# Patient Record
Sex: Male | Born: 1960 | Race: White | Hispanic: No | Marital: Married | Smoking: Current every day smoker
Health system: Southern US, Community
[De-identification: ages and names within clinical notes are randomized; demographics above are authoritative.]

## PROBLEM LIST (undated history)

## (undated) DIAGNOSIS — I10 Essential (primary) hypertension: Secondary | ICD-10-CM

## (undated) DIAGNOSIS — J939 Pneumothorax, unspecified: Secondary | ICD-10-CM

## (undated) DIAGNOSIS — E785 Hyperlipidemia, unspecified: Secondary | ICD-10-CM

## (undated) DIAGNOSIS — G44009 Cluster headache syndrome, unspecified, not intractable: Secondary | ICD-10-CM

## (undated) DIAGNOSIS — E559 Vitamin D deficiency, unspecified: Secondary | ICD-10-CM

## (undated) DIAGNOSIS — R7303 Prediabetes: Secondary | ICD-10-CM

## (undated) HISTORY — DX: Essential (primary) hypertension: I10

## (undated) HISTORY — DX: Cluster headache syndrome, unspecified, not intractable: G44.009

## (undated) HISTORY — DX: Vitamin D deficiency, unspecified: E55.9

## (undated) HISTORY — DX: Prediabetes: R73.03

## (undated) HISTORY — DX: Pneumothorax, unspecified: J93.9

## (undated) HISTORY — DX: Hyperlipidemia, unspecified: E78.5

---
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
## 2004-11-05 ENCOUNTER — Ambulatory Visit (HOSPITAL_COMMUNITY): Admission: RE | Admit: 2004-11-05 | Discharge: 2004-11-05 | Payer: Self-pay | Admitting: Internal Medicine

## 2006-02-17 HISTORY — PX: INCISE AND DRAIN ABCESS: PRO64

## 2006-04-14 ENCOUNTER — Ambulatory Visit (HOSPITAL_COMMUNITY): Admission: RE | Admit: 2006-04-14 | Discharge: 2006-04-14 | Payer: Self-pay | Admitting: Internal Medicine

## 2008-06-19 ENCOUNTER — Ambulatory Visit (HOSPITAL_COMMUNITY): Admission: RE | Admit: 2008-06-19 | Discharge: 2008-06-19 | Payer: Self-pay | Admitting: Internal Medicine

## 2010-04-28 IMAGING — CR DG CHEST 2V
2 series · 2 of 2 positions shown · non-contrast
Comparison: 04/14/2006

CLINICAL DATA: Annual physical exam.  Prior history of smoking with
cessation 3 years ago

CHEST - 2 VIEW

[view not recorded (1 of 2)]
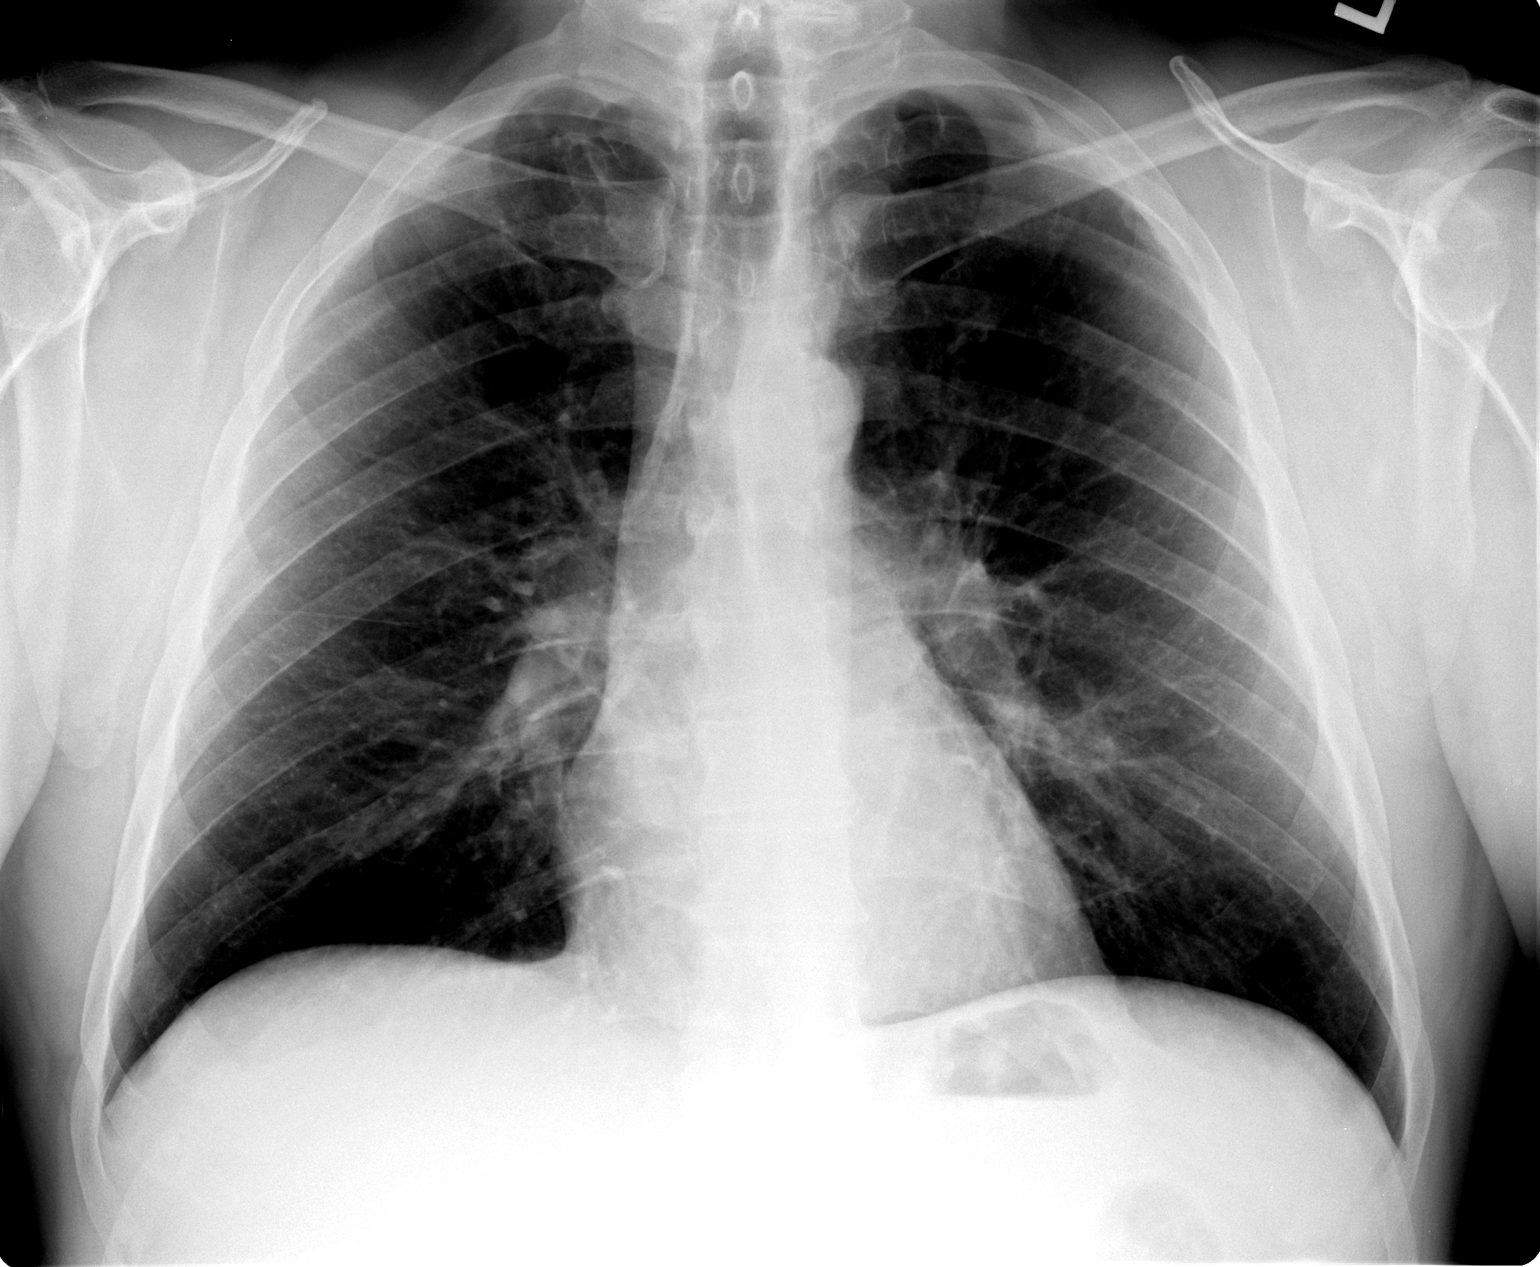

[view not recorded (2 of 2)]
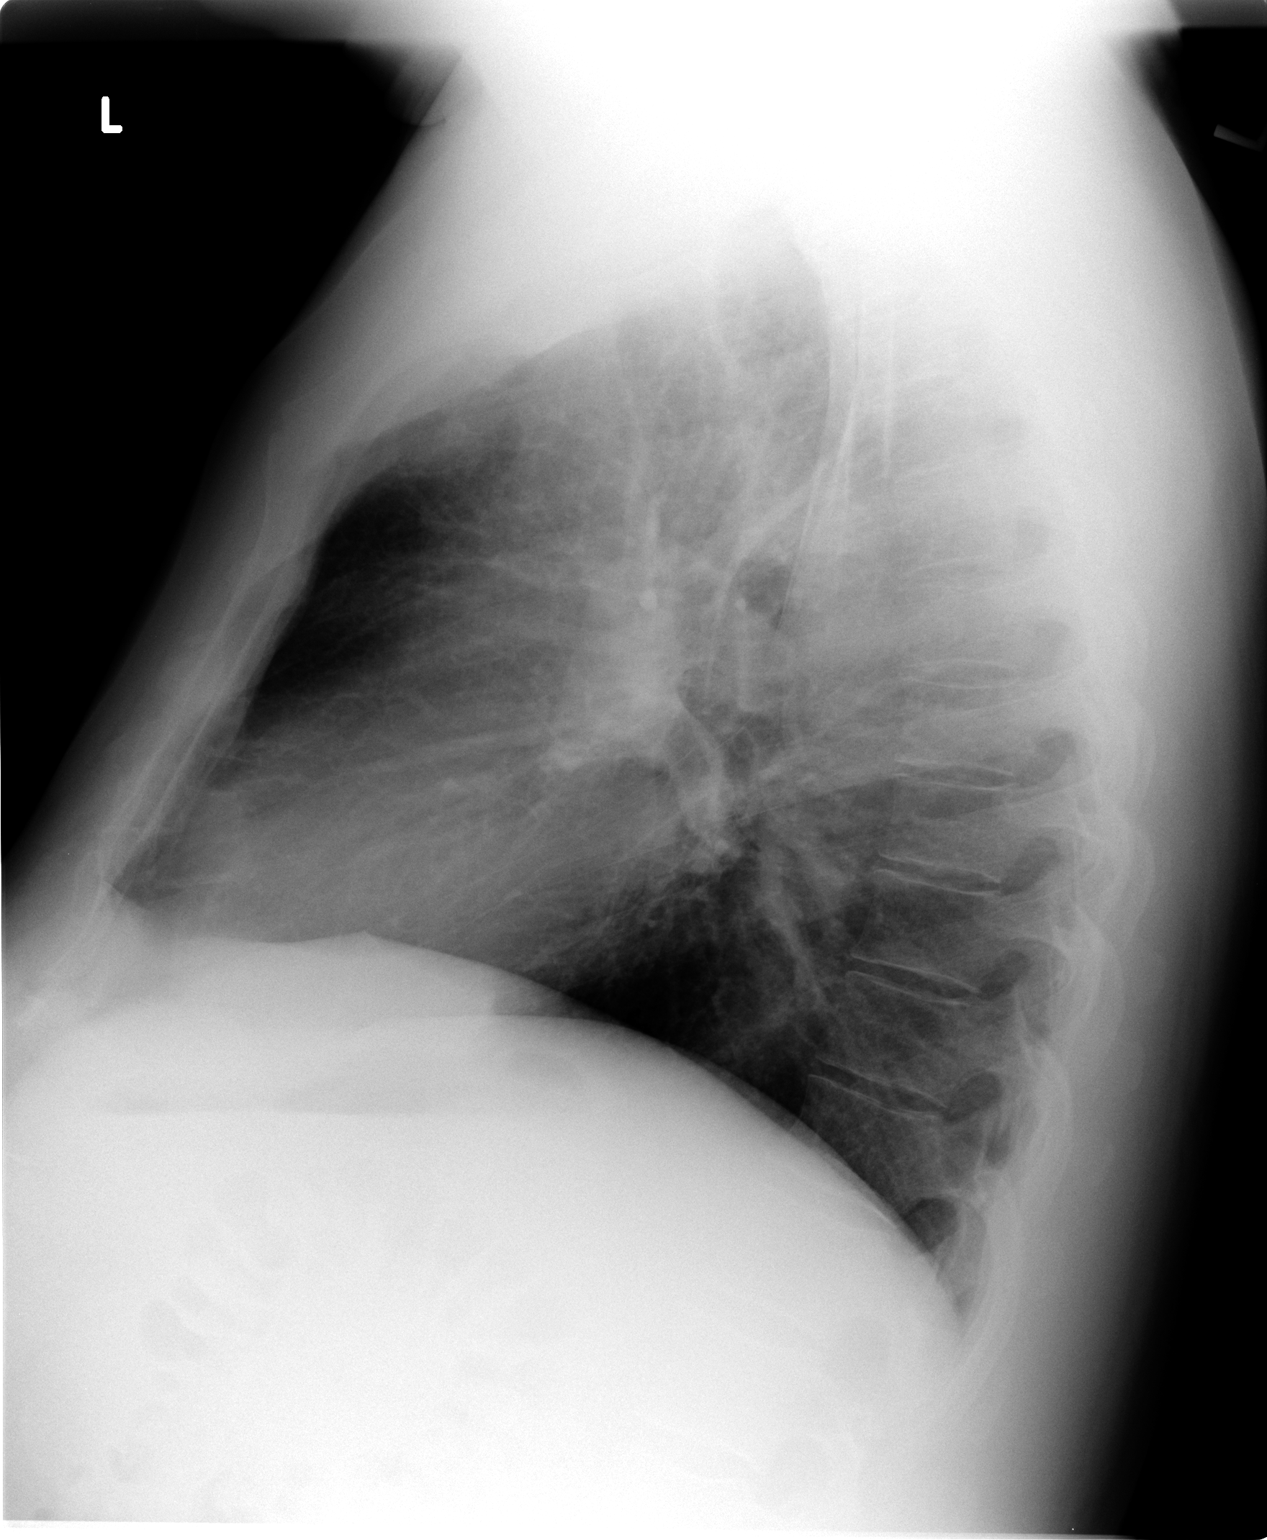

[2 of 2 positions shown; findings below may reference images not displayed]

FINDINGS: Heart and mediastinal contours are within normal limits.
The lung fields appear clear with no evidence for focal infiltrate
or congestive failure.  Bony structures appear intact.
IMPRESSION: Stable cardiopulmonary appearance with no acute disease noted.

## 2011-02-04 ENCOUNTER — Other Ambulatory Visit: Payer: Self-pay | Admitting: Internal Medicine

## 2011-02-04 DIAGNOSIS — G44009 Cluster headache syndrome, unspecified, not intractable: Secondary | ICD-10-CM

## 2011-02-05 ENCOUNTER — Ambulatory Visit
Admission: RE | Admit: 2011-02-05 | Discharge: 2011-02-05 | Disposition: A | Discharge status: Home / Self Care | Payer: BC Managed Care – PPO | Source: Ambulatory Visit | Attending: Internal Medicine | Admitting: Internal Medicine

## 2011-02-05 DIAGNOSIS — G44009 Cluster headache syndrome, unspecified, not intractable: Secondary | ICD-10-CM

## 2012-12-14 IMAGING — CT CT HEAD W/O CM
2 series · 16 of 30 positions shown, 20 images · non-contrast
Comparison: None.

CLINICAL DATA: Cluster headaches

CT HEAD WITHOUT CONTRAST
TECHNIQUE: Contiguous axial images were obtained from the base of
the skull through the vertex without contrast.

[Series 3: head bone · axial · 0.49mm/px · z∈[+40,+89]mm · 3 of 32 slices shown]
[im 3/32  bone]
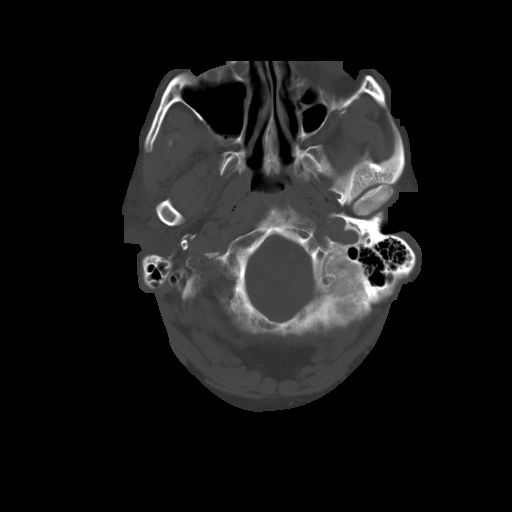
[im 7/32  bone]
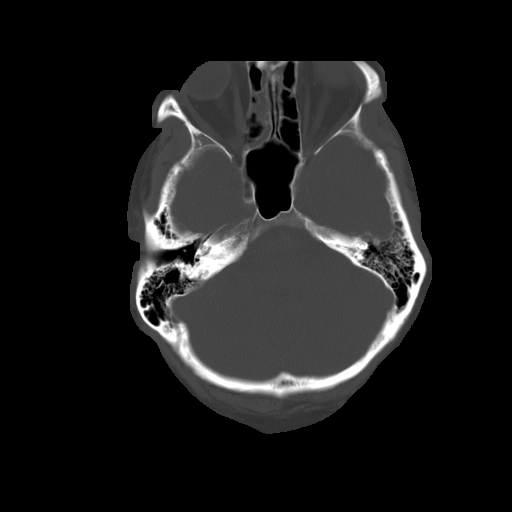
[im 12/32  bone]
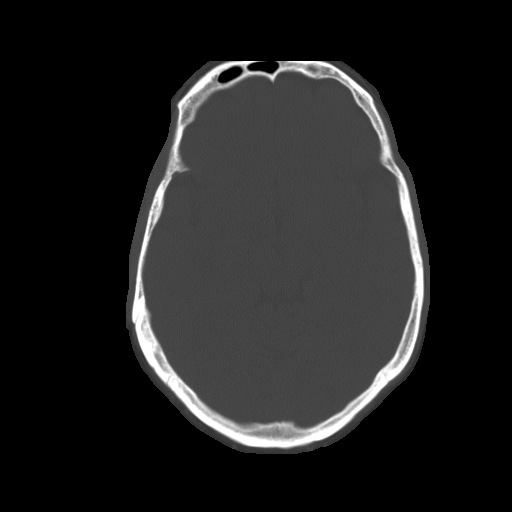

[Series 32: 3d filtered head w/o · axial · non-contrast · 0.49mm/px · z∈[+40,+182]mm · 13 of 32 slices shown, 17 images]
[im 3/32  brain]
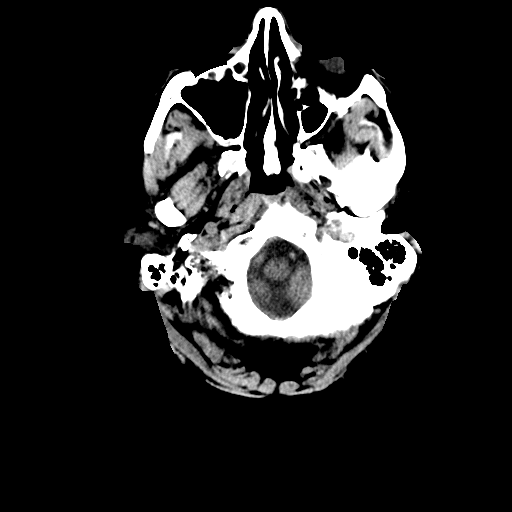
[im 3/32  bone]
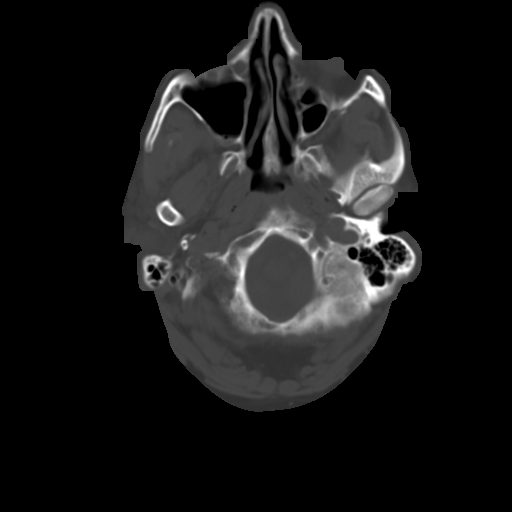
[im 5/32  brain]
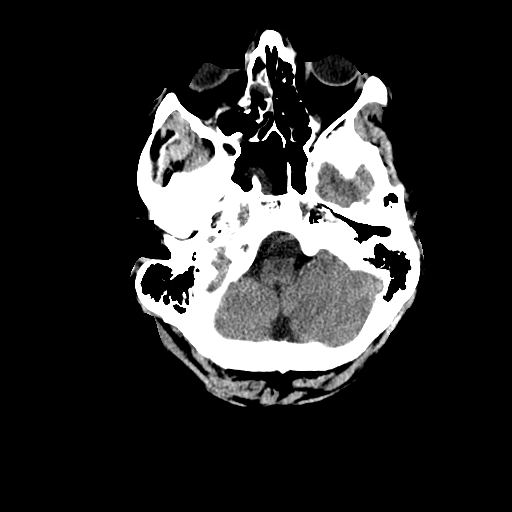
[im 7/32  brain]
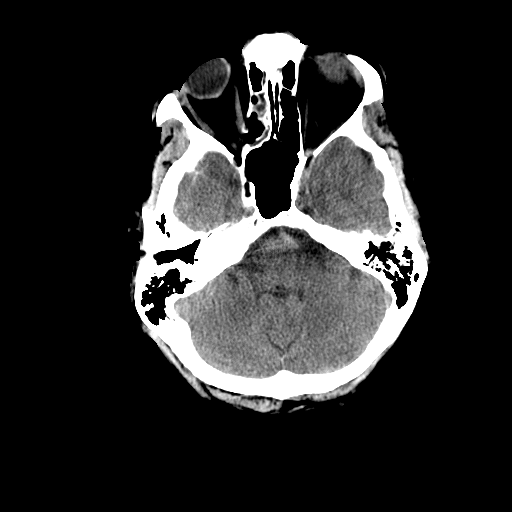
[im 9/32  brain]
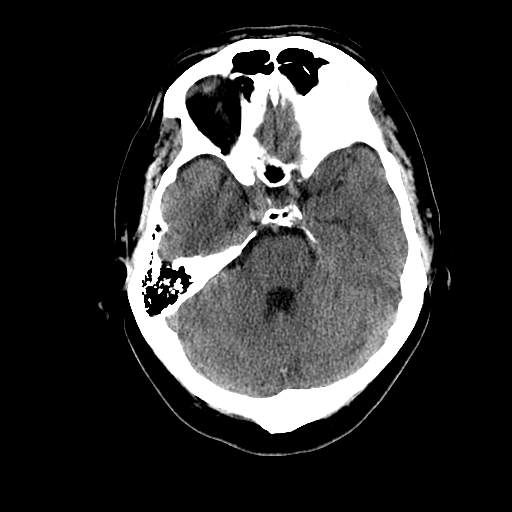
[im 12/32  brain]
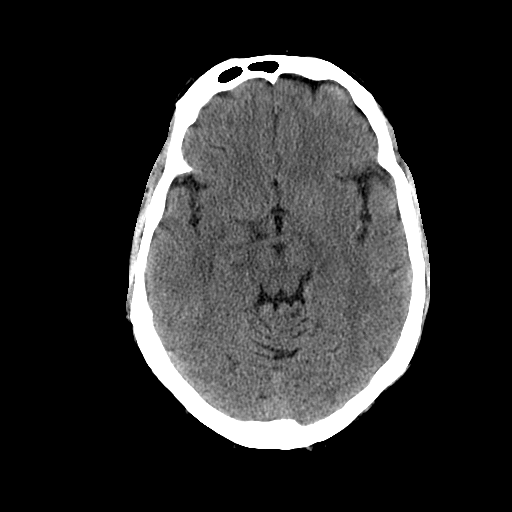
[im 12/32  bone]
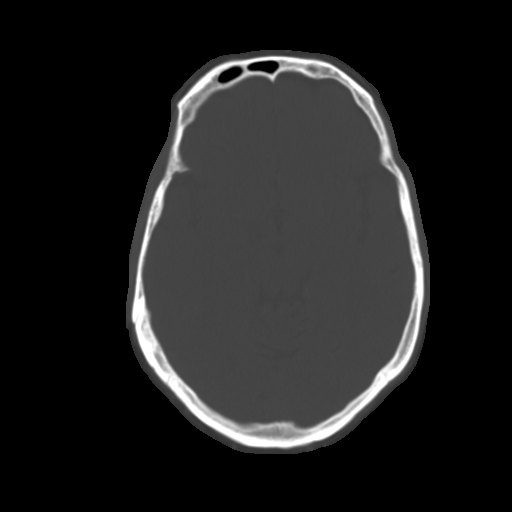
[im 14/32  brain]
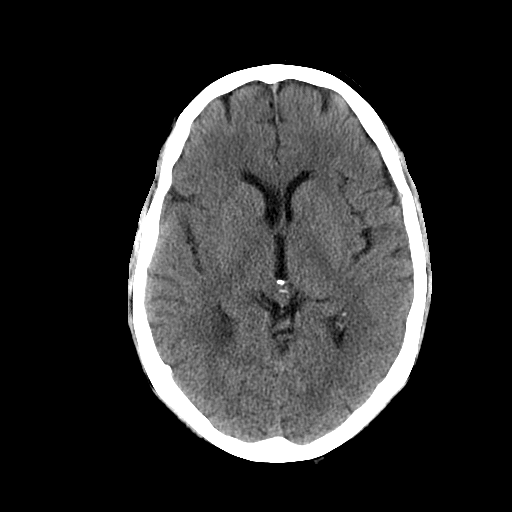
[im 16/32  brain]
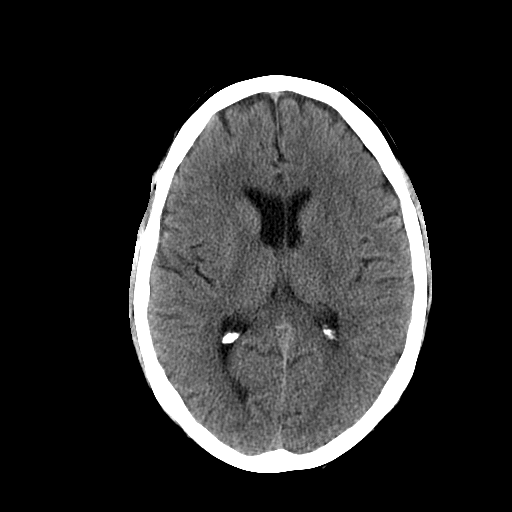
[im 18/32  brain]
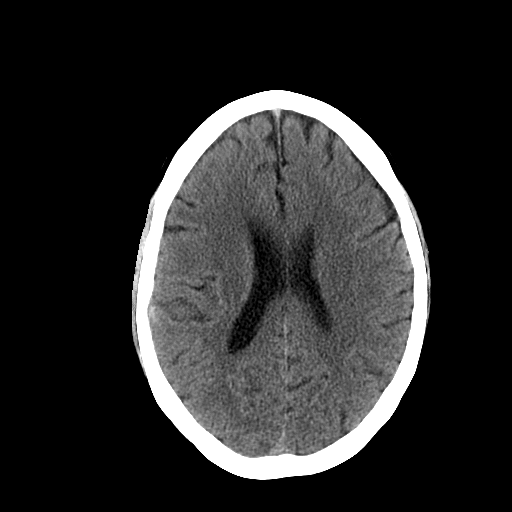
[im 20/32  brain]
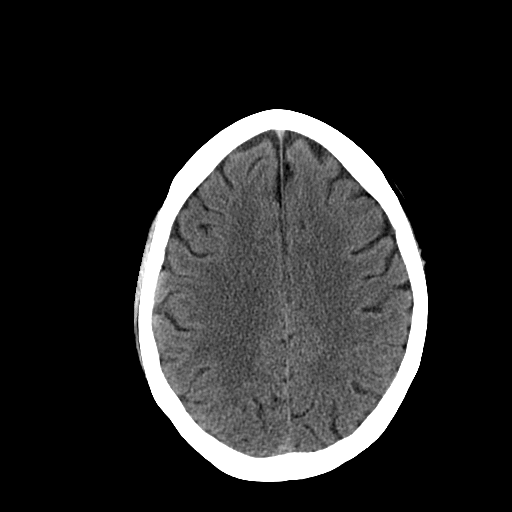
[im 20/32  bone]
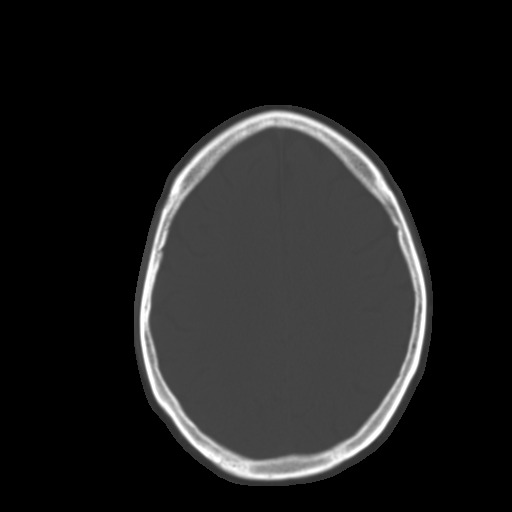
[im 23/32  brain]
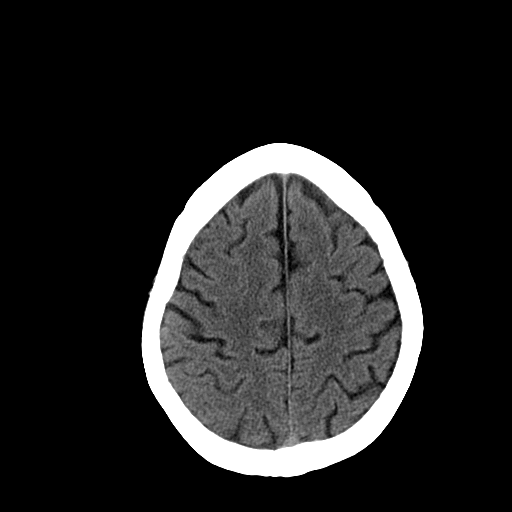
[im 25/32  brain]
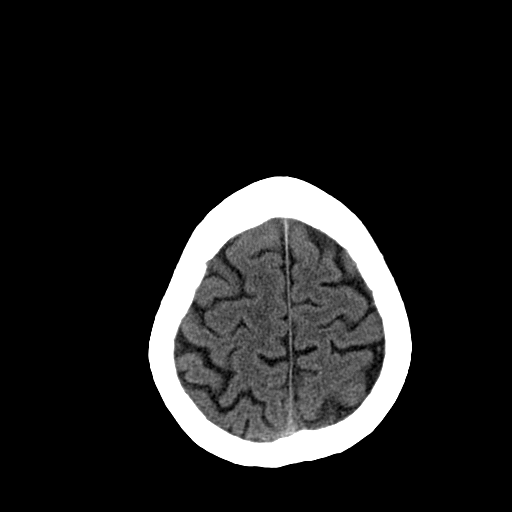
[im 27/32  brain]
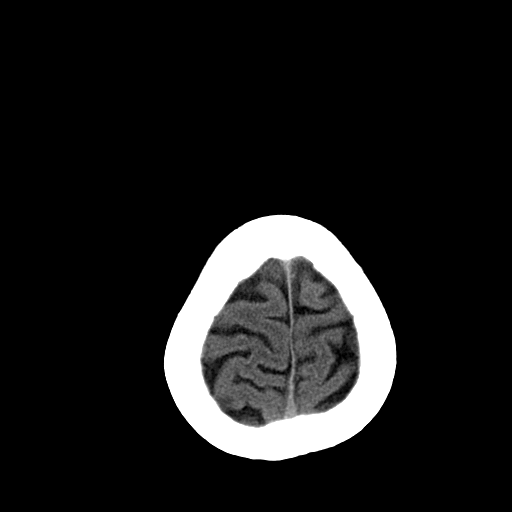
[im 29/32  brain]
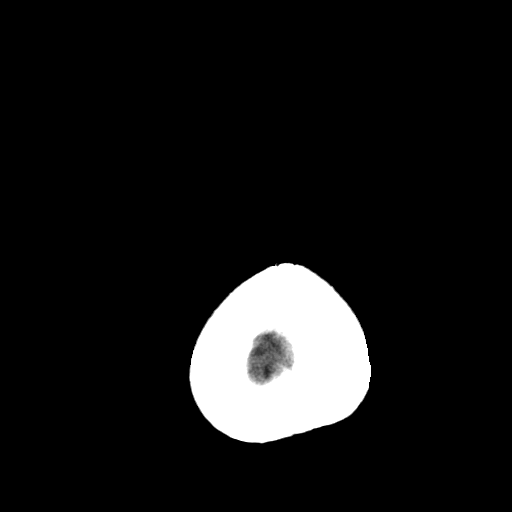
[im 29/32  bone]
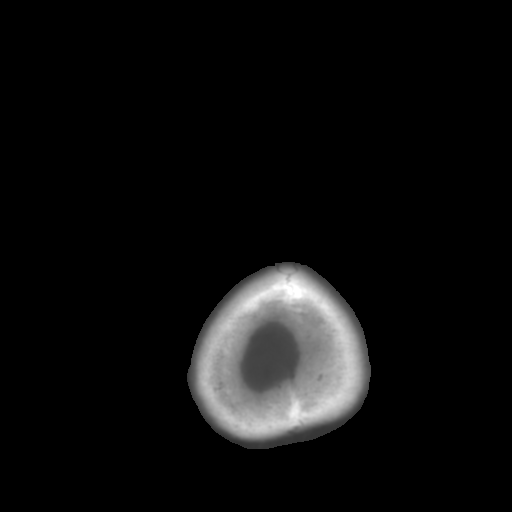

[16 of 30 positions shown; findings below may reference images not displayed]

FINDINGS: Ventricle size is normal.  Negative for intracranial
hemorrhage.  Negative for infarct or mass lesion.  Hypodensity
right basal ganglia compatible with chorioid fissure cyst.  This
appears benign.

Calvarium is intact.  Mucosal thickening in the sphenoid sinus and
right ethmoid sinus. Small air-fluid level right maxillary sinus.
IMPRESSION: No significant intracranial abnormality.

## 2013-02-07 ENCOUNTER — Other Ambulatory Visit: Payer: Self-pay | Admitting: Internal Medicine

## 2013-02-07 ENCOUNTER — Other Ambulatory Visit: Payer: Self-pay | Admitting: Physician Assistant

## 2013-02-07 MED ORDER — SILDENAFIL CITRATE 100 MG PO TABS: 100.0000 mg | ORAL_TABLET | ORAL | Status: DC | PRN

## 2013-02-07 MED ORDER — VERAPAMIL HCL ER 240 MG PO TBCR: 240.0000 mg | EXTENDED_RELEASE_TABLET | Freq: Every day | ORAL | Status: DC

## 2013-03-14 DIAGNOSIS — I10 Essential (primary) hypertension: Secondary | ICD-10-CM | POA: Insufficient documentation

## 2013-03-14 DIAGNOSIS — E785 Hyperlipidemia, unspecified: Secondary | ICD-10-CM | POA: Insufficient documentation

## 2013-03-14 DIAGNOSIS — R7309 Other abnormal glucose: Secondary | ICD-10-CM | POA: Insufficient documentation

## 2013-03-14 DIAGNOSIS — G44009 Cluster headache syndrome, unspecified, not intractable: Secondary | ICD-10-CM | POA: Insufficient documentation

## 2013-03-14 DIAGNOSIS — R7303 Prediabetes: Secondary | ICD-10-CM | POA: Insufficient documentation

## 2013-03-15 ENCOUNTER — Encounter: Payer: Self-pay | Admitting: Emergency Medicine

## 2013-03-15 ENCOUNTER — Ambulatory Visit (INDEPENDENT_AMBULATORY_CARE_PROVIDER_SITE_OTHER): Payer: BC Managed Care – PPO | Admitting: Emergency Medicine

## 2013-03-15 VITALS — BP 138/84 | HR 82 | Temp 98.4°F | Resp 18 | Ht 70.5 in | Wt 192.0 lb

## 2013-03-15 DIAGNOSIS — G4700 Insomnia, unspecified: Secondary | ICD-10-CM

## 2013-03-15 DIAGNOSIS — R5381 Other malaise: Secondary | ICD-10-CM

## 2013-03-15 DIAGNOSIS — J309 Allergic rhinitis, unspecified: Secondary | ICD-10-CM

## 2013-03-15 DIAGNOSIS — J329 Chronic sinusitis, unspecified: Secondary | ICD-10-CM

## 2013-03-15 DIAGNOSIS — R6883 Chills (without fever): Secondary | ICD-10-CM

## 2013-03-15 DIAGNOSIS — R5383 Other fatigue: Secondary | ICD-10-CM

## 2013-03-15 MED ORDER — ALPRAZOLAM 0.5 MG PO TABS: 0.5000 mg | ORAL_TABLET | Freq: Every evening | ORAL | Status: AC | PRN

## 2013-03-15 MED ORDER — AZELASTINE HCL 0.1 % NA SOLN: 1.0000 | Freq: Two times a day (BID) | NASAL | Status: DC

## 2013-03-15 MED ORDER — AZITHROMYCIN 250 MG PO TABS: ORAL_TABLET | ORAL | Status: AC

## 2013-03-15 MED ORDER — PREDNISONE 10 MG PO TABS: ORAL_TABLET | ORAL | Status: DC

## 2013-03-15 MED ORDER — FLUTICASONE PROPIONATE 50 MCG/ACT NA SUSP: 1.0000 | Freq: Every day | NASAL | Status: DC

## 2013-03-15 NOTE — Progress Notes (Signed)
Subjective:    Patient ID: Keith Castro, male    DOB: 02/06/1961, 53 y.o.   MRN: [id number]  HPI Comments: 53 yo male with Right side pain in sinus x 1week. He has increased neon yellow production. He has been on multiple nose sprays without relief. He uses afrin routinely.   He has had difficulty falling asleep and staying asleep for years. He drinks ETOH occasional. He tried Oaxzepam 30 mg in past made him too sleepy. He takes occasionally xanax .5 from wife which helps and does not leave him groggy in the a.m.     Medication List       This list is accurate as of: 03/15/13 11:59 PM.  Always use your most recent med list.               ALPRAZolam 0.5 MG tablet  Commonly known as:  XANAX  Take 1 tablet (0.5 mg total) by mouth at bedtime as needed for anxiety or sleep.     azelastine 137 MCG/SPRAY nasal spray  Commonly known as:  ASTELIN  Place 1 spray into both nostrils 2 (two) times daily. Use in each nostril as directed     azithromycin 250 MG tablet  Commonly known as:  ZITHROMAX  Take 2 tablets (500 mg) on  Day 1,  followed by 1 tablet (250 mg) once daily on Days 2 through 5.     FISH OIL PO  Take by mouth daily.     FLAXSEED OIL PO  Take by mouth daily.     fluticasone 50 MCG/ACT nasal spray  Commonly known as:  FLONASE  Place 1 spray into both nostrils daily.     predniSONE 10 MG tablet  Commonly known as:  DELTASONE  1 po TID x 3 days, 1 PO BID x 3 days, 1 po QD x 5 days     RED YEAST RICE PO  Take by mouth daily.     sildenafil 100 MG tablet  Commonly known as:  VIAGRA  Take 1 tablet (100 mg total) by mouth as needed for erectile dysfunction.     SUMAtriptan 20 MG/ACT nasal spray  Commonly known as:  IMITREX  Place 20 mg into the nose every 2 (two) hours as needed for migraine or headache. May repeat in 2 hours if headache persists or recurs.     verapamil 240 MG CR tablet  Commonly known as:  CALAN-SR  Take 1 tablet (240 mg total) by mouth  daily.     VITAMIN D PO  Take 6,000 Int'l Units by mouth daily.     ZYRTEC ALLERGY 10 MG tablet  Generic drug:  cetirizine  Take 10 mg by mouth daily.       ALLERGIES Topamax  Past Medical History  Diagnosis Date  . Hyperlipidemia   . Hypertension   . Prediabetes   . Vitamin D deficiency   . Cluster headache      Review of Systems  Constitutional: Positive for fever.  HENT: Positive for congestion and sinus pressure.   Respiratory: Positive for cough.   Psychiatric/Behavioral: Positive for sleep disturbance.  All other systems reviewed and are negative.   BP 138/84  Pulse 82  Temp(Src) 98.4 F (36.9 C) (Temporal)  Resp 18  Ht 5' 10.5" (1.791 m)  Wt 192 lb (87.091 kg)  BMI 27.15 kg/m2     Objective:   Physical Exam  Nursing note and vitals reviewed. Constitutional: He is oriented to person, place, and  time. He appears well-developed and well-nourished.  HENT:  Head: Normocephalic and atraumatic.  Right Ear: External ear normal.  Left Ear: External ear normal.  Nose: Nose normal.  Mouth/Throat: Oropharynx is clear and moist. No oropharyngeal exudate.  Right maxillary/ frontal tenderness and yellow TM  Eyes: Conjunctivae are normal.  Neck: Normal range of motion.  Cardiovascular: Normal rate, regular rhythm, normal heart sounds and intact distal pulses.   Pulmonary/Chest: Effort normal and breath sounds normal.  Abdominal: Soft.  Musculoskeletal: Normal range of motion.  Lymphadenopathy:    He has no cervical adenopathy.  Neurological: He is alert and oriented to person, place, and time.  Skin: Skin is warm and dry.  Psychiatric: He has a normal mood and affect. Judgment normal.          Assessment & Plan:  1. Sinusitis/ Allergic rhinitis-  Switch to Allegra OTC, increase H2o, allergy hygiene explained. Zpak, Pred dp 10mg  AD. Astepro/ Flonase NS AD, D/C Afrin 2. Insomnia- Sleep hygiene discussed, increase daytime activity level call if no  improvement, consider sleep study Xanax .5 for sleep AD, do not take with ETOH or other pain Rx

## 2013-03-15 NOTE — Patient Instructions (Signed)
Allergic Rhinitis Allergic rhinitis is when the mucous membranes in the nose respond to allergens. Allergens are particles in the air that cause your body to have an allergic reaction. This causes you to release allergic antibodies. Through a chain of events, these eventually cause you to release histamine into the blood stream. Although meant to protect the body, it is this release of histamine that causes your discomfort, such as frequent sneezing, congestion, and an itchy, runny nose.  CAUSES  Seasonal allergic rhinitis (hay fever) is caused by pollen allergens that may come from grasses, trees, and weeds. Year-round allergic rhinitis (perennial allergic rhinitis) is caused by allergens such as house dust mites, pet dander, and mold spores.  SYMPTOMS   Nasal stuffiness (congestion).  Itchy, runny nose with sneezing and tearing of the eyes. DIAGNOSIS  Your health care provider can help you determine the allergen or allergens that trigger your symptoms. If you and your health care provider are unable to determine the allergen, skin or blood testing may be used. TREATMENT  Allergic Rhinitis does not have a cure, but it can be controlled by:  Medicines and allergy shots (immunotherapy).  Avoiding the allergen. Hay fever may often be treated with antihistamines in pill or nasal spray forms. Antihistamines block the effects of histamine. There are over-the-counter medicines that may help with nasal congestion and swelling around the eyes. Check with your health care provider before taking or giving this medicine.  If avoiding the allergen or the medicine prescribed do not work, there are many new medicines your health care provider can prescribe. Stronger medicine may be used if initial measures are ineffective. Desensitizing injections can be used if medicine and avoidance does not work. Desensitization is when a patient is given ongoing shots until the body becomes less sensitive to the allergen.  Make sure you follow up with your health care provider if problems continue. HOME CARE INSTRUCTIONS It is not possible to completely avoid allergens, but you can reduce your symptoms by taking steps to limit your exposure to them. It helps to know exactly what you are allergic to so that you can avoid your specific triggers. SEEK MEDICAL CARE IF:   You have a fever.  You develop a cough that does not stop easily (persistent).  You have shortness of breath.  You start wheezing.  Symptoms interfere with normal daily activities. Document Released: 10/29/2000 Document Revised: 11/24/2012 Document Reviewed: 10/11/2012 [employer] Patient Information 2014 [employer]. Sinusitis Sinusitis is redness, soreness, and puffiness (inflammation) of the air pockets in the bones of your face (sinuses). The redness, soreness, and puffiness can cause air and mucus to get trapped in your sinuses. This can allow germs to grow and cause an infection.  HOME CARE   Drink enough fluids to keep your pee (urine) clear or pale yellow.  Use a humidifier in your home.  Run a hot shower to create steam in the bathroom. Sit in the bathroom with the door closed. Breathe in the steam 3 4 times a day.  Put a warm, moist washcloth on your face 3 4 times a day, or as told by your doctor.  Use salt water sprays (saline sprays) to wet the thick fluid in your nose. This can help the sinuses drain.  Only take medicine as told by your doctor. GET HELP RIGHT AWAY IF:   Your pain gets worse.  You have very bad headaches.  You are sick to your stomach (nauseous).  You throw up (vomit).    You are very sleepy (drowsy) all the time.  Your face is puffy (swollen).  Your vision changes.  You have a stiff neck.  You have trouble breathing. MAKE SURE YOU:   Understand these instructions.  Will watch your condition.  Will get help right away if you are not doing well or get worse. Document Released: 07/23/2007  Document Revised: 10/29/2011 Document Reviewed: 09/09/2011 [employer] Patient Information 2014 [employer].  

## 2013-03-31 ENCOUNTER — Ambulatory Visit (INDEPENDENT_AMBULATORY_CARE_PROVIDER_SITE_OTHER): Payer: BC Managed Care – PPO | Admitting: Internal Medicine

## 2013-03-31 ENCOUNTER — Encounter: Payer: Self-pay | Admitting: Internal Medicine

## 2013-03-31 VITALS — BP 124/80 | HR 72 | Temp 98.6°F | Resp 16 | Wt 199.6 lb

## 2013-03-31 DIAGNOSIS — R7303 Prediabetes: Secondary | ICD-10-CM

## 2013-03-31 DIAGNOSIS — R7309 Other abnormal glucose: Secondary | ICD-10-CM

## 2013-03-31 DIAGNOSIS — E785 Hyperlipidemia, unspecified: Secondary | ICD-10-CM

## 2013-03-31 DIAGNOSIS — I10 Essential (primary) hypertension: Secondary | ICD-10-CM

## 2013-03-31 DIAGNOSIS — E559 Vitamin D deficiency, unspecified: Secondary | ICD-10-CM

## 2013-03-31 DIAGNOSIS — Z79899 Other long term (current) drug therapy: Secondary | ICD-10-CM | POA: Insufficient documentation

## 2013-03-31 DIAGNOSIS — E782 Mixed hyperlipidemia: Secondary | ICD-10-CM

## 2013-03-31 LAB — CBC WITH DIFFERENTIAL/PLATELET
BASOS ABS: 0.1 10*3/uL (ref 0.0–0.1)
Basophils Relative: 1 % (ref 0–1)
EOS ABS: 0.1 10*3/uL (ref 0.0–0.7)
EOS PCT: 2 % (ref 0–5)
HCT: 42.5 % (ref 39.0–52.0)
HEMOGLOBIN: 14.9 g/dL (ref 13.0–17.0)
Lymphocytes Relative: 26 % (ref 12–46)
Lymphs Abs: 2.1 10*3/uL (ref 0.7–4.0)
MCH: 32.5 pg (ref 26.0–34.0)
MCHC: 35.1 g/dL (ref 30.0–36.0)
MCV: 92.6 fL (ref 78.0–100.0)
Monocytes Absolute: 1 10*3/uL (ref 0.1–1.0)
Monocytes Relative: 12 % (ref 3–12)
Neutro Abs: 4.8 10*3/uL (ref 1.7–7.7)
Neutrophils Relative %: 59 % (ref 43–77)
Platelets: 323 10*3/uL (ref 150–400)
RBC: 4.59 MIL/uL (ref 4.22–5.81)
RDW: 13.3 % (ref 11.5–15.5)
WBC: 8.1 10*3/uL (ref 4.0–10.5)

## 2013-03-31 NOTE — Progress Notes (Signed)
Patient ID: Keith Castro FindersBobby Castro, male   DOB: 07/01/1960, 53 y.o.   MRN: [id number]   This very nice 53 y.o. MWM presents for 3 month follow up with Hypertension, Hyperlipidemia, Pre-Diabetes and Vitamin D Deficiency.    HTN predates since 2004. BP has been controlled at home. Today's BP: 124/80 mmHg . Patient denies any cardiac type chest pain, palpitations, dyspnea/orthopnea/PND, dizziness, claudication, or dependent edema.   Hyperlipidemia is controlled with diet & meds. Last Cholesterol was 172, Triglycerides were 241, HDL 48 and LDL 76 in Aug 2014 -0 at goal. Patient denies myalgias or other med SE's.   Also, the patient has history of PreDiabetes with A1c 5.7% in 2011 and with last A1c of 5.3% in Aug 2014   . Patient denies any symptoms of reactive hypoglycemia, diabetic polys, paresthesias or visual blurring.   Further, Patient has history of Vitamin D Deficiency of 4333 in 2008and with last vitamin D of 100 in Aug 2014. Patient supplements vitamin D without any suspected side-effects.    Medication List       ALPRAZolam 0.5 MG tablet  Commonly known as:  XANAX  Take 1 tablet (0.5 mg total) by mouth at bedtime as needed for anxiety or sleep.     aspirin 325 MG tablet  Take 325 mg by mouth daily.     azelastine 137 MCG/SPRAY nasal spray  Commonly known as:  ASTELIN  Place 1 spray into both nostrils 2 (two) times daily. Use in each nostril as directed     FISH OIL PO  Take by mouth daily.     FLAXSEED OIL PO  Take by mouth daily.     fluticasone 50 MCG/ACT nasal spray  Commonly known as:  FLONASE  Place 1 spray into both nostrils daily.     meloxicam 15 MG tablet  Commonly known as:  MOBIC  Take 15 mg by mouth daily.     RED YEAST RICE PO  Take by mouth daily.     sildenafil 100 MG tablet  Commonly known as:  VIAGRA  Take 1 tablet (100 mg total) by mouth as needed for erectile dysfunction.     SUMAtriptan 20 MG/ACT nasal spray  Commonly known as:  IMITREX  Place 20 mg  into the nose every 2 (two) hours as needed for migraine or headache. May repeat in 2 hours if headache persists or recurs.     verapamil 240 MG CR tablet  Commonly known as:  CALAN-SR  Take 1 tablet (240 mg total) by mouth daily.     VITAMIN D PO  Take 6,000 Int'l Units by mouth daily.     ZYRTEC ALLERGY 10 MG tablet  Generic drug:  cetirizine  Take 10 mg by mouth daily.         Allergies  Allergen Reactions  . Topamax [Topiramate]     PMHx:   Past Medical History  Diagnosis Date  . Hyperlipidemia   . Hypertension   . Prediabetes   . Vitamin D deficiency   . Cluster headache     FHx:    Reviewed / unchanged  SHx:    Reviewed / unchanged  Systems Review: Constitutional: Denies fever, chills, wt changes, headaches, insomnia, fatigue, night sweats, change in appetite. Eyes: Denies redness, blurred vision, diplopia, discharge, itchy, watery eyes.  ENT: Denies discharge, congestion, post nasal drip, epistaxis, sore throat, earache, hearing loss, dental pain, tinnitus, vertigo, sinus pain, snoring.  CV: Denies chest pain, palpitations, irregular heartbeat, syncope, dyspnea, diaphoresis,  orthopnea, PND, claudication, edema. Respiratory: denies cough, dyspnea, DOE, pleurisy, hoarseness, laryngitis, wheezing.  Gastrointestinal: Denies dysphagia, odynophagia, heartburn, reflux, water brash, abdominal pain or cramps, nausea, vomiting, bloating, diarrhea, constipation, hematemesis, melena, hematochezia,  or hemorrhoids. Genitourinary: Denies dysuria, frequency, urgency, nocturia, hesitancy, discharge, hematuria, flank pain. Musculoskeletal: Denies arthralgias, myalgias, stiffness, jt. swelling, pain, limp, strain/sprain.  Skin: Denies pruritus, rash, hives, warts, acne, eczema, change in skin lesion(s). Neuro: No weakness, tremor, incoordination, spasms, paresthesia, or pain. Psychiatric: Denies confusion, memory loss, or sensory loss. Endo: Denies change in weight, skin, hair  change.  Heme/Lymph: No excessive bleeding, bruising, orenlarged lymph nodes.  BP: 124/80  Pulse: 72  Temp: 98.6 F (37 C)  Resp: 16    Estimated body mass index is 28.23 kg/(m^2) as calculated from the following:   Height as of 03/15/13: 5' 10.5" (1.791 m).   Weight as of this encounter: 199 lb 9.6 oz (90.538 kg).  On Exam: Appears well nourished - in no distress. Eyes: PERRLA, EOMs, conjunctiva no swelling or erythema. Sinuses: No frontal/maxillary tenderness ENT/Mouth: EAC's clear, TM's nl w/o erythema, bulging. Nares clear w/o erythema, swelling, exudates. Oropharynx clear without erythema or exudates. Oral hygiene is good. Tongue normal, non obstructing. Hearing intact.  Neck: Supple. Thyroid nl. Car 2+/2+ without bruits, nodes or JVD. Chest: Respirations nl with BS clear & equal w/o rales, rhonchi, wheezing or stridor.  Cor: Heart sounds normal w/ regular rate and rhythm without sig. murmurs, gallops, clicks, or rubs. Peripheral pulses normal and equal  without edema.  Abdomen: Soft & bowel sounds normal. Non-tender w/o guarding, rebound, hernias, masses, or organomegaly.  Lymphatics: Unremarkable.  Musculoskeletal: Full ROM all peripheral extremities, joint stability, 5/5 strength, and normal gait.  Skin: Warm, dry without exposed rashes, lesions, ecchymosis apparent.  Neuro: Cranial nerves intact, reflexes equal bilaterally. Sensory-motor testing grossly intact. Tendon reflexes grossly intact.  Pysch: Alert & oriented x 3. Insight and judgement nl & appropriate. No ideations.  Assessment and Plan:  1. Hypertension - Continue monitor blood pressure at home. Continue diet/meds same.  2. Hyperlipidemia - Continue diet/meds, exercise,& lifestyle modifications. Continue monitor periodic cholesterol/liver & renal functions   3. Pre-diabetes - Continue diet, exercise, lifestyle modifications. Monitor appropriate labs.  4. Vitamin D Deficiency - Continue  supplementation.  Recommended regular exercise, BP monitoring, weight control, and discussed med and SE's. Recommended labs to assess and monitor clinical status. Further disposition pending results of labs.

## 2013-03-31 NOTE — Patient Instructions (Signed)

## 2013-04-01 LAB — VITAMIN D 25 HYDROXY (VIT D DEFICIENCY, FRACTURES): Vit D, 25-Hydroxy: 100 ng/mL — ABNORMAL HIGH (ref 30–89)

## 2013-04-01 LAB — LIPID PANEL
CHOLESTEROL: 185 mg/dL (ref 0–200)
HDL: 49 mg/dL (ref >39)
LDL Cholesterol: 97 mg/dL (ref 0–99)
Total CHOL/HDL Ratio: 3.8 Ratio
Triglycerides: 196 mg/dL — ABNORMAL HIGH (ref <150)
VLDL: 39 mg/dL (ref 0–40)

## 2013-04-01 LAB — TSH: TSH: 3.843 u[IU]/mL (ref 0.350–4.500)

## 2013-04-01 LAB — HEPATIC FUNCTION PANEL
ALBUMIN: 4.5 g/dL (ref 3.5–5.2)
ALK PHOS: 59 U/L (ref 39–117)
ALT: 54 U/L — ABNORMAL HIGH (ref 0–53)
AST: 26 U/L (ref 0–37)
BILIRUBIN DIRECT: 0.1 mg/dL (ref 0.0–0.3)
BILIRUBIN INDIRECT: 0.7 mg/dL (ref 0.2–1.2)
BILIRUBIN TOTAL: 0.8 mg/dL (ref 0.2–1.2)
Total Protein: 6.8 g/dL (ref 6.0–8.3)

## 2013-04-01 LAB — BASIC METABOLIC PANEL WITH GFR
BUN: 15 mg/dL (ref 6–23)
CALCIUM: 9.6 mg/dL (ref 8.4–10.5)
CO2: 23 mEq/L (ref 19–32)
CREATININE: 0.92 mg/dL (ref 0.50–1.35)
Chloride: 107 mEq/L (ref 96–112)
GFR, Est African American: >89 mL/min
GLUCOSE: 72 mg/dL (ref 70–99)
Potassium: 4.2 mEq/L (ref 3.5–5.3)
SODIUM: 139 meq/L (ref 135–145)

## 2013-04-01 LAB — INSULIN, FASTING: Insulin fasting, serum: 11 u[IU]/mL (ref 3–28)

## 2013-04-01 LAB — HEMOGLOBIN A1C
Hgb A1c MFr Bld: 5.7 % — ABNORMAL HIGH (ref <5.7)
MEAN PLASMA GLUCOSE: 117 mg/dL — AB (ref <117)

## 2013-04-01 LAB — MAGNESIUM: MAGNESIUM: 2.1 mg/dL (ref 1.5–2.5)

## 2013-07-08 ENCOUNTER — Other Ambulatory Visit: Payer: Self-pay | Admitting: Internal Medicine

## 2013-07-08 MED ORDER — VERAPAMIL HCL ER 240 MG PO TBCR: 240.0000 mg | EXTENDED_RELEASE_TABLET | Freq: Every day | ORAL | Status: DC

## 2013-07-13 ENCOUNTER — Other Ambulatory Visit: Payer: Self-pay | Admitting: *Deleted

## 2013-07-13 MED ORDER — VERAPAMIL HCL ER 240 MG PO TBCR
240.0000 mg | EXTENDED_RELEASE_TABLET | Freq: Every day | ORAL | Status: DC
Start: 2013-07-13 — End: 2014-03-01

## 2013-09-05 ENCOUNTER — Ambulatory Visit (INDEPENDENT_AMBULATORY_CARE_PROVIDER_SITE_OTHER): Payer: BC Managed Care – PPO | Admitting: Physician Assistant

## 2013-09-05 ENCOUNTER — Encounter: Payer: Self-pay | Admitting: Physician Assistant

## 2013-09-05 VITALS — BP 110/60 | HR 60 | Temp 97.9°F | Resp 16 | Wt 200.0 lb

## 2013-09-05 DIAGNOSIS — J0100 Acute maxillary sinusitis, unspecified: Secondary | ICD-10-CM

## 2013-09-05 MED ORDER — HYDROCODONE-ACETAMINOPHEN 5-325 MG PO TABS: 1.0000 | ORAL_TABLET | Freq: Four times a day (QID) | ORAL | Status: DC | PRN

## 2013-09-05 MED ORDER — AZITHROMYCIN 250 MG PO TABS: ORAL_TABLET | ORAL | Status: DC

## 2013-09-05 MED ORDER — PREDNISONE 20 MG PO TABS: ORAL_TABLET | ORAL | Status: DC

## 2013-09-05 NOTE — Patient Instructions (Signed)
Sinus Headache °A sinus headache is when your sinuses become clogged or swollen. Sinus headaches can range from mild to severe.  °CAUSES °A sinus headache can have different causes, such as: °· Colds. °· Sinus infections. °· Allergies. °SYMPTOMS  °Symptoms of a sinus headache may vary and can include: °· Headache. °· Pain or pressure in the face. °· Congested or runny nose. °· Fever. °· Inability to smell. °· Pain in upper teeth. °Weather changes can make symptoms worse. °TREATMENT  °The treatment of a sinus headache depends on the cause. °· Sinus pain caused by a sinus infection may be treated with antibiotic medicine. °· Sinus pain caused by allergies may be helped by allergy medicines (antihistamines) and medicated nasal sprays. °· Sinus pain caused by congestion may be helped by flushing the nose and sinuses with saline solution. °HOME CARE INSTRUCTIONS  °· If antibiotics are prescribed, take them as directed. Finish them even if you start to feel better. °· Only take over-the-counter or prescription medicines for pain, discomfort, or fever as directed by your caregiver. °· If you have congestion, use a nasal spray to help reduce pressure. °SEEK IMMEDIATE MEDICAL CARE IF: °· You have a fever. °· You have headaches more than once a week. °· You have sensitivity to light or sound. °· You have repeated nausea and vomiting. °· You have vision problems. °· You have sudden, severe pain in your face or head. °· You have a seizure. °· You are confused. °· Your sinus headaches do not get better after treatment. Many people think they have a sinus headache when they actually have migraines or tension headaches. °MAKE SURE YOU:  °· Understand these instructions. °· Will watch your condition. °· Will get help right away if you are not doing well or get worse. °Document Released: 03/13/2004 Document Revised: 04/28/2011 Document Reviewed: 05/04/2010 °[employer]® Patient Information ©2015 [employer]. This information is not  intended to replace advice given to you by your health care provider. Make sure you discuss any questions you have with your health care provider. ° °

## 2013-09-05 NOTE — Progress Notes (Signed)
   Subjective:    Patient ID: Keith Castro, male    DOB: 12/07/1960, 53 y.o.   MRN: [id number]  Sinus Problem This is a new problem. Episode onset: 1 week. The problem is unchanged. There has been no fever. Associated symptoms include congestion, coughing, headaches and sinus pressure. Pertinent negatives include no chills, diaphoresis, ear pain, hoarse voice, neck pain, shortness of breath, sneezing, sore throat or swollen glands. Past treatments include oral decongestants (NSAIDS). The treatment provided no relief.      Review of Systems  Constitutional: Negative for fever, chills and diaphoresis.  HENT: Positive for congestion and sinus pressure. Negative for ear pain, hoarse voice, sneezing, sore throat, trouble swallowing and voice change.   Eyes: Negative.   Respiratory: Positive for cough. Negative for chest tightness, shortness of breath and wheezing.   Cardiovascular: Negative.   Gastrointestinal: Negative.   Genitourinary: Negative.   Musculoskeletal: Negative for neck pain.  Neurological: Positive for headaches.       Objective:   Physical Exam  Constitutional: He appears well-developed and well-nourished.  HENT:  Head: Normocephalic and atraumatic.  Right Ear: External ear normal.  Left Ear: External ear normal.  Nose: Right sinus exhibits frontal sinus tenderness. Left sinus exhibits frontal sinus tenderness.  Mouth/Throat: Oropharynx is clear and moist.  Eyes: Conjunctivae are normal. Pupils are equal, round, and reactive to light.  Neck: Normal range of motion. Neck supple.  Cardiovascular: Normal rate, regular rhythm and normal heart sounds.   Pulmonary/Chest: Effort normal and breath sounds normal. He has no wheezes.  Abdominal: Soft. Bowel sounds are normal.  Lymphadenopathy:    He has no cervical adenopathy.  Skin: Skin is warm and dry.        Assessment & Plan:  Acute maxillary sinusitis, recurrence not specified [461.0] - Plan: azithromycin  (ZITHROMAX) 250 MG tablet, predniSONE (DELTASONE) 20 MG tablet, HYDROcodone-acetaminophen (NORCO) 5-325 MG per tablet

## 2013-09-16 ENCOUNTER — Other Ambulatory Visit: Payer: Self-pay | Admitting: *Deleted

## 2013-09-16 MED ORDER — MELOXICAM 15 MG PO TABS: 15.0000 mg | ORAL_TABLET | Freq: Every day | ORAL | Status: DC

## 2013-09-26 ENCOUNTER — Ambulatory Visit (INDEPENDENT_AMBULATORY_CARE_PROVIDER_SITE_OTHER): Payer: BC Managed Care – PPO | Admitting: Internal Medicine

## 2013-09-26 ENCOUNTER — Encounter: Payer: Self-pay | Admitting: Internal Medicine

## 2013-09-26 VITALS — BP 126/80 | HR 60 | Temp 98.2°F | Resp 18 | Ht 70.5 in | Wt 203.0 lb

## 2013-09-26 DIAGNOSIS — R7402 Elevation of levels of lactic acid dehydrogenase (LDH): Secondary | ICD-10-CM

## 2013-09-26 DIAGNOSIS — Z79899 Other long term (current) drug therapy: Secondary | ICD-10-CM

## 2013-09-26 DIAGNOSIS — R7401 Elevation of levels of liver transaminase levels: Secondary | ICD-10-CM

## 2013-09-26 DIAGNOSIS — Z0000 Encounter for general adult medical examination without abnormal findings: Secondary | ICD-10-CM

## 2013-09-26 DIAGNOSIS — Z111 Encounter for screening for respiratory tuberculosis: Secondary | ICD-10-CM

## 2013-09-26 DIAGNOSIS — Z113 Encounter for screening for infections with a predominantly sexual mode of transmission: Secondary | ICD-10-CM

## 2013-09-26 DIAGNOSIS — E559 Vitamin D deficiency, unspecified: Secondary | ICD-10-CM

## 2013-09-26 DIAGNOSIS — Z1212 Encounter for screening for malignant neoplasm of rectum: Secondary | ICD-10-CM

## 2013-09-26 DIAGNOSIS — R740 Nonspecific elevation of levels of transaminase and lactic acid dehydrogenase [LDH]: Secondary | ICD-10-CM

## 2013-09-26 DIAGNOSIS — Z125 Encounter for screening for malignant neoplasm of prostate: Secondary | ICD-10-CM

## 2013-09-26 DIAGNOSIS — I10 Essential (primary) hypertension: Secondary | ICD-10-CM

## 2013-09-26 NOTE — Patient Instructions (Signed)

## 2013-09-26 NOTE — Progress Notes (Signed)
Patient ID: Keith Castro, male   DOB: [birth year], 53 y.o.   MRN: [id number]   Annual Screening Comprehensive Examination  This very nice 53 y.o.MWM presents for complete physical.  Patient has been followed for HTN, Prediabetes, Hyperlipidemia, and Vitamin D Deficiency.   HTN predates since 46. Patient's BP has been controlled and today's BP is 126/80 mmHg. Patient denies any cardiac symptoms as chest pain, palpitations, shortness of breath, dizziness or ankle swelling.   Patient's hyperlipidemia is controlled with diet and medications. Patient denies myalgias or other medication SE's. Last lipids were 03/31/2013: Cholesterol 185; HDL49; LDL 97; Triglycerides 196*   Patient has prediabetes with A1c 5.7% since 2011 and patient denies reactive hypoglycemic symptoms, visual blurring, diabetic polys or paresthesias. Last A1c was  5.7% on 03/31/2013.   Finally, patient has history of Vitamin D Deficiency of 33 in 2008  and last vitamin D was  100 on 03/31/2013.  Medication Sig  . ALPRAZolam (XANAX) 0.5 MG tablet Take 1 tablet (0.5 mg total) by mouth at bedtime as needed   . aspirin 325 MG tablet Take 325 mg by mouth daily.  Marland Kitchen azelastine (ASTELIN)  nasal spray Place 1 spray into both nostrils 2 (two) times daily. Use in each nostril as directed  . cetirizine (ZYRTEC ALLERGY) 10 MG tablet Take 10 mg by mouth daily.  Marland Kitchen VITAMIN D  Take 6,000 Int'l Units by mouth daily.  Marland Kitchen FLAXSEED OIL  Take by mouth daily.  . fluticasone (FLONASE) nasal spray Place 1 spray into both nostrils daily.  . meloxicam (MOBIC) 15 MG tablet Take 1 tablet (15 mg total) by mouth daily.  . Omega-3 Fatty Acids (FISH OIL PO) Take by mouth daily.  . Red Yeast Rice Extract  Take by mouth daily.  . sildenafil (VIAGRA) 100 MG tablet Take 1 tablet (100 mg total) by mouth as needed  . SUMAtriptan (IMITREX) 20 MG nasal  Place 20 mg into the nose  for migraine. May repeat in 2 hours  . verapamil  240 MG CR tablet Take 1 tablet (240 mg  total) by mouth daily.   Allergies  Allergen Reactions  . Topamax [Topiramate]    Past Medical History  Diagnosis Date  . Hyperlipidemia   . Hypertension   . Prediabetes   . Vitamin D deficiency   . Cluster headache    Past Surgical History  Procedure Laterality Date  . Appendectomy  1972  . Incise and drain abcess  2008     right shoulder  . Pleural scarification Left 1979    chest tube thoracostomy   Family History  Problem Relation Age of Onset  . Stroke Father    History   Social History  . Marital Status: Married 26 yrs    Spouse Name: N/A    Number of Children: 0   Occupational History  . Psychologist, forensic for [employer]   Social History Main Topics  . Smoking status: Current Some Day Smoker  . Smokeless tobacco: Not on file  . Alcohol Use: Yes     Comment: occ beer  . Drug Use: No  . Sexual Activity: NActive    ROS Constitutional: Denies fever, chills, weight loss/gain, headaches, insomnia, fatigue, night sweats or change in appetite. Eyes: Denies redness, blurred vision, diplopia, discharge, itchy or watery eyes.  ENT: Denies discharge, congestion, post nasal drip, epistaxis, sore throat, earache, hearing loss, dental pain, Tinnitus, Vertigo, Sinus pain or snoring.  Cardio: Denies chest pain, palpitations, irregular heartbeat, syncope,  dyspnea, diaphoresis, orthopnea, PND, claudication or edema Respiratory: denies cough, dyspnea, DOE, pleurisy, hoarseness, laryngitis or wheezing.  Gastrointestinal: Denies dysphagia, heartburn, reflux, water brash, pain, cramps, nausea, vomiting, bloating, diarrhea, constipation, hematemesis, melena, hematochezia, jaundice or hemorrhoids Genitourinary: Denies dysuria, frequency, urgency, nocturia, hesitancy, discharge, hematuria or flank pain Musculoskeletal: Denies arthralgia, myalgia, stiffness, Jt. Swelling, pain, limp or strain/sprain. Denies Falls. Skin: Denies puritis, rash, hives, warts, acne, eczema or  change in skin lesion Neuro: No weakness, tremor, incoordination, spasms, paresthesia or pain Psychiatric: Denies confusion, memory loss or sensory loss. Denies Depression. Endocrine: Denies change in weight, skin, hair change, nocturia, and paresthesia, diabetic polys, visual blurring or hyper / hypo glycemic episodes.  Heme/Lymph: No excessive bleeding, bruising or enlarged lymph nodes.  Physical Exam  BP 126/80  Pulse 60  Temp(Src) 98.2 F (36.8 C) (Temporal)  Resp 18  Ht 5' 10.5" (1.791 m)  Wt 203 lb (92.08 kg)  BMI 28.71 kg/m2  General Appearance: Well nourished, in no apparent distress. Eyes: PERRLA, EOMs, conjunctiva no swelling or erythema, normal fundi and vessels. Sinuses: No frontal/maxillary tenderness ENT/Mouth: EACs patent / TMs  nl. Nares clear without erythema, swelling, mucoid exudates. Oral hygiene is good. No erythema, swelling, or exudate. Tongue normal, non-obstructing. Tonsils not swollen or erythematous. Hearing normal.  Neck: Supple, thyroid normal. No bruits, nodes or JVD. Respiratory: Respiratory effort normal.  BS equal and clear bilateral without rales, rhonci, wheezing or stridor. Cardio: Heart sounds are normal with regular rate and rhythm and no murmurs, rubs or gallops. Peripheral pulses are normal and equal bilaterally without edema. No aortic or femoral bruits. Chest: symmetric with normal excursions and percussion.  Abdomen: Flat, soft, with bowl sounds. Nontender, no guarding, rebound, hernias, masses, or organomegaly.  Lymphatics: Non tender without lymphadenopathy.  Genitourinary: No hernias.Testes nl. DRE - prostate nl for age - smooth & firm w/o nodules. Musculoskeletal: Full ROM all peripheral extremities, joint stability, 5/5 strength, and normal gait. Skin: Warm and dry without rashes, lesions, cyanosis, clubbing or  ecchymosis.  Neuro: Cranial nerves intact, reflexes equal bilaterally. Normal muscle tone, no cerebellar symptoms. Sensation  intact.  Pysch: Awake and oriented X 3with normal affect, insight and judgment appropriate.  Assessment and Plan  1. Annual Screening Examination 2. Hypertension  3. Hyperlipidemia 4. Pre Diabetes 5. Vitamin D Deficiency  Continue prudent diet as discussed, weight control, BP monitoring, regular exercise, and medications as discussed.  Discussed med effects and SE's. Routine screening labs and tests as requested with regular follow-up as recommended.

## 2013-09-27 LAB — HEPATIC FUNCTION PANEL
ALK PHOS: 62 U/L (ref 39–117)
ALT: 35 U/L (ref 0–53)
AST: 22 U/L (ref 0–37)
Albumin: 4.3 g/dL (ref 3.5–5.2)
BILIRUBIN DIRECT: 0.1 mg/dL (ref 0.0–0.3)
BILIRUBIN INDIRECT: 0.6 mg/dL (ref 0.2–1.2)
TOTAL PROTEIN: 6.1 g/dL (ref 6.0–8.3)
Total Bilirubin: 0.7 mg/dL (ref 0.2–1.2)

## 2013-09-27 LAB — BASIC METABOLIC PANEL WITH GFR
BUN: 12 mg/dL (ref 6–23)
CO2: 20 meq/L (ref 19–32)
Calcium: 9.5 mg/dL (ref 8.4–10.5)
Chloride: 104 mEq/L (ref 96–112)
Creat: 0.91 mg/dL (ref 0.50–1.35)
GFR, Est African American: >89 mL/min
GFR, Est Non African American: >89 mL/min
Glucose, Bld: 92 mg/dL (ref 70–99)
POTASSIUM: 3.9 meq/L (ref 3.5–5.3)
SODIUM: 137 meq/L (ref 135–145)

## 2013-09-27 LAB — HEMOGLOBIN A1C
HEMOGLOBIN A1C: 5.8 % — AB (ref <5.7)
MEAN PLASMA GLUCOSE: 120 mg/dL — AB (ref <117)

## 2013-09-27 LAB — CBC WITH DIFFERENTIAL/PLATELET
BASOS PCT: 1 % (ref 0–1)
Basophils Absolute: 0.1 10*3/uL (ref 0.0–0.1)
EOS ABS: 0.3 10*3/uL (ref 0.0–0.7)
Eosinophils Relative: 5 % (ref 0–5)
HEMATOCRIT: 41.9 % (ref 39.0–52.0)
Hemoglobin: 14.6 g/dL (ref 13.0–17.0)
Lymphocytes Relative: 37 % (ref 12–46)
Lymphs Abs: 2.2 10*3/uL (ref 0.7–4.0)
MCH: 32.7 pg (ref 26.0–34.0)
MCHC: 34.8 g/dL (ref 30.0–36.0)
MCV: 93.9 fL (ref 78.0–100.0)
MONO ABS: 0.6 10*3/uL (ref 0.1–1.0)
MONOS PCT: 11 % (ref 3–12)
NEUTROS ABS: 2.7 10*3/uL (ref 1.7–7.7)
NEUTROS PCT: 46 % (ref 43–77)
Platelets: 283 10*3/uL (ref 150–400)
RBC: 4.46 MIL/uL (ref 4.22–5.81)
RDW: 13.8 % (ref 11.5–15.5)
WBC: 5.9 10*3/uL (ref 4.0–10.5)

## 2013-09-27 LAB — HEPATITIS B CORE ANTIBODY, TOTAL: HEP B C TOTAL AB: NONREACTIVE

## 2013-09-27 LAB — VITAMIN B12: Vitamin B-12: 465 pg/mL (ref 211–911)

## 2013-09-27 LAB — HEPATITIS B SURFACE ANTIBODY,QUALITATIVE: Hep B S Ab: NEGATIVE

## 2013-09-27 LAB — URINALYSIS, MICROSCOPIC ONLY
BACTERIA UA: NONE SEEN
CRYSTALS: NONE SEEN
Casts: NONE SEEN
Squamous Epithelial / LPF: NONE SEEN

## 2013-09-27 LAB — LIPID PANEL
CHOL/HDL RATIO: 4.3 ratio
Cholesterol: 177 mg/dL (ref 0–200)
HDL: 41 mg/dL (ref >39)
LDL CALC: 70 mg/dL (ref 0–99)
Triglycerides: 330 mg/dL — ABNORMAL HIGH (ref <150)
VLDL: 66 mg/dL — AB (ref 0–40)

## 2013-09-27 LAB — MICROALBUMIN / CREATININE URINE RATIO
Creatinine, Urine: 27.6 mg/dL
MICROALB UR: 0.5 mg/dL (ref 0.00–1.89)
Microalb Creat Ratio: 18.1 mg/g (ref 0.0–30.0)

## 2013-09-27 LAB — RPR

## 2013-09-27 LAB — VITAMIN D 25 HYDROXY (VIT D DEFICIENCY, FRACTURES): Vit D, 25-Hydroxy: 101 ng/mL — ABNORMAL HIGH (ref 30–89)

## 2013-09-27 LAB — TESTOSTERONE: TESTOSTERONE: 453 ng/dL (ref 300–890)

## 2013-09-27 LAB — MAGNESIUM: Magnesium: 1.9 mg/dL (ref 1.5–2.5)

## 2013-09-27 LAB — HIV ANTIBODY (ROUTINE TESTING W REFLEX): HIV 1&2 Ab, 4th Generation: NONREACTIVE

## 2013-09-27 LAB — HEPATITIS A ANTIBODY, TOTAL: Hep A Total Ab: NONREACTIVE

## 2013-09-27 LAB — TSH: TSH: 2.903 u[IU]/mL (ref 0.350–4.500)

## 2013-09-27 LAB — INSULIN, FASTING: Insulin fasting, serum: 21 u[IU]/mL (ref 3–28)

## 2013-09-27 LAB — HEPATITIS C ANTIBODY: HCV Ab: NEGATIVE

## 2013-09-27 LAB — PSA: PSA: 0.37 ng/mL (ref <=4.00)

## 2013-09-28 LAB — TB SKIN TEST
Induration: 0 mm
TB Skin Test: NEGATIVE

## 2013-09-28 LAB — HEPATITIS B E ANTIBODY: HEPATITIS BE ANTIBODY: NONREACTIVE

## 2014-03-01 ENCOUNTER — Other Ambulatory Visit: Payer: Self-pay | Admitting: *Deleted

## 2014-03-01 MED ORDER — VERAPAMIL HCL ER 240 MG PO TBCR: 240.0000 mg | EXTENDED_RELEASE_TABLET | Freq: Every day | ORAL | Status: DC

## 2014-03-03 ENCOUNTER — Other Ambulatory Visit: Payer: Self-pay | Admitting: Internal Medicine

## 2014-03-03 DIAGNOSIS — I10 Essential (primary) hypertension: Secondary | ICD-10-CM

## 2014-03-03 MED ORDER — VERAPAMIL HCL ER 240 MG PO TBCR: 240.0000 mg | EXTENDED_RELEASE_TABLET | Freq: Every day | ORAL | Status: DC

## 2014-03-13 ENCOUNTER — Other Ambulatory Visit: Payer: Self-pay | Admitting: Emergency Medicine

## 2014-03-13 ENCOUNTER — Other Ambulatory Visit: Payer: Self-pay | Admitting: *Deleted

## 2014-03-13 MED ORDER — MELOXICAM 15 MG PO TABS: 15.0000 mg | ORAL_TABLET | Freq: Every day | ORAL | Status: DC

## 2014-04-04 ENCOUNTER — Ambulatory Visit: Payer: Self-pay | Admitting: Physician Assistant

## 2014-05-06 ENCOUNTER — Other Ambulatory Visit: Payer: Self-pay | Admitting: Internal Medicine

## 2014-07-13 ENCOUNTER — Encounter: Payer: Self-pay | Admitting: Internal Medicine

## 2014-07-13 ENCOUNTER — Ambulatory Visit (INDEPENDENT_AMBULATORY_CARE_PROVIDER_SITE_OTHER): Payer: 59 | Admitting: Internal Medicine

## 2014-07-13 VITALS — BP 136/80 | HR 62 | Temp 98.2°F | Resp 18 | Ht 70.5 in | Wt 195.0 lb

## 2014-07-13 DIAGNOSIS — E785 Hyperlipidemia, unspecified: Secondary | ICD-10-CM

## 2014-07-13 DIAGNOSIS — I10 Essential (primary) hypertension: Secondary | ICD-10-CM

## 2014-07-13 DIAGNOSIS — G4700 Insomnia, unspecified: Secondary | ICD-10-CM

## 2014-07-13 DIAGNOSIS — R7303 Prediabetes: Secondary | ICD-10-CM

## 2014-07-13 DIAGNOSIS — Z79899 Other long term (current) drug therapy: Secondary | ICD-10-CM

## 2014-07-13 DIAGNOSIS — E559 Vitamin D deficiency, unspecified: Secondary | ICD-10-CM

## 2014-07-13 DIAGNOSIS — R7309 Other abnormal glucose: Secondary | ICD-10-CM

## 2014-07-13 LAB — CBC WITH DIFFERENTIAL/PLATELET
Basophils Absolute: 0.1 10*3/uL (ref 0.0–0.1)
Basophils Relative: 1 % (ref 0–1)
Eosinophils Absolute: 0.3 10*3/uL (ref 0.0–0.7)
Eosinophils Relative: 4 % (ref 0–5)
HCT: 44.5 % (ref 39.0–52.0)
Hemoglobin: 15.3 g/dL (ref 13.0–17.0)
LYMPHS ABS: 2.3 10*3/uL (ref 0.7–4.0)
LYMPHS PCT: 31 % (ref 12–46)
MCH: 32.8 pg (ref 26.0–34.0)
MCHC: 34.4 g/dL (ref 30.0–36.0)
MCV: 95.3 fL (ref 78.0–100.0)
MONO ABS: 0.9 10*3/uL (ref 0.1–1.0)
MONOS PCT: 12 % (ref 3–12)
MPV: 8.7 fL (ref 8.6–12.4)
Neutro Abs: 3.8 10*3/uL (ref 1.7–7.7)
Neutrophils Relative %: 52 % (ref 43–77)
Platelets: 268 10*3/uL (ref 150–400)
RBC: 4.67 MIL/uL (ref 4.22–5.81)
RDW: 13.3 % (ref 11.5–15.5)
WBC: 7.3 10*3/uL (ref 4.0–10.5)

## 2014-07-13 MED ORDER — ALPRAZOLAM 0.5 MG PO TABS: 0.5000 mg | ORAL_TABLET | Freq: Every evening | ORAL | Status: DC | PRN

## 2014-07-13 MED ORDER — MONTELUKAST SODIUM 10 MG PO TABS: 10.0000 mg | ORAL_TABLET | Freq: Every day | ORAL | Status: DC

## 2014-07-13 MED ORDER — ALPRAZOLAM 0.5 MG PO TABS: 0.5000 mg | ORAL_TABLET | Freq: Three times a day (TID) | ORAL | Status: DC | PRN

## 2014-07-13 MED ORDER — SILDENAFIL CITRATE 100 MG PO TABS: 100.0000 mg | ORAL_TABLET | ORAL | Status: DC | PRN

## 2014-07-13 NOTE — Progress Notes (Signed)
   Subjective:    Patient ID: Keith Castro, male    DOB: 05/06/1960, 54 y.o.   MRN: [id number]  HPI  Patient presents to the office for evaluation of HTN, hyperlipidemia, prediabetes, and insomnia.  He reports that he has been doing well.  He reports that his blood pressure has been doing well at home.  No CP, SOB, or dizziness.  [place] his [place].  He is still taking the red yeast rice for cholesterol.  He also reports that he has been working on diet and exercise.  He has been eating a lot of veggies.  He does report that he has been having trouble sleeping he reports that he has trouble falling asleep and occasionally has trouble staying asleep if he has to get up.  He has tried taking his wifes xanax which helps.  He also tried another medication which was too strong.   Review of Systems  Constitutional: Positive for fatigue. Negative for fever and chills.  HENT: Negative for congestion, rhinorrhea, sneezing and sore throat.   Respiratory: Negative for chest tightness and shortness of breath.   Cardiovascular: Negative for chest pain, palpitations and leg swelling.  Gastrointestinal: Negative for nausea, vomiting, abdominal pain, diarrhea, constipation, blood in stool and anal bleeding.  Genitourinary: Negative for dysuria, urgency, frequency and difficulty urinating.  Neurological: Negative for dizziness, syncope and light-headedness.  Psychiatric/Behavioral: Positive for sleep disturbance. Negative for dysphoric mood. The patient is not nervous/anxious.        Objective:   Physical Exam  Constitutional: He is oriented to person, place, and time. He appears well-developed and well-nourished. No distress.  HENT:  Head: Normocephalic and atraumatic.  Mouth/Throat: Oropharynx is clear and moist. No oropharyngeal exudate.  Eyes: Conjunctivae are normal. No scleral icterus.  Neck: Normal range of motion. Neck supple. No JVD present. No thyromegaly present.  Cardiovascular:  Normal rate, regular rhythm, normal heart sounds and intact distal pulses.  Exam reveals no gallop and no friction rub.   No murmur heard. Pulmonary/Chest: Effort normal and breath sounds normal. No respiratory distress. He has no wheezes. He has no rales. He exhibits no tenderness.  Abdominal: Soft. Bowel sounds are normal. He exhibits no distension and no mass. There is no tenderness. There is no rebound and no guarding.  Musculoskeletal: Normal range of motion.  Lymphadenopathy:    He has no cervical adenopathy.  Neurological: He is alert and oriented to person, place, and time.  Skin: Skin is warm and dry. He is not diaphoretic.  Psychiatric: He has a normal mood and affect. His behavior is normal. Judgment and thought content normal.  Nursing note and vitals reviewed.         Assessment & Plan:    1. Essential hypertension -cont meds -monitor at home -dash diet  2. Prediabetes -diet and exercise - Hemoglobin A1c - Insulin, random  3. Hyperlipidemia -cont meds - Lipid panel  4. Vitamin D deficiency -cont supplement - Vitamin D 1,25 dihydroxy  5. Medication management  - CBC with Differential/Platelet - BASIC METABOLIC PANEL WITH GFR - Hepatic function panel - Magnesium  6. Insomnia -xanax 0.5 mg prn at bedtime

## 2014-07-13 NOTE — Patient Instructions (Signed)
Alprazolam tablets What is this medicine? ALPRAZOLAM (al PRAY zoe lam) is a benzodiazepine. It is used to treat anxiety and panic attacks. This medicine may be used for other purposes; ask your health care provider or pharmacist if you have questions. COMMON BRAND NAME(S): Xanax What should I tell my health care provider before I take this medicine? They need to know if you have any of these conditions: -an alcohol or drug abuse problem -bipolar disorder, depression, psychosis or other mental health conditions -glaucoma -kidney or liver disease -lung or breathing disease -myasthenia gravis -Parkinson's disease -porphyria -seizures or a history of seizures -suicidal thoughts -an unusual or allergic reaction to alprazolam, other benzodiazepines, foods, dyes, or preservatives -pregnant or trying to get pregnant -breast-feeding How should I use this medicine? Take this medicine by mouth with a glass of water. Follow the directions on the prescription label. Take your medicine at regular intervals. Do not take it more often than directed. If you have been taking this medicine regularly for some time, do not suddenly stop taking it. You must gradually reduce the dose or you may get severe side effects. Ask your doctor or health care professional for advice. Even after you stop taking this medicine it can still affect your body for several days. Talk to your pediatrician regarding the use of this medicine in children. Special care may be needed. Overdosage: If you think you have taken too much of this medicine contact a poison control center or emergency room at once. NOTE: This medicine is only for you. Do not share this medicine with others. What if I miss a dose? If you miss a dose, take it as soon as you can. If it is almost time for your next dose, take only that dose. Do not take double or extra doses. What may interact with this medicine? Do not take this medicine with any of the  following medications: -certain medicines for HIV infection or AIDS -ketoconazole -itraconazole This medicine may also interact with the following medications: -birth control pills -certain macrolide antibiotics like clarithromycin, erythromycin, troleandomycin -cimetidine -cyclosporine -ergotamine -grapefruit juice -herbal or dietary supplements like kava kava, melatonin, dehydroepiandrosterone, DHEA, St. John's Wort or valerian -imatinib, STI-571 -isoniazid -levodopa -medicines for depression, anxiety, or psychotic disturbances -prescription pain medicines -rifampin, rifapentine, or rifabutin -some medicines for blood pressure or heart problems -some medicines for seizures like carbamazepine, oxcarbazepine, phenobarbital, phenytoin, primidone This list may not describe all possible interactions. Give your health care provider a list of all the medicines, herbs, non-prescription drugs, or dietary supplements you use. Also tell them if you smoke, drink alcohol, or use illegal drugs. Some items may interact with your medicine. What should I watch for while using this medicine? Visit your doctor or health care professional for regular checks on your progress. Your body can become dependent on this medicine. Ask your doctor or health care professional if you still need to take it. You may get drowsy or dizzy. Do not drive, use machinery, or do anything that needs mental alertness until you know how this medicine affects you. To reduce the risk of dizzy and fainting spells, do not stand or sit up quickly, especially if you are an older patient. Alcohol may increase dizziness and drowsiness. Avoid alcoholic drinks. Do not treat yourself for coughs, colds or allergies without asking your doctor or health care professional for advice. Some ingredients can increase possible side effects. What side effects may I notice from receiving this medicine? Side effects that you   should report to your doctor  or health care professional as soon as possible: -allergic reactions like skin rash, itching or hives, swelling of the face, lips, or tongue -confusion, forgetfulness -depression -difficulty sleeping -difficulty speaking -feeling faint or lightheaded, falls -mood changes, excitability or aggressive behavior -muscle cramps -trouble passing urine or change in the amount of urine -unusually weak or tired Side effects that usually do not require medical attention (report to your doctor or health care professional if they continue or are bothersome): -change in sex drive or performance -changes in appetite This list may not describe all possible side effects. Call your doctor for medical advice about side effects. You may report side effects to FDA at 1-800-FDA-1088. Where should I keep my medicine? Keep out of the reach of children. This medicine can be abused. Keep your medicine in a safe place to protect it from theft. Do not share this medicine with anyone. Selling or giving away this medicine is dangerous and against the law. Store at room temperature between 20 and 25 degrees C (68 and 77 degrees F). Throw away any unused medicine after the expiration date. NOTE: This sheet is a summary. It may not cover all possible information. If you have questions about this medicine, talk to your doctor, pharmacist, or health care provider.  2015, [employer]. (2007-04-29 10:34:46)  

## 2014-07-14 LAB — MAGNESIUM: MAGNESIUM: 2.2 mg/dL (ref 1.5–2.5)

## 2014-07-14 LAB — HEMOGLOBIN A1C
Hgb A1c MFr Bld: 5.8 % — ABNORMAL HIGH (ref <5.7)
Mean Plasma Glucose: 120 mg/dL — ABNORMAL HIGH (ref <117)

## 2014-07-14 LAB — BASIC METABOLIC PANEL WITH GFR
BUN: 16 mg/dL (ref 6–23)
CALCIUM: 10.1 mg/dL (ref 8.4–10.5)
CO2: 22 mEq/L (ref 19–32)
Chloride: 103 mEq/L (ref 96–112)
Creat: 0.95 mg/dL (ref 0.50–1.35)
GFR, EST NON AFRICAN AMERICAN: 89 mL/min
Glucose, Bld: 91 mg/dL (ref 70–99)
POTASSIUM: 3.9 meq/L (ref 3.5–5.3)
Sodium: 137 mEq/L (ref 135–145)

## 2014-07-14 LAB — HEPATIC FUNCTION PANEL
ALBUMIN: 4.4 g/dL (ref 3.5–5.2)
ALK PHOS: 78 U/L (ref 39–117)
ALT: 38 U/L (ref 0–53)
AST: 27 U/L (ref 0–37)
BILIRUBIN INDIRECT: 0.6 mg/dL (ref 0.2–1.2)
BILIRUBIN TOTAL: 0.8 mg/dL (ref 0.2–1.2)
Bilirubin, Direct: 0.2 mg/dL (ref 0.0–0.3)
TOTAL PROTEIN: 7.2 g/dL (ref 6.0–8.3)

## 2014-07-14 LAB — LIPID PANEL
Cholesterol: 194 mg/dL (ref 0–200)
HDL: 46 mg/dL (ref >=40)
LDL Cholesterol: 106 mg/dL — ABNORMAL HIGH (ref 0–99)
TRIGLYCERIDES: 208 mg/dL — AB (ref <150)
Total CHOL/HDL Ratio: 4.2 Ratio
VLDL: 42 mg/dL — AB (ref 0–40)

## 2014-07-14 LAB — INSULIN, RANDOM: Insulin: 12.7 u[IU]/mL (ref 2.0–19.6)

## 2014-07-16 LAB — VITAMIN D 1,25 DIHYDROXY
VITAMIN D 1, 25 (OH) TOTAL: 73 pg/mL — AB (ref 18–72)
Vitamin D2 1, 25 (OH)2: <8 pg/mL
Vitamin D3 1, 25 (OH)2: 73 pg/mL

## 2014-07-18 ENCOUNTER — Other Ambulatory Visit: Payer: Self-pay | Admitting: Internal Medicine

## 2014-07-28 ENCOUNTER — Other Ambulatory Visit: Payer: Self-pay | Admitting: Internal Medicine

## 2014-09-27 ENCOUNTER — Encounter: Payer: Self-pay | Admitting: Internal Medicine

## 2014-09-27 ENCOUNTER — Ambulatory Visit (INDEPENDENT_AMBULATORY_CARE_PROVIDER_SITE_OTHER): Payer: 59 | Admitting: Internal Medicine

## 2014-09-27 VITALS — BP 156/90 | HR 64 | Temp 97.5°F | Resp 16 | Ht 71.0 in | Wt 190.4 lb

## 2014-09-27 DIAGNOSIS — I10 Essential (primary) hypertension: Secondary | ICD-10-CM

## 2014-09-27 DIAGNOSIS — Z6826 Body mass index (BMI) 26.0-26.9, adult: Secondary | ICD-10-CM | POA: Diagnosis not present

## 2014-09-27 DIAGNOSIS — R7309 Other abnormal glucose: Secondary | ICD-10-CM | POA: Diagnosis not present

## 2014-09-27 DIAGNOSIS — Z125 Encounter for screening for malignant neoplasm of prostate: Secondary | ICD-10-CM

## 2014-09-27 DIAGNOSIS — Z1212 Encounter for screening for malignant neoplasm of rectum: Secondary | ICD-10-CM

## 2014-09-27 DIAGNOSIS — G44029 Chronic cluster headache, not intractable: Secondary | ICD-10-CM

## 2014-09-27 DIAGNOSIS — E785 Hyperlipidemia, unspecified: Secondary | ICD-10-CM | POA: Diagnosis not present

## 2014-09-27 DIAGNOSIS — R5383 Other fatigue: Secondary | ICD-10-CM

## 2014-09-27 DIAGNOSIS — E559 Vitamin D deficiency, unspecified: Secondary | ICD-10-CM | POA: Diagnosis not present

## 2014-09-27 DIAGNOSIS — R7303 Prediabetes: Secondary | ICD-10-CM

## 2014-09-27 DIAGNOSIS — Z111 Encounter for screening for respiratory tuberculosis: Secondary | ICD-10-CM | POA: Diagnosis not present

## 2014-09-27 DIAGNOSIS — Z79899 Other long term (current) drug therapy: Secondary | ICD-10-CM

## 2014-09-27 LAB — CBC WITH DIFFERENTIAL/PLATELET
BASOS ABS: 0.1 10*3/uL (ref 0.0–0.1)
Basophils Relative: 1 % (ref 0–1)
EOS PCT: 2 % (ref 0–5)
Eosinophils Absolute: 0.1 10*3/uL (ref 0.0–0.7)
HCT: 43.2 % (ref 39.0–52.0)
HEMOGLOBIN: 15.3 g/dL (ref 13.0–17.0)
LYMPHS ABS: 2.1 10*3/uL (ref 0.7–4.0)
Lymphocytes Relative: 33 % (ref 12–46)
MCH: 33.6 pg (ref 26.0–34.0)
MCHC: 35.4 g/dL (ref 30.0–36.0)
MCV: 94.7 fL (ref 78.0–100.0)
MONO ABS: 1 10*3/uL (ref 0.1–1.0)
MONOS PCT: 16 % — AB (ref 3–12)
MPV: 8.5 fL — ABNORMAL LOW (ref 8.6–12.4)
NEUTROS ABS: 3 10*3/uL (ref 1.7–7.7)
Neutrophils Relative %: 48 % (ref 43–77)
Platelets: 250 10*3/uL (ref 150–400)
RBC: 4.56 MIL/uL (ref 4.22–5.81)
RDW: 13.6 % (ref 11.5–15.5)
WBC: 6.3 10*3/uL (ref 4.0–10.5)

## 2014-09-27 LAB — BASIC METABOLIC PANEL WITH GFR
BUN: 15 mg/dL (ref 7–25)
CALCIUM: 9.7 mg/dL (ref 8.6–10.3)
CO2: 24 mmol/L (ref 20–31)
CREATININE: 0.85 mg/dL (ref 0.70–1.33)
Chloride: 106 mmol/L (ref 98–110)
Glucose, Bld: 76 mg/dL (ref 65–99)
Potassium: 4.1 mmol/L (ref 3.5–5.3)
SODIUM: 139 mmol/L (ref 135–146)

## 2014-09-27 LAB — TSH: TSH: 3.205 u[IU]/mL (ref 0.350–4.500)

## 2014-09-27 LAB — HEPATIC FUNCTION PANEL
ALBUMIN: 4.3 g/dL (ref 3.6–5.1)
ALK PHOS: 66 U/L (ref 40–115)
ALT: 30 U/L (ref 9–46)
AST: 22 U/L (ref 10–35)
BILIRUBIN DIRECT: 0.2 mg/dL (ref <=0.2)
Indirect Bilirubin: 0.6 mg/dL (ref 0.2–1.2)
Total Bilirubin: 0.8 mg/dL (ref 0.2–1.2)
Total Protein: 6.6 g/dL (ref 6.1–8.1)

## 2014-09-27 LAB — IRON AND TIBC
%SAT: 40 % (ref 20–55)
Iron: 162 ug/dL (ref 42–165)
TIBC: 406 ug/dL (ref 215–435)
UIBC: 244 ug/dL (ref 125–400)

## 2014-09-27 LAB — LIPID PANEL
Cholesterol: 175 mg/dL (ref 125–200)
HDL: 48 mg/dL (ref >=40)
LDL CALC: 93 mg/dL (ref <130)
TRIGLYCERIDES: 168 mg/dL — AB (ref <150)
Total CHOL/HDL Ratio: 3.6 Ratio (ref <=5.0)
VLDL: 34 mg/dL — AB (ref <30)

## 2014-09-27 LAB — HEMOGLOBIN A1C
HEMOGLOBIN A1C: 5.3 % (ref <5.7)
MEAN PLASMA GLUCOSE: 105 mg/dL (ref <117)

## 2014-09-27 LAB — VITAMIN B12: Vitamin B-12: 424 pg/mL (ref 211–911)

## 2014-09-27 LAB — MAGNESIUM: Magnesium: 2.2 mg/dL (ref 1.5–2.5)

## 2014-09-27 NOTE — Patient Instructions (Signed)
Recommend Adult Low dose Aspirin or coated  Aspirin 81 mg daily   To reduce risk of Colon Cancer 20 %,   Skin Cancer 26 % ,   Melanoma 46%   and   Pancreatic cancer 60% ++++++++++++++++++ Vitamin D goal is between 70-100.   Please make sure that you are taking your Vitamin D as directed.   It is very important as a natural anti-inflammatory   helping hair, skin, and nails, as well as reducing stroke and heart attack risk.   It helps your bones and helps with mood.  It also decreases numerous cancer risks so please take it as directed.   Low Vit D is associated with a 200-300% higher risk for CANCER   and 200-300% higher risk for HEART   ATTACK  &  STROKE.   ......................................  It is also associated with higher death rate at younger ages,   autoimmune diseases like Rheumatoid arthritis, Lupus, Multiple Sclerosis.     Also many other serious conditions, like depression, Alzheimer's  Dementia, infertility, muscle aches, fatigue, fibromyalgia - just to name a few.  +++++++++++++++++++  Recommend the book "The END of DIETING" by Dr Joel Fuhrman   & the book "The END of DIABETES " by Dr Joel Fuhrman  At [employer] - get book & Audio CD's     Being diabetic has a  300% increased risk for heart attack, stroke, cancer, and alzheimer- type vascular dementia. It is very important that you work harder with diet by avoiding all foods that are white. Avoid white rice (brown & wild rice is OK), white potatoes (sweetpotatoes in moderation is OK), White bread or wheat bread or anything made out of white flour like bagels, donuts, rolls, buns, biscuits, cakes, pastries, cookies, pizza crust, and pasta (made from white flour & egg whites) - vegetarian pasta or spinach or wheat pasta is OK. Multigrain breads like Arnold's or [employer], or multigrain sandwich thins or flatbreads.  Diet, exercise and weight loss can reverse and cure diabetes in the early stages.   Diet, exercise and weight loss is very important in the control and prevention of complications of diabetes which affects every system in your body, ie. Brain - dementia/stroke, eyes - glaucoma/blindness, heart - heart attack/heart failure, kidneys - dialysis, stomach - gastric paralysis, intestines - malabsorption, nerves - severe painful neuritis, circulation - gangrene & loss of a leg(s), and finally cancer and Alzheimers.    I recommend avoid fried & greasy foods,  sweets/candy, white rice (brown or wild rice or Quinoa is OK), white potatoes (sweet potatoes are OK) - anything made from white flour - bagels, doughnuts, rolls, buns, biscuits,white and wheat breads, pizza crust and traditional pasta made of white flour & egg white(vegetarian pasta or spinach or wheat pasta is OK).  Multi-grain bread is OK - like multi-grain flat bread or sandwich thins. Avoid alcohol in excess. Exercise is also important.    Eat all the vegetables you want - avoid meat, especially red meat and dairy - especially cheese.  Cheese is the most concentrated form of trans-fats which is the worst thing to clog up our arteries. Veggie cheese is OK which can be found in the fresh produce section at [employer] or [employer] or [employer]  ++++++++++++++++++++++++++   Preventive Care for Adults  A healthy lifestyle and preventive care can promote health and wellness. Preventive health guidelines for women include the following key practices.  A routine yearly physical is a good way   to check with your health care provider about your health and preventive screening. It is a chance to share any concerns and updates on your health and to receive a thorough exam.  Visit your dentist for a routine exam and preventive care every 6 months. Brush your teeth twice a day and floss once a day. Good oral hygiene prevents tooth decay and gum disease.  The frequency of eye exams is based on your age, health, family medical history, use  of contact lenses, and other factors. Follow your health care provider's recommendations for frequency of eye exams.  Eat a healthy diet. Foods like vegetables, fruits, whole grains, low-fat dairy products, and lean protein foods contain the nutrients you need without too many calories. Decrease your intake of foods high in solid fats, added sugars, and salt. Eat the right amount of calories for you.Get information about a proper diet from your health care provider, if necessary.  Regular physical exercise is one of the most important things you can do for your health. Most adults should get at least 150 minutes of moderate-intensity exercise (any activity that increases your heart rate and causes you to sweat) each week. In addition, most adults need muscle-strengthening exercises on 2 or more days a week.  Maintain a healthy weight. The body mass index (BMI) is a screening tool to identify possible weight problems. It provides an estimate of body fat based on height and weight. Your health care provider can find your BMI and can help you achieve or maintain a healthy weight.For adults 20 years and older:  A BMI below 18.5 is considered underweight.  A BMI of 18.5 to 24.9 is normal.  A BMI of 25 to 29.9 is considered overweight.  A BMI of 30 and above is considered obese.  Maintain normal blood lipids and cholesterol levels by exercising and minimizing your intake of saturated fat. Eat a balanced diet with plenty of fruit and vegetables. Blood tests for lipids and cholesterol should begin at age 49 and be repeated every 5 years. If your lipid or cholesterol levels are high, you are over 50, or you are at high risk for heart disease, you may need your cholesterol levels checked more frequently.Ongoing high lipid and cholesterol levels should be treated with medicines if diet and exercise are not working.  If you smoke, find out from your health care provider how to quit. If you do not use  tobacco, do not start.  Lung cancer screening is recommended for adults aged 6-80 years who are at high risk for developing lung cancer because of a history of smoking. A yearly low-dose CT scan of the lungs is recommended for people who have at least a 30-pack-year history of smoking and are a current smoker or have quit within the past 15 years. A pack year of smoking is smoking an average of 1 pack of cigarettes a day for 1 year (for example: 1 pack a day for 30 years or 2 packs a day for 15 years). Yearly screening should continue until the smoker has stopped smoking for at least 15 years. Yearly screening should be stopped for people who develop a health problem that would prevent them from having lung cancer treatment.  High blood pressure causes heart disease and increases the risk of stroke. Your blood pressure should be checked at least every 1 to 2 years. Ongoing high blood pressure should be treated with medicines if weight loss and exercise do not work.  If you  are 21-72 years old, ask your health care provider if you should take aspirin to prevent strokes.  Diabetes screening involves taking a blood sample to check your fasting blood sugar level. This should be done once every 3 years, after age 16, if you are within normal weight and without risk factors for diabetes. Testing should be considered at a younger age or be carried out more frequently if you are overweight and have at least 1 risk factor for diabetes.  Breast cancer screening is essential preventive care for women. You should practice "breast self-awareness." This means understanding the normal appearance and feel of your breasts and may include breast self-examination. Any changes detected, no matter how small, should be reported to a health care provider. Women in their 4s and 30s should have a clinical breast exam (CBE) by a health care provider as part of a regular health exam every 1 to 3 years. After age 39, women should  have a CBE every year. Starting at age 31, women should consider having a mammogram (breast X-ray test) every year. Women who have a family history of breast cancer should talk to their health care provider about genetic screening. Women at a high risk of breast cancer should talk to their health care providers about having an MRI and a mammogram every year.  Breast cancer gene (BRCA)-related cancer risk assessment is recommended for women who have family members with BRCA-related cancers. BRCA-related cancers include breast, ovarian, tubal, and peritoneal cancers. Having family members with these cancers may be associated with an increased risk for harmful changes (mutations) in the breast cancer genes BRCA1 and BRCA2. Results of the assessment will determine the need for genetic counseling and BRCA1 and BRCA2 testing.  Routine pelvic exams to screen for cancer are no longer recommended for nonpregnant women who are considered low risk for cancer of the pelvic organs (ovaries, uterus, and vagina) and who do not have symptoms. Ask your health care provider if a screening pelvic exam is right for you.  If you have had past treatment for cervical cancer or a condition that could lead to cancer, you need Pap tests and screening for cancer for at least 20 years after your treatment. If Pap tests have been discontinued, your risk factors (such as having a new sexual partner) need to be reassessed to determine if screening should be resumed. Some women have medical problems that increase the chance of getting cervical cancer. In these cases, your health care provider may recommend more frequent screening and Pap tests.  Colorectal cancer can be detected and often prevented. Most routine colorectal cancer screening begins at the age of 85 years and continues through age 75 years. However, your health care provider may recommend screening at an earlier age if you have risk factors for colon cancer. On a yearly  basis, your health care provider may provide home test kits to check for hidden blood in the stool. Use of a small camera at the end of a tube, to directly examine the colon (sigmoidoscopy or colonoscopy), can detect the earliest forms of colorectal cancer. Talk to your health care provider about this at age 83, when routine screening begins. Direct exam of the colon should be repeated every 5-10 years through age 31 years, unless early forms of pre-cancerous polyps or small growths are found.  Hepatitis C blood testing is recommended for all people born from [birth year] through 1965 and any individual with known risks for hepatitis C.  Pra  Osteoporosis  is a disease in which the bones lose minerals and strength with aging. This can result in serious bone fractures or breaks. The risk of osteoporosis can be identified using a bone density scan. Women ages 67 years and over and women at risk for fractures or osteoporosis should discuss screening with their health care providers. Ask your health care provider whether you should take a calcium supplement or vitamin D to reduce the rate of osteoporosis.  Menopause can be associated with physical symptoms and risks. Hormone replacement therapy is available to decrease symptoms and risks. You should talk to your health care provider about whether hormone replacement therapy is right for you.  Use sunscreen. Apply sunscreen liberally and repeatedly throughout the day. You should seek shade when your shadow is shorter than you. Protect yourself by wearing long sleeves, pants, a wide-brimmed hat, and sunglasses year round, whenever you are outdoors.  Once a month, do a whole body skin exam, using a mirror to look at the skin on your back. Tell your health care provider of new moles, moles that have irregular borders, moles that are larger than a pencil eraser, or moles that have changed in shape or color.  Stay current with required vaccines  (immunizations).  Influenza vaccine. All adults should be immunized every year.  Tetanus, diphtheria, and acellular pertussis (Td, Tdap) vaccine. Pregnant women should receive 1 dose of Tdap vaccine during each pregnancy. The dose should be obtained regardless of the length of time since the last dose. Immunization is preferred during the 27th-36th week of gestation. An adult who has not previously received Tdap or who does not know her vaccine status should receive 1 dose of Tdap. This initial dose should be followed by tetanus and diphtheria toxoids (Td) booster doses every 10 years. Adults with an unknown or incomplete history of completing a 3-dose immunization series with Td-containing vaccines should begin or complete a primary immunization series including a Tdap dose. Adults should receive a Td booster every 10 years.  Varicella vaccine. An adult without evidence of immunity to varicella should receive 2 doses or a second dose if she has previously received 1 dose. Pregnant females who do not have evidence of immunity should receive the first dose after pregnancy. This first dose should be obtained before leaving the health care facility. The second dose should be obtained 4-8 weeks after the first dose.  Human papillomavirus (HPV) vaccine. Females aged 13-26 years who have not received the vaccine previously should obtain the 3-dose series. The vaccine is not recommended for use in pregnant females. However, pregnancy testing is not needed before receiving a dose. If a male is found to be pregnant after receiving a dose, no treatment is needed. In that case, the remaining doses should be delayed until after the pregnancy. Immunization is recommended for any person with an immunocompromised condition through the age of 11 years if she did not get any or all doses earlier. During the 3-dose series, the second dose should be obtained 4-8 weeks after the first dose. The third dose should be obtained  24 weeks after the first dose and 16 weeks after the second dose.  Zoster vaccine. One dose is recommended for adults aged 58 years or older unless certain conditions are present.  Measles, mumps, and rubella (MMR) vaccine. Adults born before [birth year] generally are considered immune to measles and mumps. Adults born in [birth year] or later should have 1 or more doses of MMR vaccine unless there is a contraindication  to the vaccine or there is laboratory evidence of immunity to each of the three diseases. A routine second dose of MMR vaccine should be obtained at least 28 days after the first dose for students attending postsecondary schools, health care workers, or international travelers. People who received inactivated measles vaccine or an unknown type of measles vaccine during 1963-1967 should receive 2 doses of MMR vaccine. People who received inactivated mumps vaccine or an unknown type of mumps vaccine before 1979 and are at high risk for mumps infection should consider immunization with 2 doses of MMR vaccine. For females of childbearing age, rubella immunity should be determined. If there is no evidence of immunity, females who are not pregnant should be vaccinated. If there is no evidence of immunity, females who are pregnant should delay immunization until after pregnancy. Unvaccinated health care workers born before [birth year] who lack laboratory evidence of measles, mumps, or rubella immunity or laboratory confirmation of disease should consider measles and mumps immunization with 2 doses of MMR vaccine or rubella immunization with 1 dose of MMR vaccine.  Pneumococcal 13-valent conjugate (PCV13) vaccine. When indicated, a person who is uncertain of her immunization history and has no record of immunization should receive the PCV13 vaccine. An adult aged 26 years or older who has certain medical conditions and has not been previously immunized should receive 1 dose of PCV13 vaccine. This PCV13 should be followed  with a dose of pneumococcal polysaccharide (PPSV23) vaccine. The PPSV23 vaccine dose should be obtained at least 8 weeks after the dose of PCV13 vaccine. An adult aged 78 years or older who has certain medical conditions and previously received 1 or more doses of PPSV23 vaccine should receive 1 dose of PCV13. The PCV13 vaccine dose should be obtained 1 or more years after the last PPSV23 vaccine dose.    Pneumococcal polysaccharide (PPSV23) vaccine. When PCV13 is also indicated, PCV13 should be obtained first. All adults aged 9 years and older should be immunized. An adult younger than age 13 years who has certain medical conditions should be immunized. Any person who resides in a nursing home or long-term care facility should be immunized. An adult smoker should be immunized. People with an immunocompromised condition and certain other conditions should receive both PCV13 and PPSV23 vaccines. People with human immunodeficiency virus (HIV) infection should be immunized as soon as possible after diagnosis. Immunization during chemotherapy or radiation therapy should be avoided. Routine use of PPSV23 vaccine is not recommended for American Indians, [place] Natives, or people younger than 65 years unless there are medical conditions that require PPSV23 vaccine. When indicated, people who have unknown immunization and have no record of immunization should receive PPSV23 vaccine. One-time revaccination 5 years after the first dose of PPSV23 is recommended for people aged 19-64 years who have chronic kidney failure, nephrotic syndrome, asplenia, or immunocompromised conditions. People who received 1-2 doses of PPSV23 before age 64 years should receive another dose of PPSV23 vaccine at age 53 years or later if at least 5 years have passed since the previous dose. Doses of PPSV23 are not needed for people immunized with PPSV23 at or after age 23 years.  Preventive Services / Frequency   Ages 73 to 11 years  Blood  pressure check.  Lipid and cholesterol check.  Lung cancer screening. / Every year if you are aged 42-80 years and have a 30-pack-year history of smoking and currently smoke or have quit within the past 15 years. Yearly screening is stopped once  years. Yearly screening is stopped once you have quit smoking for at least 15 years or develop a health problem that would prevent you from having lung cancer treatment.  Clinical breast exam.** / Every year after age 40 years.  BRCA-related cancer risk assessment.** / For women who have family members with a BRCA-related cancer (breast, ovarian, tubal, or peritoneal cancers).  Mammogram.** / Every year beginning at age 40 years and continuing for as long as you are in good health. Consult with your health care provider.  Pap test.** / Every 3 years starting at age 30 years through age 65 or 70 years with a history of 3 consecutive normal Pap tests.  HPV screening.** / Every 3 years from ages 30 years through ages 65 to 70 years with a history of 3 consecutive normal Pap tests.  Fecal occult blood test (FOBT) of stool. / Every year beginning at age 50 years and continuing until age 75 years. You may not need to do this test if you get a colonoscopy every 10 years.  Flexible sigmoidoscopy or colonoscopy.** / Every 5 years for a flexible sigmoidoscopy or every 10 years for a colonoscopy beginning at age 50 years and continuing until age 75 years.  Hepatitis C blood test.** / For all people born from 1945 through 1965 and any individual with known risks for hepatitis C.  Skin self-exam. / Monthly.  Influenza vaccine. / Every year.  Tetanus, diphtheria, and acellular pertussis (Tdap/Td) vaccine.** / Consult your health care provider. Pregnant women should receive 1 dose of Tdap vaccine during each pregnancy. 1 dose of Td every 10 years.  Varicella vaccine.** / Consult your health care provider. Pregnant females who do not have evidence of immunity should receive the first dose after  pregnancy.  Zoster vaccine.** / 1 dose for adults aged 60 years or older.  Pneumococcal 13-valent conjugate (PCV13) vaccine.** / Consult your health care provider.  Pneumococcal polysaccharide (PPSV23) vaccine.** / 1 to 2 doses if you smoke cigarettes or if you have certain conditions.  Meningococcal vaccine.** / Consult your health care provider.  Hepatitis A vaccine.** / Consult your health care provider.  Hepatitis B vaccine.** / Consult your health care provider. Screening for abdominal aortic aneurysm (AAA)  by ultrasound is recommended for people over 50 who have history of high blood pressure or who are current or former smokers. 

## 2014-09-27 NOTE — Progress Notes (Signed)
Patient ID: Keith Castro, male   DOB: Sep 27, 1960, 54 y.o.   MRN: [id number]   Comprehensive Examination  This very nice 54 y.o. MWM presents for complete physical.  Patient has been followed for HTN, Prediabetes, Hyperlipidemia, and Vitamin D Deficiency.   HTN predates since 2004. Patient's BP has been controlled, but today's BP was elevated at 156/90. Patient denies any cardiac symptoms as chest pain, palpitations, shortness of breath, dizziness or ankle swelling.   Patient's hyperlipidemia is near controlled with diet and supplements. Patient denies myalgias or other medication SE's. Last lipids were  Cholesterol 194; HDL 46; LDL Cholesterol 106*; Triglycerides 208 on 07/13/2014.   Patient has prediabetes since 2011 with A1c 5.7% and patient denies reactive hypoglycemic symptoms, visual blurring, diabetic polys or paresthesias. Last A1c was 5.8% on 07/13/2014.   Finally, patient has history of Vitamin D Deficiency of 33 in 2008 and last vitamin D was 101 in Aug 2015.      Medication Sig  . acyclovir  200 MG capsule   . ALPRAZolam  0.5 MG tablet Take 1 tablet (0.5 mg total) by mouth at bedtime as needed for anxiety.  Marland Kitchen aspirin 325 MG tablet Take 325 mg by mouth daily.  . ASTELIN nasal spray Place 1 spray into both nostrils 2 (two) times daily. Use in each nostril as directed  . cetirizine  10 MG tablet Take 10 mg by mouth daily.  Marland Kitchen VITAMIN D  Take 6,000 Int'l Units by mouth daily.  Marland Kitchen FLAXSEED OIL  Take by mouth daily.  Marland Kitchen FLONASE nasal spray Place 1 spray into both nostrils daily.  . meloxicam  15 MG tablet Take 1 tablet (15 mg total) by mouth daily.  . montelukast  10 MG tablet Take 1 tablet (10 mg total) by mouth daily.  Marland Kitchen FISH OIL Take by mouth daily.  . Red Yeast Rice Extract  Take by mouth daily.  . sildenafil  100 MG tablet Take 1 tablet (100 mg total) by mouth as needed for erectile dysfunction.  . SUMAtriptan  nasal spray Place 20 mg into the nose every 2 (two) hours as needed for  migraine or headache. May repeat in 2 hours   . verapamil -SR 240 MG CR tablet Take 1 tablet by mouth  daily   Allergies  Allergen Reactions  . Topamax [Topiramate]    Past Medical History  Diagnosis Date  . Hyperlipidemia   . Hypertension   . Prediabetes   . Vitamin D deficiency   . Cluster headache    Health Maintenance  Topic Date Due  . COLONOSCOPY  04/24/2008  . INFLUENZA VACCINE  09/18/2014  . TETANUS/TDAP  04/09/2018  . Hepatitis C Screening  Completed  . HIV Screening  Completed   Immunization History  Administered Date(s) Administered  . PPD Test 09/26/2013, 09/27/2014  . Pneumococcal-Unspecified 07/24/2008  . Td 04/14/2006, 04/09/2008   Past Surgical History  Procedure Laterality Date  . Appendectomy  1972  . Incise and drain abcess  2008     right shoulder  . Pleural scarification Left 1979    chest tube thoracostomy   Family History  Problem Relation Age of Onset  . Stroke Father    Social History   Social History  . Marital Status: Married    Spouse Name: N/A  . Number of Children: N/A  . Years of Education: N/A   Occupational History  . Not on file.   Social History Main Topics  . Smoking status: Current Every  Day Smoker  . Smokeless tobacco: Not on file     Comment: smokes 5-6 cigarettes daily  . Alcohol Use: 0.0 oz/week    0 Standard drinks or equivalent per week     Comment: occ beer  . Drug Use: No  . Sexual Activity: Active    ROS Constitutional: Denies fever, chills, weight loss/gain, headaches, insomnia,  night sweats or change in appetite. Does c/o fatigue. Eyes: Denies redness, blurred vision, diplopia, discharge, itchy or watery eyes.  ENT: Denies discharge, congestion, post nasal drip, epistaxis, sore throat, earache, hearing loss, dental pain, Tinnitus, Vertigo, Sinus pain or snoring.  Cardio: Denies chest pain, palpitations, irregular heartbeat, syncope, dyspnea, diaphoresis, orthopnea, PND, claudication or  edema Respiratory: denies cough, dyspnea, DOE, pleurisy, hoarseness, laryngitis or wheezing.  Gastrointestinal: Denies dysphagia, heartburn, reflux, water brash, pain, cramps, nausea, vomiting, bloating, diarrhea, constipation, hematemesis, melena, hematochezia, jaundice or hemorrhoids Genitourinary: Denies dysuria, frequency, urgency, nocturia, hesitancy, discharge, hematuria or flank pain Musculoskeletal: Denies arthralgia, myalgia, stiffness, Jt. Swelling, pain, limp or strain/sprain. Denies Falls. Skin: Denies puritis, rash, hives, warts, acne, eczema or change in skin lesion Neuro: No weakness, tremor, incoordination, spasms, paresthesia or pain Psychiatric: Denies confusion, memory loss or sensory loss. Denies Depression. Endocrine: Denies change in weight, skin, hair change, nocturia, and paresthesia, diabetic polys, visual blurring or hyper / hypo glycemic episodes.  Heme/Lymph: No excessive bleeding, bruising or enlarged lymph nodes.  Physical Exam  BP  mmHg  Pulse 64  Temp 97.5 F   Resp 16  Ht 5\' 11"   Wt 190 lb 6.4 oz (86.365 kg)    BMI 26.57    General Appearance: Well nourished, in no apparent distress. Eyes: PERRLA, EOMs, conjunctiva no swelling or erythema, normal fundi and vessels. Sinuses: No frontal/maxillary tenderness ENT/Mouth: EACs patent / TMs  nl. Nares clear without erythema, swelling, mucoid exudates. Oral hygiene is good. No erythema, swelling, or exudate. Tongue normal, non-obstructing. Tonsils not swollen or erythematous. Hearing normal.  Neck: Supple, thyroid normal. No bruits, nodes or JVD. Respiratory: Respiratory effort normal.  BS equal and clear bilateral without rales, rhonci, wheezing or stridor. Cardio: Heart sounds are normal with regular rate and rhythm and no murmurs, rubs or gallops. Peripheral pulses are normal and equal bilaterally without edema. No aortic or femoral bruits. Chest: symmetric with normal excursions and percussion.  Abdomen:  Flat, soft, with bowel sounds. Nontender, no guarding, rebound, hernias, masses, or organomegaly.  Lymphatics: Non tender without lymphadenopathy.  Genitourinary: No hernias.Testes nl. DRE - prostate nl for age - smooth & firm w/o nodules. Musculoskeletal: Full ROM all peripheral extremities, joint stability, 5/5 strength, and normal gait. Skin: Warm and dry without rashes, lesions, cyanosis, clubbing or  ecchymosis.  Neuro: Cranial nerves intact, reflexes equal bilaterally. Normal muscle tone, no cerebellar symptoms. Sensation intact.  Pysch: Awake and oriented X 3 with normal affect, insight and judgment appropriate.   Assessment and Plan  1. Essential hypertension  - Microalbumin / creatinine urine ratio - EKG 12-Lead - [place], RETROPERITNL ABD,  LTD - TSH  2. Hyperlipidemia  - Lipid panel  3. Prediabetes  - Hemoglobin A1c - Insulin, random  4. Vitamin D deficiency  - Vit D  25 hydroxy   5. Chronic cluster headache, hx   6. Screening for rectal cancer  - POC Hemoccult Bld/Stl   7. Prostate cancer screening  - PSA  8. Screening examination for pulmonary tuberculosis  - PPD  9. Other fatigue  - Vitamin B12 - Testosterone - Iron  and TIBC - TSH  10. Medication management  - Urine Microscopic - CBC with Differential/Platelet - BASIC METABOLIC PANEL WITH GFR - Hepatic function panel - Magnesium  11. BMI 26.0-26.9,adult   Continue prudent diet as discussed, weight control, BP monitoring, regular exercise, and medications as discussed.  Discussed med effects and SE's. Routine screening labs and tests as requested with regular follow-up as recommended.  Over 40 minutes of exam, counseling &  chart review was performed

## 2014-09-28 LAB — URINALYSIS, MICROSCOPIC ONLY
Bacteria, UA: NONE SEEN [HPF]
CRYSTALS: NONE SEEN [HPF]
Casts: NONE SEEN [LPF]
RBC / HPF: NONE SEEN RBC/HPF (ref <=2)
Squamous Epithelial / LPF: NONE SEEN [HPF] (ref <=5)
WBC UA: NONE SEEN WBC/HPF (ref <=5)
Yeast: NONE SEEN [HPF]

## 2014-09-28 LAB — MICROALBUMIN / CREATININE URINE RATIO
CREATININE, URINE: 106.4 mg/dL
MICROALB/CREAT RATIO: 2.8 mg/g (ref 0.0–30.0)
Microalb, Ur: 0.3 mg/dL (ref <2.0)

## 2014-09-28 LAB — PSA: PSA: 0.35 ng/mL (ref <=4.00)

## 2014-09-28 LAB — TESTOSTERONE: Testosterone: 430 ng/dL (ref 300–890)

## 2014-09-28 LAB — VITAMIN D 25 HYDROXY (VIT D DEFICIENCY, FRACTURES): Vit D, 25-Hydroxy: 69 ng/mL (ref 30–100)

## 2014-09-28 LAB — INSULIN, RANDOM: INSULIN: 9.2 u[IU]/mL (ref 2.0–19.6)

## 2014-10-28 ENCOUNTER — Other Ambulatory Visit: Payer: Self-pay | Admitting: Internal Medicine

## 2014-10-28 DIAGNOSIS — I10 Essential (primary) hypertension: Secondary | ICD-10-CM

## 2014-11-06 ENCOUNTER — Other Ambulatory Visit: Payer: Self-pay | Admitting: *Deleted

## 2014-11-06 MED ORDER — MONTELUKAST SODIUM 10 MG PO TABS: 10.0000 mg | ORAL_TABLET | Freq: Every day | ORAL | Status: DC

## 2014-11-07 ENCOUNTER — Other Ambulatory Visit: Payer: Self-pay | Admitting: *Deleted

## 2014-11-07 MED ORDER — ALPRAZOLAM 0.5 MG PO TABS
0.5000 mg | ORAL_TABLET | Freq: Every evening | ORAL | Status: DC | PRN
Start: 2014-11-07 — End: 2015-10-23

## 2015-02-20 ENCOUNTER — Other Ambulatory Visit: Payer: Self-pay | Admitting: *Deleted

## 2015-02-20 MED ORDER — MELOXICAM 15 MG PO TABS: 15.0000 mg | ORAL_TABLET | Freq: Every day | ORAL | Status: DC

## 2015-02-22 ENCOUNTER — Other Ambulatory Visit: Payer: Self-pay | Admitting: *Deleted

## 2015-02-22 MED ORDER — MELOXICAM 15 MG PO TABS: 15.0000 mg | ORAL_TABLET | Freq: Every day | ORAL | Status: DC

## 2015-04-15 ENCOUNTER — Other Ambulatory Visit: Payer: Self-pay | Admitting: Internal Medicine

## 2015-04-30 ENCOUNTER — Other Ambulatory Visit: Payer: Self-pay | Admitting: Internal Medicine

## 2015-05-21 ENCOUNTER — Encounter: Payer: Self-pay | Admitting: Physician Assistant

## 2015-05-21 ENCOUNTER — Ambulatory Visit (INDEPENDENT_AMBULATORY_CARE_PROVIDER_SITE_OTHER): Payer: 59 | Admitting: Physician Assistant

## 2015-05-21 VITALS — BP 160/100 | HR 68 | Temp 97.7°F | Resp 16 | Ht 71.0 in | Wt 193.2 lb

## 2015-05-21 DIAGNOSIS — E785 Hyperlipidemia, unspecified: Secondary | ICD-10-CM

## 2015-05-21 DIAGNOSIS — I10 Essential (primary) hypertension: Secondary | ICD-10-CM

## 2015-05-21 DIAGNOSIS — Z79899 Other long term (current) drug therapy: Secondary | ICD-10-CM

## 2015-05-21 DIAGNOSIS — E559 Vitamin D deficiency, unspecified: Secondary | ICD-10-CM | POA: Diagnosis not present

## 2015-05-21 DIAGNOSIS — G44029 Chronic cluster headache, not intractable: Secondary | ICD-10-CM

## 2015-05-21 MED ORDER — PREDNISONE 20 MG PO TABS: ORAL_TABLET | ORAL | Status: DC

## 2015-05-21 MED ORDER — SUMATRIPTAN 20 MG/ACT NA SOLN: 20.0000 mg | NASAL | Status: DC | PRN

## 2015-05-21 MED ORDER — VERAPAMIL HCL ER 360 MG PO CP24: 360.0000 mg | ORAL_CAPSULE | Freq: Every day | ORAL | Status: DC

## 2015-05-21 NOTE — Patient Instructions (Addendum)
Increase verapamil to  at night Take melatonin, can get over the counter, get  to   Start on the prednisone Please take the prednisone to help decrease inflammation and therefore decrease symptoms. Take it it with food to avoid GI upset. It can cause increased energy but on the other hand it can make it hard to sleep at night so please take it AT NIGHT WITH DINNER, it takes 8-12 hours to start working so it will NOT affect your sleeping if you take it at night with your food!!  If you are diabetic it will increase your sugars so decrease carbs and monitor your sugars closely.    Cluster Headache Cluster headaches are recognized by their pattern of deep, intense head pain. They normally occur on one side of your head, but they may "switch sides" in subsequent episodes. Typically, cluster headaches:   Are severe in nature.   Occur repeatedly over weeks to months and are followed by periods of no headaches.   Can last from 15 minutes to 3 hours.   Occur at the same time each day, often at night.   Occur several times a day. CAUSES The exact cause of cluster headaches is not known. Alcohol use may be associated with cluster headaches. SIGNS AND SYMPTOMS   Severe pain that begins in or around your eye or temple.   One-sided head pain.   Feeling sick to your stomach (nauseous).   Sensitivity to light.   Runny nose.   Eye redness, tearing, and nasal stuffiness on the side of your head where you are experiencing pain.   Sweaty, pale skin of the face.   Droopy or swollen eyelid.   Restlessness. DIAGNOSIS  Cluster headaches are diagnosed based on symptoms and a physical exam. Your health care provider may order a CT scan or an MRI of your head or lab tests to see if your headaches are caused by other medical conditions.  TREATMENT   Medicines for pain relief and to prevent recurrent attacks. Some people may need a combination of medicines.  Oxygen for pain  relief.   Biofeedback programs to help reduce headache pain.  It may be helpful to keep a headache diary. This may help you find a trend for what is triggering your headaches. Your health care provider can develop a treatment plan.  HOME CARE INSTRUCTIONS  During cluster periods:   Follow a regular sleep schedule. Do not vary the amount and time that you sleep from day to day. It is important to stay on the same schedule during a cluster period to help prevent headaches.   Avoid alcohol.   Stop smoking if you smoke.  SEEK MEDICAL CARE IF:  You have any changes from your previous cluster headaches either in intensity or frequency.   You are not getting relief from medicines you are taking.  SEEK IMMEDIATE MEDICAL CARE IF:   You faint.   You have weakness or numbness, especially on one side of your body or face.   You have double vision.   You have nausea or vomiting that is not relieved within several hours.   You cannot keep your balance or have difficulty talking or walking.   You have neck pain or stiffness.   You have a fever. MAKE SURE YOU:  Understand these instructions.   Will watch your condition.   Will get help right away if you are not doing well or get worse.   This information is not intended to  replace advice given to you by your health care provider. Make sure you discuss any questions you have with your health care provider.   Document Released: 02/03/2005 Document Revised: 11/24/2012 Document Reviewed: 08/26/2012 Elsevier Interactive Patient Education [employer]2016 [employer]

## 2015-05-21 NOTE — Progress Notes (Signed)
Assessment and Plan:  1. Hypertension -Continue medication, monitor blood pressure at home. Continue DASH diet.  Reminder to go to the ER if any CP, SOB, nausea, dizziness, severe HA, changes vision/speech, left arm numbness and tingling and jaw pain.  2. Cholesterol -Continue diet and exercise. Check cholesterol.   3. Prediabetes  -Continue diet and exercise. Check A1C  4. Vitamin D Def - check level and continue medications.   5. Cluster headache Increase verapamil 360, add prednisone, refill sumatriptan, add melatonin  Continue diet and meds as discussed. Further disposition pending results of labs. Over 30 minutes of exam, counseling, chart review, and critical decision making was performed  HPI 55 y.o. male  presents for 3 month follow up on hypertension, cholesterol, prediabetes, and vitamin D deficiency.   His blood pressure has been controlled at home, today their BP is BP: (!) 160/100 mmHg, states has been normal at home but very concerned about headache coming.  He has a history of cluster headaches, had a small one last week, then states Saturday night and last night, he has pain behind left eye, states that last time he had 43 days straight of it.   He does not workout. He denies chest pain, shortness of breath, dizziness.  He is not on cholesterol medication and denies myalgias. His cholesterol is not at goal. The cholesterol last visit was:   Lab Results  Component Value Date   CHOL 175 09/27/2014   HDL 48 09/27/2014   LDLCALC 93 09/27/2014   TRIG 168* 09/27/2014   CHOLHDL 3.6 09/27/2014   Last Z6XA1C in the office was:  Lab Results  Component Value Date   HGBA1C 5.3 09/27/2014   Patient is on Vitamin D supplement.   Lab Results  Component Value Date   VD25OH 69 09/27/2014      Current Medications:  Current Outpatient Prescriptions on File Prior to Visit  Medication Sig Dispense Refill  . acyclovir (ZOVIRAX) 200 MG capsule     . ALPRAZolam (XANAX) 0.5 MG  tablet Take 1 tablet (0.5 mg total) by mouth at bedtime as needed for anxiety. 90 tablet 1  . aspirin 325 MG tablet Take 325 mg by mouth daily.    . cetirizine (ZYRTEC ALLERGY) 10 MG tablet Take 10 mg by mouth daily.    . Cholecalciferol (VITAMIN D PO) Take 5,000 Int'l Units by mouth daily.     . Flaxseed, Linseed, (FLAXSEED OIL PO) Take by mouth daily.    . meloxicam (MOBIC) 15 MG tablet Take 1 tablet by mouth  daily 90 tablet 1  . montelukast (SINGULAIR) 10 MG tablet Take 1 tablet (10 mg total) by mouth daily. 90 tablet 2  . Omega-3 Fatty Acids (FISH OIL PO) Take by mouth daily.    . Red Yeast Rice Extract (RED YEAST RICE PO) Take by mouth daily.    . sildenafil (VIAGRA) 100 MG tablet Take 1 tablet (100 mg total) by mouth as needed for erectile dysfunction. 20 tablet 1  . verapamil (CALAN-SR) 240 MG CR tablet Take 1 tablet by mouth  daily 90 tablet 1   No current facility-administered medications on file prior to visit.   Medical History:  Past Medical History  Diagnosis Date  . Hyperlipidemia   . Hypertension   . Prediabetes   . Vitamin D deficiency   . Cluster headache    Allergies:  Allergies  Allergen Reactions  . Topamax [Topiramate]      Review of Systems:  Review of  Systems  Constitutional: Negative for fever and chills.  HENT: Negative for congestion and sore throat.   Respiratory: Negative for shortness of breath.   Cardiovascular: Negative for chest pain, palpitations and leg swelling.  Gastrointestinal: Negative for nausea, vomiting, abdominal pain, diarrhea, constipation and blood in stool.  Genitourinary: Negative for dysuria, urgency and frequency.  Neurological: Positive for headaches. Negative for dizziness.  Psychiatric/Behavioral: The patient is not nervous/anxious.     Family history- Review and unchanged Social history- Review and unchanged Physical Exam: BP 160/100 mmHg  Pulse 68  Temp(Src) 97.7 F (36.5 C) (Temporal)  Resp 16  Ht  (1.803  m)  Wt 193 lb 3.2 oz (87.635 kg)  BMI 26.96 kg/m2  SpO2 97% Wt Readings from Last 3 Encounters:  05/21/15 193 lb 3.2 oz (87.635 kg)  09/27/14 190 lb 6.4 oz (86.365 kg)  07/13/14 195 lb (88.451 kg)   General Appearance: Well nourished, in no apparent distress. Eyes: PERRLA, EOMs, conjunctiva no swelling or erythema Sinuses: No Frontal/maxillary tenderness ENT/Mouth: Ext aud canals clear, TMs without erythema, bulging. No erythema, swelling, or exudate on post pharynx.  Tonsils not swollen or erythematous. Hearing normal.  Neck: Supple, thyroid normal.  Respiratory: Respiratory effort normal, BS equal bilaterally without rales, rhonchi, wheezing or stridor.  Cardio: RRR with no MRGs. Brisk peripheral pulses without edema.  Abdomen: Soft, + BS,  Non tender, no guarding, rebound, hernias, masses. Lymphatics: Non tender without lymphadenopathy.  Musculoskeletal: Full ROM, 5/5 strength, Normal gait Skin: Warm, dry without rashes, lesions, ecchymosis.  Neuro: Cranial nerves intact. Normal muscle tone, no cerebellar symptoms. Psych: Awake and oriented X 3, normal affect, Insight and Judgment appropriate.    Quentin Mulling, PA-C 4:32 PM [facility]

## 2015-05-28 ENCOUNTER — Encounter: Payer: Self-pay | Admitting: Physician Assistant

## 2015-05-28 ENCOUNTER — Ambulatory Visit (INDEPENDENT_AMBULATORY_CARE_PROVIDER_SITE_OTHER): Payer: 59 | Admitting: Physician Assistant

## 2015-05-28 VITALS — BP 140/82 | HR 82 | Temp 97.9°F | Resp 16 | Ht 71.0 in | Wt 188.2 lb

## 2015-05-28 DIAGNOSIS — I10 Essential (primary) hypertension: Secondary | ICD-10-CM

## 2015-05-28 DIAGNOSIS — G44029 Chronic cluster headache, not intractable: Secondary | ICD-10-CM | POA: Diagnosis not present

## 2015-05-28 MED ORDER — PREDNISONE 20 MG PO TABS: ORAL_TABLET | ORAL | Status: AC

## 2015-05-28 NOTE — Progress Notes (Signed)
Assessment and Plan: Cluster Headache- continue the verapamil 360, melatonin, and finish prednisone taper, will resend prednisone for future episodes if needed.   Future Appointments Date Time Provider Department Center  10/18/2015 2:00 PM Lucky CowboyWilliam McKeown, MD [facility] None     HPI 55 y.o.male presents for follow up for BP and cluster headaches. He has history of cluster headaches, last one was several years ago, this last weekend he started to have pain behind left eye like last time, his verapamil was increased to 360, his sumatriptan was refilled and he added prednisone and melatonin 10mg .   Blood pressure 140/82, pulse 82, temperature 97.9 F (36.6 C), temperature source Temporal, resp. rate 16, height 5\' 11"  (1.803 m), weight 188 lb 3.2 oz (85.367 kg), SpO2 97 %.   Past Medical History  Diagnosis Date  . Hyperlipidemia   . Hypertension   . Prediabetes   . Vitamin D deficiency   . Cluster headache      Allergies  Allergen Reactions  . Topamax [Topiramate]       Current Outpatient Prescriptions on File Prior to Visit  Medication Sig Dispense Refill  . acyclovir (ZOVIRAX) 200 MG capsule     . ALPRAZolam (XANAX) 0.5 MG tablet Take 1 tablet (0.5 mg total) by mouth at bedtime as needed for anxiety. 90 tablet 1  . aspirin 325 MG tablet Take 325 mg by mouth daily.    . cetirizine (ZYRTEC ALLERGY) 10 MG tablet Take 10 mg by mouth daily.    . Cholecalciferol (VITAMIN D PO) Take 5,000 Int'l Units by mouth daily.     . Flaxseed, Linseed, (FLAXSEED OIL PO) Take by mouth daily.    . meloxicam (MOBIC) 15 MG tablet Take 1 tablet by mouth  daily 90 tablet 1  . montelukast (SINGULAIR) 10 MG tablet Take 1 tablet (10 mg total) by mouth daily. 90 tablet 2  . Omega-3 Fatty Acids (FISH OIL PO) Take by mouth daily.    . predniSONE (DELTASONE) 20 MG tablet 1 pill 2 x a day for 5 days, 1 pill x a day x 5 days, 1/2 pill a day x 6 days with food 30 tablet 0  . Red Yeast Rice Extract (RED YEAST  RICE PO) Take by mouth daily.    . sildenafil (VIAGRA) 100 MG tablet Take 1 tablet (100 mg total) by mouth as needed for erectile dysfunction. 20 tablet 1  . SUMAtriptan (IMITREX) 20 MG/ACT nasal spray Place 1 spray (20 mg total) into the nose every 2 (two) hours as needed (cluster headache needs 1 month). 30 Inhaler 3  . verapamil (VERELAN PM) 360 MG 24 hr capsule Take 1 capsule (360 mg total) by mouth at bedtime. 90 capsule 1   No current facility-administered medications on file prior to visit.    ROS: all negative except above.   Physical Exam: There were no vitals filed for this visit. Ht 5\' 11"  (1.803 m) General Appearance: Well nourished, in no apparent distress. Eyes: PERRLA, EOMs, conjunctiva no swelling or erythema Sinuses: No Frontal/maxillary tenderness ENT/Mouth: Ext aud canals clear, TMs without erythema, bulging. No erythema, swelling, or exudate on post pharynx.  Tonsils not swollen or erythematous. Hearing normal.  Neck: Supple, thyroid normal.  Respiratory: Respiratory effort normal, BS equal bilaterally without rales, rhonchi, wheezing or stridor.  Cardio: RRR with no MRGs. Brisk peripheral pulses without edema.  Abdomen: Soft, + BS.  Non tender, no guarding, rebound, hernias, masses. Lymphatics: Non tender without lymphadenopathy.  Musculoskeletal: Full ROM,  5/5 strength, normal gait.  Skin: Warm, dry without rashes, lesions, ecchymosis.  Neuro: Cranial nerves intact. Normal muscle tone, no cerebellar symptoms. Sensation intact.  Psych: Awake and oriented X 3, normal affect, Insight and Judgment appropriate.     Quentin Mulling, PA-C 3:56 PM [facility]

## 2015-05-28 NOTE — Patient Instructions (Signed)
Monitor your blood pressure at home. Go to the ER if any CP, SOB, nausea, dizziness, severe HA, changes vision/speech  Goal BP:  For patients younger than 60: Goal BP < 140/90. For patients 60 and older: Goal BP < 150/90. For patients with diabetes: Goal BP < 140/90. Your most recent BP: BP: 140/82 mmHg   Take your medications faithfully as instructed. Maintain a healthy weight. Get at least 150 minutes of aerobic exercise per week. Minimize salt intake. Minimize alcohol intake  DASH Eating Plan DASH stands for "Dietary Approaches to Stop Hypertension." The DASH eating plan is a healthy eating plan that has been shown to reduce high blood pressure (hypertension). Additional health benefits may include reducing the risk of type 2 diabetes mellitus, heart disease, and stroke. The DASH eating plan may also help with weight loss. WHAT DO I NEED TO KNOW ABOUT THE DASH EATING PLAN? For the DASH eating plan, you will follow these general guidelines:  Choose foods with a percent daily value for sodium of less than 5% (as listed on the food label).  Use salt-free seasonings or herbs instead of table salt or sea salt.  Check with your health care provider or pharmacist before using salt substitutes.  Eat lower-sodium products, often labeled as "lower sodium" or "no salt added."  Eat fresh foods.  Eat more vegetables, fruits, and low-fat dairy products.  Choose whole grains. Look for the word "whole" as the first word in the ingredient list.  Choose fish and skinless chicken or [place] more often than red meat. Limit fish, poultry, and meat to 6 oz (170 g) each day.  Limit sweets, desserts, sugars, and sugary drinks.  Choose heart-healthy fats.  Limit cheese to 1 oz (28 g) per day.  Eat more home-cooked food and less restaurant, buffet, and fast food.  Limit fried foods.  Cook foods using methods other than frying.  Limit canned vegetables. If you do use them, rinse them well to  decrease the sodium.  When eating at a restaurant, ask that your food be prepared with less salt, or no salt if possible. WHAT FOODS CAN I EAT? Seek help from a dietitian for individual calorie needs. Grains Whole grain or whole wheat bread. Brown rice. Whole grain or whole wheat pasta. Quinoa, bulgur, and whole grain cereals. Low-sodium cereals. Corn or whole wheat flour tortillas. Whole grain cornbread. Whole grain crackers. Low-sodium crackers. Vegetables Fresh or frozen vegetables (raw, steamed, roasted, or grilled). Low-sodium or reduced-sodium tomato and vegetable juices. Low-sodium or reduced-sodium tomato sauce and paste. Low-sodium or reduced-sodium canned vegetables.  Fruits All fresh, canned (in natural juice), or frozen fruits. Meat and Other Protein Products Ground beef (85% or leaner), grass-fed beef, or beef trimmed of fat. Skinless chicken or [place]. Ground chicken or [place]. Pork trimmed of fat. All fish and seafood. Eggs. Dried beans, peas, or lentils. Unsalted nuts and seeds. Unsalted canned beans. Dairy Low-fat dairy products, such as skim or 1% milk, 2% or reduced-fat cheeses, low-fat ricotta or cottage cheese, or plain low-fat yogurt. Low-sodium or reduced-sodium cheeses. Fats and Oils Tub margarines without trans fats. Light or reduced-fat mayonnaise and salad dressings (reduced sodium). Avocado. Safflower, olive, or canola oils. Natural peanut or almond butter. Other Unsalted popcorn and pretzels. The items listed above may not be a complete list of recommended foods or beverages. Contact your dietitian for more options. WHAT FOODS ARE NOT RECOMMENDED? Grains White bread. White pasta. White rice. Refined cornbread. Bagels and croissants. Crackers that contain  trans fat. Vegetables Creamed or fried vegetables. Vegetables in a cheese sauce. Regular canned vegetables. Regular canned tomato sauce and paste. Regular tomato and vegetable juices. Fruits Dried fruits. Canned  fruit in light or heavy syrup. Fruit juice. Meat and Other Protein Products Fatty cuts of meat. Ribs, chicken wings, bacon, sausage, bologna, salami, chitterlings, fatback, hot dogs, bratwurst, and packaged luncheon meats. Salted nuts and seeds. Canned beans with salt. Dairy Whole or 2% milk, cream, half-and-half, and cream cheese. Whole-fat or sweetened yogurt. Full-fat cheeses or blue cheese. Nondairy creamers and whipped toppings. Processed cheese, cheese spreads, or cheese curds. Condiments Onion and garlic salt, seasoned salt, table salt, and sea salt. Canned and packaged gravies. Worcestershire sauce. Tartar sauce. Barbecue sauce. Teriyaki sauce. Soy sauce, including reduced sodium. Steak sauce. Fish sauce. Oyster sauce. Cocktail sauce. Horseradish. Ketchup and mustard. Meat flavorings and tenderizers. Bouillon cubes. Hot sauce. Tabasco sauce. Marinades. Taco seasonings. Relishes. Fats and Oils Butter, stick margarine, lard, shortening, ghee, and bacon fat. Coconut, palm kernel, or palm oils. Regular salad dressings. Other Pickles and olives. Salted popcorn and pretzels. The items listed above may not be a complete list of foods and beverages to avoid. Contact your dietitian for more information. WHERE CAN I FIND MORE INFORMATION? National Heart, Lung, and Blood Institute: www.nhlbi.nih.gov/health/health-topics/topics/dash/ Document Released: 01/23/2011 Document Revised: 06/20/2013 Document Reviewed: 12/08/2012 ExitCare Patient Information 2015 [employer]. This information is not intended to replace advice given to you by your health care provider. Make sure you discuss any questions you have with your health care provider.  

## 2015-09-03 ENCOUNTER — Other Ambulatory Visit: Payer: Self-pay

## 2015-09-03 MED ORDER — MELOXICAM 15 MG PO TABS: ORAL_TABLET | ORAL | Status: DC

## 2015-09-05 ENCOUNTER — Other Ambulatory Visit: Payer: Self-pay | Admitting: *Deleted

## 2015-09-05 MED ORDER — MELOXICAM 15 MG PO TABS: ORAL_TABLET | ORAL | Status: DC

## 2015-10-02 ENCOUNTER — Other Ambulatory Visit: Payer: Self-pay | Admitting: *Deleted

## 2015-10-02 MED ORDER — VERAPAMIL HCL ER 360 MG PO CP24: 360.0000 mg | ORAL_CAPSULE | Freq: Every day | ORAL | 1 refills | Status: DC

## 2015-10-04 ENCOUNTER — Other Ambulatory Visit: Payer: Self-pay

## 2015-10-04 MED ORDER — VERAPAMIL HCL ER 360 MG PO CP24: 360.0000 mg | ORAL_CAPSULE | Freq: Every day | ORAL | 1 refills | Status: DC

## 2015-10-11 ENCOUNTER — Ambulatory Visit (INDEPENDENT_AMBULATORY_CARE_PROVIDER_SITE_OTHER): Payer: 59 | Admitting: Internal Medicine

## 2015-10-11 VITALS — BP 148/84 | HR 68 | Temp 98.0°F | Ht 71.0 in | Wt 192.0 lb

## 2015-10-11 DIAGNOSIS — J324 Chronic pansinusitis: Secondary | ICD-10-CM | POA: Diagnosis not present

## 2015-10-11 DIAGNOSIS — J300 Vasomotor rhinitis: Secondary | ICD-10-CM

## 2015-10-11 DIAGNOSIS — R221 Localized swelling, mass and lump, neck: Secondary | ICD-10-CM

## 2015-10-11 DIAGNOSIS — R220 Localized swelling, mass and lump, head: Secondary | ICD-10-CM | POA: Diagnosis not present

## 2015-10-11 MED ORDER — IPRATROPIUM BROMIDE 0.03 % NA SOLN: 2.0000 | Freq: Three times a day (TID) | NASAL | 2 refills | Status: DC

## 2015-10-11 MED ORDER — AMOXICILLIN-POT CLAVULANATE 875-125 MG PO TABS
1.0000 | ORAL_TABLET | Freq: Two times a day (BID) | ORAL | 0 refills | Status: DC
Start: 2015-10-11 — End: 2016-11-20

## 2015-10-11 MED ORDER — PREDNISONE 20 MG PO TABS: ORAL_TABLET | ORAL | 0 refills | Status: DC

## 2015-10-11 NOTE — Progress Notes (Signed)
HPI  Patient presents to the office for evaluation of cough and congestion.  It has been going on for 2 weeks.  Patient reports throat clearing cough.  They also endorse change in voice, postnasal drip and headaches, sinus congestion,  gland swelling under the left side of the jaw, sore throat.  .  They have tried afrin, ibuprofen.  They report that nothing has worked.  They denies other sick contacts.  He does note that he has some swelling to the right side of the jaw that started 2 hours after lunch.  It is really tender.  He has tried nothing for it.  He is still an every day current smoker.    Review of Systems  Constitutional: Positive for malaise/fatigue. Negative for chills and fever.  HENT: Positive for congestion, ear pain, hearing loss and sore throat.        Facial swelling  Respiratory: Positive for cough. Negative for sputum production, shortness of breath and wheezing.   Cardiovascular: Negative for chest pain, palpitations and leg swelling.  Neurological: Positive for headaches.    PE:  Vitals:   10/11/15 1554  BP: (!) 148/84  Pulse: 68  Temp: 98 F (36.7 C)    General:  Alert and non-toxic, WDWN, NAD HEENT: NCAT, PERLA, EOM normal, no occular discharge or erythema.  Nasal mucosal edema with sinus tenderness to palpation.  Oropharynx clear with minimal oropharyngeal edema and erythema.  Mucous membranes moist and pink.  Swelling to the right submandibular gland with tenderness to palpation.  No swelling of the gums or gingiva.  No swelling to the buccal mucosa or the floor of the mouth.   Neck:  Cervical adenopathy Chest:  RRR no MRGs.  Lungs clear to auscultation A&P with no wheezes rhonchi or rales.   Abdomen: +BS x 4 quadrants, soft, non-tender, no guarding, rigidity, or rebound. Skin: warm and dry no rash Neuro: A&Ox4, CN II-XII grossly intact  Assessment and Plan:   1. Chronic pansinusitis -augmentin -prednisone -atrovent  2. Vasomotor  rhinitis -recommended stopping afrin  3. Submandibular swelling -sialoadenitis vs. Lymphadenopathy -warm compresses -augmentin -prednisone -if no improvement given tobacco abuse recommend CT with contrast -due to see Dr. Oneta RackMcKeown in a week for a CPE   Patient aware to go to ER if worsening shortness of breath or difficulty swallowing

## 2015-10-11 NOTE — Patient Instructions (Signed)
Salivary Stone A salivary stone is a mineral deposit that builds up in the ducts that drain your salivary glands. Most salivary gland stones are made of calcium. When a stone forms, saliva can back up into the gland and cause painful swelling. Your salivary glands are the glands that produce spit (saliva). You have six major salivary glands. Each gland has a duct that carries saliva into your mouth. Saliva keeps your mouth moist and breaks down the food that you eat. It also helps to prevent tooth decay. Two salivary glands are located just in front of your ears (parotid). The ducts for these glands open up inside your cheeks, near your back teeth. You also have two glands under your tongue (sublingual) and two glands under your jaw (submandibular). The ducts for these glands open under your tongue. A stone can form in any salivary gland. The most common place for a salivary stone to develop is in a submandibular salivary gland. CAUSES Any condition that reduces the flow of saliva may lead to stone formation. It is not known why some people form stones and others do not.  RISK FACTORS You may be more likely to develop a salivary stone if you:  Are male.  Do not drink enough water.  Smoke.  Have high blood pressure.  Have gout.  Have diabetes. SIGNS AND SYMPTOMS The main sign of a salivary gland stone is sudden swelling of a salivary gland when eating. This usually happens under the jaw on one side. Other signs and symptoms include:  Swelling of the cheek or under the tongue when eating.  Pain in the swollen area.  Trouble chewing or swallowing.  Swelling that goes down after eating. DIAGNOSIS Your health care provider may diagnose a salivary gland stone based on your signs and symptoms. The health care provider will also do a physical exam. In many cases, a stone can be felt in a duct inside your mouth. You may need to see an ear, nose, and throat specialist (ENT or otolaryngologist)  for diagnosis and treatment. You may also need to have diagnostic tests. These may include imaging studies to check for a stone, such as:  X-rays.  Ultrasound.  CT scan.  MRI. TREATMENT Home care may be enough to treat a small stone that is not causing symptoms. Treatment of a stone that is large enough to cause symptoms may include:  Probing and widening the duct to allow the stone to pass.  Inserting a thin, flexible scope (endoscope) into the duct to locate and remove the stone.  Breaking up the stone with sound waves.  Removing the entire salivary gland. HOME CARE INSTRUCTIONS  Drink enough fluid to keep your urine clear or pale yellow.  Follow these instructions every few hours:  Suck on a lemon candy to stimulate the flow of saliva.  Put a hot compress over the gland.  Gently massage the gland.  Do not use any tobacco products, including cigarettes, chewing tobacco, or electronic cigarettes. If you need help quitting, ask your health care provider. SEEK MEDICAL CARE IF:  You have pain and swelling in your face, jaw, or mouth after eating.  You have persistent swelling in any of these places:  In front of your ear.  Under your jaw.  Inside your mouth. SEEK IMMEDIATE MEDICAL CARE IF:  You have pain and swelling in your face, jaw, or mouth that are getting worse.  Your pain and swelling make it hard to swallow or breathe.     This information is not intended to replace advice given to you by your health care provider. Make sure you discuss any questions you have with your health care provider.   Document Released: 03/13/2004 Document Revised: 02/24/2014 Document Reviewed: 07/06/2013 Elsevier Interactive Patient Education 2016 Elsevier Inc.  

## 2015-10-18 ENCOUNTER — Ambulatory Visit (INDEPENDENT_AMBULATORY_CARE_PROVIDER_SITE_OTHER): Payer: 59 | Admitting: Internal Medicine

## 2015-10-18 ENCOUNTER — Encounter: Payer: Self-pay | Admitting: Internal Medicine

## 2015-10-18 VITALS — BP 132/88 | HR 72 | Temp 97.5°F | Resp 16 | Ht 70.5 in | Wt 191.2 lb

## 2015-10-18 DIAGNOSIS — E785 Hyperlipidemia, unspecified: Secondary | ICD-10-CM

## 2015-10-18 DIAGNOSIS — Z125 Encounter for screening for malignant neoplasm of prostate: Secondary | ICD-10-CM

## 2015-10-18 DIAGNOSIS — Z0000 Encounter for general adult medical examination without abnormal findings: Secondary | ICD-10-CM | POA: Diagnosis not present

## 2015-10-18 DIAGNOSIS — E559 Vitamin D deficiency, unspecified: Secondary | ICD-10-CM

## 2015-10-18 DIAGNOSIS — Z79899 Other long term (current) drug therapy: Secondary | ICD-10-CM

## 2015-10-18 DIAGNOSIS — Z136 Encounter for screening for cardiovascular disorders: Secondary | ICD-10-CM | POA: Diagnosis not present

## 2015-10-18 DIAGNOSIS — R7303 Prediabetes: Secondary | ICD-10-CM

## 2015-10-18 DIAGNOSIS — I10 Essential (primary) hypertension: Secondary | ICD-10-CM

## 2015-10-18 DIAGNOSIS — Z0001 Encounter for general adult medical examination with abnormal findings: Secondary | ICD-10-CM | POA: Insufficient documentation

## 2015-10-18 DIAGNOSIS — R5383 Other fatigue: Secondary | ICD-10-CM

## 2015-10-18 DIAGNOSIS — Z111 Encounter for screening for respiratory tuberculosis: Secondary | ICD-10-CM | POA: Diagnosis not present

## 2015-10-18 DIAGNOSIS — Z1212 Encounter for screening for malignant neoplasm of rectum: Secondary | ICD-10-CM

## 2015-10-18 LAB — HEPATIC FUNCTION PANEL
ALBUMIN: 4.2 g/dL (ref 3.6–5.1)
ALK PHOS: 57 U/L (ref 40–115)
ALT: 59 U/L — ABNORMAL HIGH (ref 9–46)
AST: 40 U/L — ABNORMAL HIGH (ref 10–35)
BILIRUBIN TOTAL: 0.7 mg/dL (ref 0.2–1.2)
Bilirubin, Direct: 0.1 mg/dL (ref <=0.2)
Indirect Bilirubin: 0.6 mg/dL (ref 0.2–1.2)
Total Protein: 6.9 g/dL (ref 6.1–8.1)

## 2015-10-18 LAB — CBC WITH DIFFERENTIAL/PLATELET
BASOS PCT: 0 %
Basophils Absolute: 0 cells/uL (ref 0–200)
EOS ABS: 0 {cells}/uL — AB (ref 15–500)
EOS PCT: 0 %
HCT: 45.9 % (ref 38.5–50.0)
Hemoglobin: 16 g/dL (ref 13.2–17.1)
LYMPHS PCT: 16 %
Lymphs Abs: 1568 cells/uL (ref 850–3900)
MCH: 33.3 pg — ABNORMAL HIGH (ref 27.0–33.0)
MCHC: 34.9 g/dL (ref 32.0–36.0)
MCV: 95.4 fL (ref 80.0–100.0)
MONOS PCT: 5 %
MPV: 9 fL (ref 7.5–12.5)
Monocytes Absolute: 490 cells/uL (ref 200–950)
Neutro Abs: 7742 cells/uL (ref 1500–7800)
Neutrophils Relative %: 79 %
PLATELETS: 279 10*3/uL (ref 140–400)
RBC: 4.81 MIL/uL (ref 4.20–5.80)
RDW: 12.9 % (ref 11.0–15.0)
WBC: 9.8 10*3/uL (ref 3.8–10.8)

## 2015-10-18 LAB — BASIC METABOLIC PANEL WITH GFR
BUN: 18 mg/dL (ref 7–25)
CO2: 19 mmol/L — ABNORMAL LOW (ref 20–31)
CREATININE: 1.07 mg/dL (ref 0.70–1.33)
Calcium: 9.8 mg/dL (ref 8.6–10.3)
Chloride: 105 mmol/L (ref 98–110)
GFR, EST AFRICAN AMERICAN: 89 mL/min (ref >=60)
GFR, Est Non African American: 77 mL/min (ref >=60)
Glucose, Bld: 141 mg/dL — ABNORMAL HIGH (ref 65–99)
POTASSIUM: 4.4 mmol/L (ref 3.5–5.3)
Sodium: 138 mmol/L (ref 135–146)

## 2015-10-18 LAB — LIPID PANEL
Cholesterol: 204 mg/dL — ABNORMAL HIGH (ref 125–200)
HDL: 78 mg/dL (ref >=40)
LDL Cholesterol: 98 mg/dL (ref <130)
TRIGLYCERIDES: 140 mg/dL (ref <150)
Total CHOL/HDL Ratio: 2.6 Ratio (ref <=5.0)
VLDL: 28 mg/dL (ref <30)

## 2015-10-18 LAB — IRON AND TIBC
%SAT: 28 % (ref 15–60)
Iron: 118 ug/dL (ref 50–180)
TIBC: 424 ug/dL (ref 250–425)
UIBC: 306 ug/dL (ref 125–400)

## 2015-10-18 LAB — MAGNESIUM: MAGNESIUM: 2.3 mg/dL (ref 1.5–2.5)

## 2015-10-18 LAB — TSH: TSH: 1.46 mIU/L (ref 0.40–4.50)

## 2015-10-18 LAB — TESTOSTERONE: TESTOSTERONE: 404 ng/dL (ref 250–827)

## 2015-10-18 LAB — PSA: PSA: 0.3 ng/mL (ref <=4.0)

## 2015-10-18 LAB — VITAMIN B12: Vitamin B-12: 464 pg/mL (ref 200–1100)

## 2015-10-18 NOTE — Progress Notes (Signed)
[facility]   Lucky Cowboy, M.D.    Dyanne Carrel. Steffanie Dunn, P.A.-C      Terri Piedra, P.A.-C  [facility]                [address]                [place], [place]. [address] Telephone [phone] Telefax [phone]  Annual  Screening/Preventative Visit  And Comprehensive Evaluation & Examination     This very nice 55y.o.MWM presents for a Wellness/Preventative Visit & comprehensive evaluation and management of multiple medical co-morbidities.  Patient has been followed for HTN, Prediabetes, Hyperlipidemia and Vitamin D Deficiency.     HTN predates since circa 2004. Patient's BP has been controlled at home.Today's BP is 132/88. Patient denies any cardiac symptoms as chest pain, palpitations, shortness of breath, dizziness or ankle swelling.    Patient's hyperlipidemia is controlled with diet and medications. Patient denies myalgias or other medication SE's. Last lipids were at goal: Lab Results  Component Value Date   CHOL 175 09/27/2014   HDL 48 09/27/2014   LDLCALC 93 09/27/2014   TRIG 168 (H) 09/27/2014   CHOLHDL 3.6 09/27/2014      Patient has prediabetes since    and patient denies reactive hypoglycemic symptoms, visual blurring, diabetic polys or paresthesias. Last A1c was at goal: Lab Results  Component Value Date   HGBA1C 5.3 09/27/2014       Finally, patient has history of Vitamin D Deficiency of "101" in 2008 and last vitamin D was at goal: Lab Results  Component Value Date   VD25OH 69 09/27/2014   Current Outpatient Prescriptions on File Prior to Visit  Medication Sig  . acyclovir (ZOVIRAX) 200 MG capsule   . ALPRAZolam (XANAX) 0.5 MG tablet Take 1 tablet (0.5 mg total) by mouth at bedtime as needed for anxiety.  Marland Kitchen aspirin 325 MG tablet Take 325 mg by mouth daily.  . cetirizine (ZYRTEC ALLERGY) 10 MG tablet Take 10 mg by mouth daily.  . Cholecalciferol (VITAMIN D PO) Take 5,000 Int'l Units by  mouth daily.   . Flaxseed, Linseed, (FLAXSEED OIL PO) Take by mouth daily.  . ATROVENT 0.03 % nasal spray Place 2 sprays into the nose 3  times daily.  . meloxicam 15 MG Take 1 tablet by mouth  daily  . montelukast  10 MG t Take 1 tab daily.  . Omega-3 FISH OIL Take by mouth daily.  . Red Yeast Rice Extract  Take by mouth daily.  . sildenafil  100 MG tablet Take 1 tab as needed for erectile dysfunction.  . SUMAtriptan (IMITREX) 20  nas spry Place 1 nasal spray every 2  hours as needed   . Verapamil-PM 360 MG 24 hr cap Take 1 capsule (360 mg total) by mouth at bedtime.   Allergies  Allergen Reactions  . Topamax [Topiramate]    Past Medical History:  Diagnosis Date  . Cluster headache   . Hyperlipidemia   . Hypertension   . Prediabetes   . Vitamin D deficiency    Health Maintenance  Topic Date Due  . COLONOSCOPY  04/24/2008  . INFLUENZA VACCINE  09/18/2015  . TETANUS/TDAP  04/09/2018  . Hepatitis C Screening  Completed  . HIV Screening  Completed   Immunization History  Administered Date(s) Administered  . PPD Test 09/26/2013, 09/27/2014, 10/18/2015  . Pneumococcal-Unspecified 07/24/2008  . Td 04/14/2006, 04/09/2008   Past Surgical History:  Procedure Laterality Date  . APPENDECTOMY  1972  . INCISE AND DRAIN ABCESS  2008    right shoulder  . PLEURAL SCARIFICATION Left 1979   chest tube thoracostomy   Family History  Problem Relation Age of Onset  . Stroke Father    Social History   Social History  . Marital status: Married    Spouse name: N/A  . Number of children: N/A  . Years of education: N/A   Occupational History  . 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   Social History Main Topics  . Smoking status: Current Every Day Smoker  . Smokeless tobacco: Not on file     Comment: smokes 5-6 cigarettes daily  . Alcohol use 0.0 oz/week     Comment: occ beer  . Drug use: No  . Sexual activity: Active    ROS Constitutional:  Denies fever, chills, weight loss/gain, headaches, insomnia,  night sweats or change in appetite. Does c/o fatigue. Eyes: Denies redness, blurred vision, diplopia, discharge, itchy or watery eyes.  ENT: Denies discharge, congestion, post nasal drip, epistaxis, sore throat, earache, hearing loss, dental pain, Tinnitus, Vertigo, Sinus pain or snoring.  Cardio: Denies chest pain, palpitations, irregular heartbeat, syncope, dyspnea, diaphoresis, orthopnea, PND, claudication or edema Respiratory: denies cough, dyspnea, DOE, pleurisy, hoarseness, laryngitis or wheezing.  Gastrointestinal: Denies dysphagia, heartburn, reflux, water brash, pain, cramps, nausea, vomiting, bloating, diarrhea, constipation, hematemesis, melena, hematochezia, jaundice or hemorrhoids Genitourinary: Denies dysuria, frequency, urgency, nocturia, hesitancy, discharge, hematuria or flank pain Musculoskeletal: Denies arthralgia, myalgia, stiffness, Jt. Swelling, pain, limp or strain/sprain. Denies Falls. Skin: Denies puritis, rash, hives, warts, acne, eczema or change in skin lesion Neuro: No weakness, tremor, incoordination, spasms, paresthesia or pain Psychiatric: Denies confusion, memory loss or sensory loss. Denies Depression. Endocrine: Denies change in weight, skin, hair change, nocturia, and paresthesia, diabetic polys, visual blurring or hyper / hypo glycemic episodes.  Heme/Lymph: No excessive bleeding, bruising or enlarged lymph nodes.  Physical Exam  BP 132/88   Pulse 72   Temp 97.5 F (36.4 C)   Resp 16   Ht 5' 10.5" (1.791 m)   Wt 191 lb 3.2 oz (86.7 kg)   BMI 27.05 kg/m   General Appearance: Well nourished, in no apparent distress.  Eyes: PERRLA, EOMs, conjunctiva no swelling or erythema, normal fundi and vessels. Sinuses: No frontal/maxillary tenderness ENT/Mouth: EACs patent / TMs  nl. Nares clear without erythema, swelling, mucoid exudates. Oral hygiene is good. No erythema, swelling, or exudate. Tongue  normal, non-obstructing. Tonsils not swollen or erythematous. Hearing normal.  Neck: Supple, thyroid normal. No bruits, nodes or JVD. Respiratory: Respiratory effort normal.  BS equal and clear bilateral without rales, rhonci, wheezing or stridor. Cardio: Heart sounds are normal with regular rate and rhythm and no murmurs, rubs or gallops. Peripheral pulses are normal and equal bilaterally without edema. No aortic or femoral bruits. Chest: symmetric with normal excursions and percussion.  Abdomen: Soft, with Nl bowel sounds. Nontender, no guarding, rebound, hernias, masses, or organomegaly.  Lymphatics: Non tender without lymphadenopathy.  Genitourinary: No hernias.Testes nl. DRE - prostate nl for age - smooth & firm w/o nodules. Musculoskeletal: Full ROM all peripheral extremities, joint stability, 5/5 strength, and normal gait. Skin: Warm and dry without rashes, lesions, cyanosis, clubbing or  ecchymosis.  Neuro: Cranial nerves intact, reflexes equal bilaterally. Normal muscle tone, no cerebellar symptoms. Sensation intact.  Pysch: Alert and oriented X 3 with normal affect, insight and judgment appropriate.   Assessment  and Plan  1. Encounter for general adult medical examination with abnormal findings  - Microalbumin / creatinine urine ratio - EKG 12-Lead - [place], RETROPERITNL ABD,  LTD - POC Hemoccult Bld/Stl (3-Cd Home Screen); Future - Urinalysis, Routine w reflex microscopic (not at [facility]) - Vitamin B12 - Iron and TIBC - PSA - Testosterone - CBC with Differential/Platelet - BASIC METABOLIC PANEL WITH GFR - Hepatic function panel - Magnesium - Lipid panel - TSH - Hemoglobin A1c - Insulin, random - VITAMIN D 25 Hydroxy (Vit-D Deficiency, Fractures)  2. Essential hypertension  - Microalbumin / creatinine urine ratio - EKG 12-Lead - [place], RETROPERITNL ABD,  LTD - TSH  3. Hyperlipidemia  - Lipid panel - TSH  4. Prediabetes  - Hemoglobin A1c - Insulin, random  5.  Vitamin D deficiency  - VITAMIN D 25 Hydroxy (Vit-D Deficiency, Fractures)  6. Screening for rectal cancer  - POC Hemoccult Bld/Stl (3-Cd Home Screen); Future  7. Prostate cancer screening  - PSA  8. Other fatigue  - Vitamin B12 - Iron and TIBC - Testosterone - CBC with Differential/Platelet - TSH  9. Medication management  - Urinalysis, Routine w reflex microscopic (not at [facility]) - CBC with Differential/Platelet - BASIC METABOLIC PANEL WITH GFR - Hepatic function panel - Magnesium  10. Screening examination for pulmonary tuberculosis  - PPD     Continue prudent diet as discussed, weight control, BP monitoring, regular exercise, and medications as discussed.  Discussed med effects and SE's. Routine screening labs and tests as requested with regular follow-up as recommended. Over 40 minutes of exam, counseling, chart review and high complex critical decision making was performed

## 2015-10-18 NOTE — Patient Instructions (Addendum)

## 2015-10-19 LAB — MICROALBUMIN / CREATININE URINE RATIO
Creatinine, Urine: 180 mg/dL (ref 20–370)
MICROALB UR: 0.4 mg/dL
MICROALB/CREAT RATIO: 2 ug/mg{creat} (ref <30)

## 2015-10-19 LAB — URINALYSIS, ROUTINE W REFLEX MICROSCOPIC
BILIRUBIN URINE: NEGATIVE
GLUCOSE, UA: NEGATIVE
Hgb urine dipstick: NEGATIVE
KETONES UR: NEGATIVE
Leukocytes, UA: NEGATIVE
Nitrite: NEGATIVE
PH: 6.5 (ref 5.0–8.0)
Protein, ur: NEGATIVE
SPECIFIC GRAVITY, URINE: 1.02 (ref 1.001–1.035)

## 2015-10-19 LAB — INSULIN, RANDOM: INSULIN: 71.1 u[IU]/mL — AB (ref 2.0–19.6)

## 2015-10-19 LAB — HEMOGLOBIN A1C
Hgb A1c MFr Bld: 5.5 % (ref <5.7)
Mean Plasma Glucose: 111 mg/dL

## 2015-10-19 LAB — VITAMIN D 25 HYDROXY (VIT D DEFICIENCY, FRACTURES): VIT D 25 HYDROXY: 70 ng/mL (ref 30–100)

## 2015-10-23 ENCOUNTER — Other Ambulatory Visit: Payer: Self-pay | Admitting: *Deleted

## 2015-10-23 MED ORDER — ALPRAZOLAM 0.5 MG PO TABS: 0.5000 mg | ORAL_TABLET | Freq: Every evening | ORAL | 1 refills | Status: DC | PRN

## 2015-11-06 ENCOUNTER — Other Ambulatory Visit: Payer: Self-pay | Admitting: *Deleted

## 2015-11-06 MED ORDER — IPRATROPIUM BROMIDE 0.03 % NA SOLN: 2.0000 | Freq: Three times a day (TID) | NASAL | 2 refills | Status: DC

## 2015-11-19 ENCOUNTER — Other Ambulatory Visit: Payer: Self-pay | Admitting: Internal Medicine

## 2016-01-14 ENCOUNTER — Other Ambulatory Visit: Payer: Self-pay | Admitting: *Deleted

## 2016-01-14 MED ORDER — VERAPAMIL HCL ER 360 MG PO CP24: 360.0000 mg | ORAL_CAPSULE | Freq: Every day | ORAL | 1 refills | Status: DC

## 2016-01-16 ENCOUNTER — Other Ambulatory Visit: Payer: Self-pay | Admitting: *Deleted

## 2016-01-16 MED ORDER — VERAPAMIL HCL ER 360 MG PO CP24: 360.0000 mg | ORAL_CAPSULE | Freq: Every day | ORAL | 1 refills | Status: DC

## 2016-03-15 ENCOUNTER — Encounter: Payer: Self-pay | Admitting: Internal Medicine

## 2016-03-16 ENCOUNTER — Other Ambulatory Visit: Payer: Self-pay | Admitting: Internal Medicine

## 2016-03-16 MED ORDER — MELOXICAM 15 MG PO TABS: ORAL_TABLET | ORAL | 1 refills | Status: DC

## 2016-04-21 ENCOUNTER — Encounter: Payer: Self-pay | Admitting: Physician Assistant

## 2016-04-22 MED ORDER — VERAPAMIL HCL ER 360 MG PO CP24
360.0000 mg | ORAL_CAPSULE | Freq: Every day | ORAL | 1 refills | Status: DC
Start: 2016-04-22 — End: 2016-09-20

## 2016-07-12 ENCOUNTER — Other Ambulatory Visit: Payer: Self-pay | Admitting: Internal Medicine

## 2016-07-12 ENCOUNTER — Encounter: Payer: Self-pay | Admitting: Internal Medicine

## 2016-07-12 MED ORDER — MELOXICAM 15 MG PO TABS: ORAL_TABLET | ORAL | 3 refills | Status: DC

## 2016-08-05 ENCOUNTER — Telehealth: Payer: Self-pay | Admitting: Physician Assistant

## 2016-08-05 ENCOUNTER — Encounter: Payer: Self-pay | Admitting: Internal Medicine

## 2016-08-05 ENCOUNTER — Other Ambulatory Visit: Payer: Self-pay | Admitting: Physician Assistant

## 2016-08-05 ENCOUNTER — Encounter: Payer: Self-pay | Admitting: Physician Assistant

## 2016-08-05 MED ORDER — AZITHROMYCIN 250 MG PO TABS: ORAL_TABLET | ORAL | 1 refills | Status: AC

## 2016-08-05 MED ORDER — PROMETHAZINE-DM 6.25-15 MG/5ML PO SYRP: 5.0000 mL | ORAL_SOLUTION | Freq: Four times a day (QID) | ORAL | 1 refills | Status: DC | PRN

## 2016-08-05 MED ORDER — PREDNISONE 20 MG PO TABS: ORAL_TABLET | ORAL | 0 refills | Status: DC

## 2016-08-05 NOTE — Telephone Encounter (Signed)
Patient called with 1 week of cough, yellow mucus, not improving.   Suggest getting on allergy pill, using prednisone, and mucinex, if not better in 3 days can start antibiotic.   If not better in 7-10 days needs office visit.  If worse or any shortness of breath or chest pain go to the ER

## 2016-09-20 ENCOUNTER — Other Ambulatory Visit: Payer: Self-pay | Admitting: Physician Assistant

## 2016-11-20 ENCOUNTER — Ambulatory Visit (INDEPENDENT_AMBULATORY_CARE_PROVIDER_SITE_OTHER): Payer: 59 | Admitting: Internal Medicine

## 2016-11-20 ENCOUNTER — Other Ambulatory Visit: Payer: Self-pay | Admitting: Internal Medicine

## 2016-11-20 ENCOUNTER — Encounter: Payer: Self-pay | Admitting: Internal Medicine

## 2016-11-20 VITALS — BP 129/78 | HR 59 | Temp 97.7°F | Resp 18 | Ht 70.5 in | Wt 192.4 lb

## 2016-11-20 DIAGNOSIS — Z79899 Other long term (current) drug therapy: Secondary | ICD-10-CM

## 2016-11-20 DIAGNOSIS — Z111 Encounter for screening for respiratory tuberculosis: Secondary | ICD-10-CM

## 2016-11-20 DIAGNOSIS — Z1212 Encounter for screening for malignant neoplasm of rectum: Secondary | ICD-10-CM

## 2016-11-20 DIAGNOSIS — E782 Mixed hyperlipidemia: Secondary | ICD-10-CM

## 2016-11-20 DIAGNOSIS — I10 Essential (primary) hypertension: Secondary | ICD-10-CM

## 2016-11-20 DIAGNOSIS — Z136 Encounter for screening for cardiovascular disorders: Secondary | ICD-10-CM | POA: Diagnosis not present

## 2016-11-20 DIAGNOSIS — Z0000 Encounter for general adult medical examination without abnormal findings: Secondary | ICD-10-CM

## 2016-11-20 DIAGNOSIS — Z0001 Encounter for general adult medical examination with abnormal findings: Secondary | ICD-10-CM

## 2016-11-20 DIAGNOSIS — R7303 Prediabetes: Secondary | ICD-10-CM

## 2016-11-20 DIAGNOSIS — Z125 Encounter for screening for malignant neoplasm of prostate: Secondary | ICD-10-CM

## 2016-11-20 DIAGNOSIS — R5383 Other fatigue: Secondary | ICD-10-CM

## 2016-11-20 DIAGNOSIS — E559 Vitamin D deficiency, unspecified: Secondary | ICD-10-CM

## 2016-11-20 MED ORDER — ATENOLOL 100 MG PO TABS: ORAL_TABLET | ORAL | 3 refills | Status: DC

## 2016-11-20 NOTE — Patient Instructions (Signed)

## 2016-11-20 NOTE — Progress Notes (Signed)
[facility]   Lucky Cowboy, M.D.     Dyanne Carrel. Steffanie Dunn, P.A.-C Judd Gaudier, DNP [facility]                [address]                [place], [place]. [address] Telephone [phone] Telefax [phone] Annual  Screening/Preventative Visit  & Comprehensive Evaluation & Examination     Keith Castro is a very nice 56 y.o. MWM presents for a Screening/Preventative Visit & comprehensive evaluation and management of multiple medical co-morbidities.  Patient has been followed for HTN, Prediabetes, Hyperlipidemia and Vitamin D Deficiency.     HTN predates since 2004. Patient's BP has been controlled at home.  Today's BP is at goal - 129/78. Patient denies any cardiac symptoms as chest pain, palpitations, shortness of breath, dizziness or ankle swelling.     Patient's hyperlipidemia is controlled with diet and medications. Patient denies myalgias or other medication SE's. Last lipids were at goal: Lab Results  Component Value Date   CHOL 204 (H) 10/18/2015   HDL 78 10/18/2015   LDLCALC 98 10/18/2015   TRIG 140 10/18/2015   CHOLHDL 2.6 10/18/2015      Patient has hx/o prediabetes (A1c 5.7% in 2011)  and patient denies reactive hypoglycemic symptoms, visual blurring, diabetic polys or paresthesias. Last A1c was at goal: Lab Results  Component Value Date   HGBA1C 5.5 10/18/2015       Finally, patient has history of Vitamin D Deficiency of ("33" in 2011) and last vitamin D was at goal: Lab Results  Component Value Date   VD25OH 20 10/18/2015   Current Outpatient Prescriptions on File Prior to Visit  Medication Sig  . acyclovir (ZOVIRAX) 200 MG capsule   . ALPRAZolam (XANAX) 0.5 MG tablet Take 1 tablet (0.5 mg total) by mouth at bedtime as needed for anxiety.  Marland Kitchen aspirin 325 MG tablet Take 325 mg by mouth daily.  . cetirizine (ZYRTEC ALLERGY) 10 MG tablet Take 10 mg by mouth daily.  . Cholecalciferol (VITAMIN D PO) Take  5,000 Int'l Units by mouth daily.   . Flaxseed, Linseed, (FLAXSEED OIL PO) Take by mouth daily.  . meloxicam (MOBIC) 15 MG tablet Take 1/2 to 1 tablet daily with food for pain & inflammation  . Omega-3 Fatty Acids (FISH OIL PO) Take by mouth daily.  . Red Yeast Rice Extract (RED YEAST RICE PO) Take by mouth daily.  . sildenafil (REVATIO) 20 MG tablet TAKE 1 TO 5 TABLETS BY MOUTH DAILY AS NEEDED  . SUMAtriptan (IMITREX) 20 MG/ACT nasal spray Place 1 spray (20 mg total) into the nose every 2 (two) hours as needed (cluster headache needs 1 month).  . verapamil (VERELAN PM) 360 MG 24 hr capsule TAKE 1 CAPSULE BY MOUTH AT  BEDTIME  . ipratropium (ATROVENT) 0.03 % nasal spray Place 2 sprays into the nose 3 (three) times daily.  . sildenafil (VIAGRA) 100 MG tablet Take 1 tablet (100 mg total) by mouth as needed for erectile dysfunction.   No current facility-administered medications on file prior to visit.    Allergies  Allergen Reactions  . Topamax [Topiramate]    Past Medical History:  Diagnosis Date  . Cluster headache   . Hyperlipidemia   . Hypertension   . Prediabetes   . Vitamin D deficiency    Health Maintenance  Topic Date Due  . COLONOSCOPY  04/24/2008  . INFLUENZA  VACCINE  09/17/2016  . TETANUS/TDAP  04/09/2018  . Hepatitis C Screening  Completed  . HIV Screening  Completed   Immunization History  Administered Date(s) Administered  . Influenza-Unspecified 11/18/2016  . PPD Test 09/26/2013, 09/27/2014, 10/18/2015, 11/20/2016  . Pneumococcal-Unspecified 07/24/2008  . Td 04/14/2006, 04/09/2008   Past Surgical History:  Procedure Laterality Date  . APPENDECTOMY  1972  . INCISE AND DRAIN ABCESS  2008    right shoulder  . PLEURAL SCARIFICATION Left 1979   chest tube thoracostomy   Family History  Problem Relation Age of Onset  . Stroke Father    Social History  . Marital status: Married    Spouse name: N/A  . Number of children: N/A  . 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 Smoking status: Current Every Day Smoker  . Smokeless tobacco: Not on file     Comment: smokes 5-6 cigarettes daily  . Alcohol use 0.0 oz/week     Comment: occ beer  . Drug use: No  . Sexual activity: Active    ROS Constitutional: Denies fever, chills, weight loss/gain, headaches, insomnia,  night sweats or change in appetite. Does c/o fatigue. Eyes: Denies redness, blurred vision, diplopia, discharge, itchy or watery eyes.  ENT: Denies discharge, congestion, post nasal drip, epistaxis, sore throat, earache, hearing loss, dental pain, Tinnitus, Vertigo, Sinus pain or snoring.  Cardio: Denies chest pain, palpitations, irregular heartbeat, syncope, dyspnea, diaphoresis, orthopnea, PND, claudication or edema Respiratory: denies cough, dyspnea, DOE, pleurisy, hoarseness, laryngitis or wheezing.  Gastrointestinal: Denies dysphagia, heartburn, reflux, water brash, pain, cramps, nausea, vomiting, bloating, diarrhea, constipation, hematemesis, melena, hematochezia, jaundice or hemorrhoids Genitourinary: Denies dysuria, frequency, urgency, nocturia, hesitancy, discharge, hematuria or flank pain Musculoskeletal: Denies arthralgia, myalgia, stiffness, Jt. Swelling, pain, limp or strain/sprain. Denies Falls. Skin: Denies puritis, rash, hives, warts, acne, eczema or change in skin lesion Neuro: No weakness, tremor, incoordination, spasms, paresthesia or pain Psychiatric: Denies confusion, memory loss or sensory loss. Denies Depression. Endocrine: Denies change in weight, skin, hair change, nocturia, and paresthesia, diabetic polys, visual blurring or hyper / hypo glycemic episodes.  Heme/Lymph: No excessive bleeding, bruising or enlarged lymph nodes.  Physical Exam  BP 129/78   Pulse (!) 59   Temp 97.7 F (36.5 C)   Resp 18   Ht 5' 10.5" (1.791 m)   Wt 192 lb 6.4 oz (87.3 kg)   BMI 27.22 kg/m   General  Appearance: Well nourished and well groomed and in no apparent distress.  Eyes: PERRLA, EOMs, conjunctiva no swelling or erythema, normal fundi and vessels. Sinuses: No frontal/maxillary tenderness ENT/Mouth: EACs patent / TMs  nl. Nares clear without erythema, swelling, mucoid exudates. Oral hygiene is good. No erythema, swelling, or exudate. Tongue normal, non-obstructing. Tonsils not swollen or erythematous. Hearing normal.  Neck: Supple, thyroid normal. No bruits, nodes or JVD. Respiratory: Respiratory effort normal.  BS equal and clear bilateral without rales, rhonci, wheezing or stridor. Cardio: Heart sounds are normal with regular rate and rhythm and no murmurs, rubs or gallops. Peripheral pulses are normal and equal bilaterally without edema. No aortic or femoral bruits. Chest: symmetric with normal excursions and percussion.  Abdomen: Soft, with Nl bowel sounds. Nontender, no guarding, rebound, hernias, masses, or organomegaly.  Lymphatics: Non tender without lymphadenopathy.  Genitourinary: No hernias.Testes nl. DRE - prostate nl for age - smooth & firm w/o nodules. Musculoskeletal: Full ROM all peripheral extremities, joint  stability, 5/5 strength, and normal gait. Skin: Warm and dry without rashes, lesions, cyanosis, clubbing or  ecchymosis.  Neuro: Cranial nerves intact, reflexes equal bilaterally. Normal muscle tone, no cerebellar symptoms. Sensation intact.  Pysch: Alert and oriented X 3 with normal affect, insight and judgment appropriate.   Assessment and Plan  1. Annual Preventative/Screening Exam   2. Essential hypertension  - EKG 12-Lead - [place], RETROPERITNL ABD,  LTD - Urinalysis, Routine w reflex microscopic - Microalbumin / creatinine urine ratio - CBC with Differential/Platelet - BASIC METABOLIC PANEL WITH GFR - Magnesium - TSH  3. Hyperlipidemia, mixed  - EKG 12-Lead - [place], RETROPERITNL ABD,  LTD - Hepatic function panel - Lipid panel - TSH  4.  Prediabetes  - EKG 12-Lead - [place], RETROPERITNL ABD,  LTD - Hemoglobin A1c - Insulin, random  5. Vitamin D deficiency  - VITAMIN D 25 Hydroxy  6. Screening for rectal cancer  - POC Hemoccult Bld/Stl   7. Prostate cancer screening  - PSA  8. Screening examination for pulmonary tuberculosis  - PPD  9. Screening for ischemic heart disease  - EKG 12-Lead  10. Screening for AAA (aortic abdominal aneurysm)  - [place], RETROPERITNL ABD,  LTD  11. Fatigue, unspecified type  - Iron,Total/Total Iron Binding Cap - Vitamin B12 - Testosterone - TSH  12. Medication management  - Urinalysis, Routine w reflex microscopic - Microalbumin / creatinine urine ratio - CBC with Differential/Platelet - BASIC METABOLIC PANEL WITH GFR - Hepatic function panel - Magnesium - Lipid panel - TSH - Hemoglobin A1c - Insulin, random - VITAMIN D 25 Hydroxy        Patient was counseled in prudent diet, weight control to achieve/maintain BMI less than 25, BP monitoring, regular exercise and medications as discussed.  Discussed med effects and SE's. Routine screening labs and tests as requested with regular follow-up as recommended. Over 40 minutes of exam, counseling, chart review and high complex critical decision making was performed

## 2016-11-21 LAB — CBC WITH DIFFERENTIAL/PLATELET
BASOS ABS: 32 {cells}/uL (ref 0–200)
Basophils Relative: 0.5 %
EOS ABS: 109 {cells}/uL (ref 15–500)
Eosinophils Relative: 1.7 %
HEMATOCRIT: 41.9 % (ref 38.5–50.0)
Hemoglobin: 14.6 g/dL (ref 13.2–17.1)
LYMPHS ABS: 2234 {cells}/uL (ref 850–3900)
MCH: 33.2 pg — AB (ref 27.0–33.0)
MCHC: 34.8 g/dL (ref 32.0–36.0)
MCV: 95.2 fL (ref 80.0–100.0)
MPV: 8.9 fL (ref 7.5–12.5)
Monocytes Relative: 11.3 %
NEUTROS PCT: 51.6 %
Neutro Abs: 3302 cells/uL (ref 1500–7800)
Platelets: 304 10*3/uL (ref 140–400)
RBC: 4.4 10*6/uL (ref 4.20–5.80)
RDW: 12.3 % (ref 11.0–15.0)
Total Lymphocyte: 34.9 %
WBC: 6.4 10*3/uL (ref 3.8–10.8)
WBCMIX: 723 {cells}/uL (ref 200–950)

## 2016-11-21 LAB — LIPID PANEL
Cholesterol: 191 mg/dL (ref <200)
HDL: 47 mg/dL (ref >40)
LDL Cholesterol (Calc): 102 mg/dL (calc) — ABNORMAL HIGH
Non-HDL Cholesterol (Calc): 144 mg/dL (calc) — ABNORMAL HIGH (ref <130)
TRIGLYCERIDES: 292 mg/dL — AB (ref <150)
Total CHOL/HDL Ratio: 4.1 (calc) (ref <5.0)

## 2016-11-21 LAB — BASIC METABOLIC PANEL WITH GFR
BUN: 16 mg/dL (ref 7–25)
CALCIUM: 10 mg/dL (ref 8.6–10.3)
CO2: 24 mmol/L (ref 20–32)
Chloride: 103 mmol/L (ref 98–110)
Creat: 1.02 mg/dL (ref 0.70–1.33)
GFR, EST AFRICAN AMERICAN: 93 mL/min/{1.73_m2} (ref >=60)
GFR, EST NON AFRICAN AMERICAN: 81 mL/min/{1.73_m2} (ref >=60)
Glucose, Bld: 93 mg/dL (ref 65–99)
POTASSIUM: 4.1 mmol/L (ref 3.5–5.3)
SODIUM: 136 mmol/L (ref 135–146)

## 2016-11-21 LAB — TSH: TSH: 4.4 m[IU]/L (ref 0.40–4.50)

## 2016-11-21 LAB — URINALYSIS, ROUTINE W REFLEX MICROSCOPIC
BILIRUBIN URINE: NEGATIVE
Glucose, UA: NEGATIVE
Hgb urine dipstick: NEGATIVE
KETONES UR: NEGATIVE
Leukocytes, UA: NEGATIVE
NITRITE: NEGATIVE
PROTEIN: NEGATIVE
Specific Gravity, Urine: 1.009 (ref 1.001–1.03)
pH: 6 (ref 5.0–8.0)

## 2016-11-21 LAB — HEPATIC FUNCTION PANEL
AG Ratio: 1.9 (calc) (ref 1.0–2.5)
ALBUMIN MSPROF: 4.5 g/dL (ref 3.6–5.1)
ALT: 26 U/L (ref 9–46)
AST: 24 U/L (ref 10–35)
Alkaline phosphatase (APISO): 70 U/L (ref 40–115)
BILIRUBIN DIRECT: 0.1 mg/dL (ref 0.0–0.2)
BILIRUBIN TOTAL: 0.7 mg/dL (ref 0.2–1.2)
Globulin: 2.4 g/dL (calc) (ref 1.9–3.7)
Indirect Bilirubin: 0.6 mg/dL (calc) (ref 0.2–1.2)
Total Protein: 6.9 g/dL (ref 6.1–8.1)

## 2016-11-21 LAB — MICROALBUMIN / CREATININE URINE RATIO
CREATININE, URINE: 64 mg/dL (ref 20–320)
MICROALB UR: 0.3 mg/dL
Microalb Creat Ratio: 5 mcg/mg creat (ref <30)

## 2016-11-21 LAB — IRON, TOTAL/TOTAL IRON BINDING CAP
%SAT: 28 % (calc) (ref 15–60)
IRON: 118 ug/dL (ref 50–180)
TIBC: 422 mcg/dL (calc) (ref 250–425)

## 2016-11-21 LAB — HEMOGLOBIN A1C
EAG (MMOL/L): 5.8 (calc)
Hgb A1c MFr Bld: 5.3 % of total Hgb (ref <5.7)
Mean Plasma Glucose: 105 (calc)

## 2016-11-21 LAB — INSULIN, RANDOM: Insulin: 5.6 u[IU]/mL (ref 2.0–19.6)

## 2016-11-21 LAB — PSA: PSA: 0.3 ng/mL (ref <=4.0)

## 2016-11-21 LAB — TESTOSTERONE: Testosterone: 381 ng/dL (ref 250–827)

## 2016-11-21 LAB — VITAMIN B12: VITAMIN B 12: 305 pg/mL (ref 200–1100)

## 2016-11-21 LAB — MAGNESIUM: Magnesium: 2.2 mg/dL (ref 1.5–2.5)

## 2016-11-21 LAB — VITAMIN D 25 HYDROXY (VIT D DEFICIENCY, FRACTURES): Vit D, 25-Hydroxy: 44 ng/mL (ref 30–100)

## 2017-01-04 ENCOUNTER — Other Ambulatory Visit: Payer: Self-pay | Admitting: Internal Medicine

## 2017-01-31 ENCOUNTER — Other Ambulatory Visit: Payer: Self-pay | Admitting: Internal Medicine

## 2017-03-30 ENCOUNTER — Encounter: Payer: Self-pay | Admitting: Adult Health

## 2017-03-30 ENCOUNTER — Ambulatory Visit (INDEPENDENT_AMBULATORY_CARE_PROVIDER_SITE_OTHER): Payer: 59 | Admitting: Adult Health

## 2017-03-30 VITALS — BP 152/94 | HR 65 | Temp 98.1°F | Ht 70.5 in | Wt 190.0 lb

## 2017-03-30 DIAGNOSIS — G44019 Episodic cluster headache, not intractable: Secondary | ICD-10-CM

## 2017-03-30 MED ORDER — LIDOCAINE HCL 4 % EX SOLN: CUTANEOUS | 0 refills | Status: DC | PRN

## 2017-03-30 MED ORDER — RIZATRIPTAN BENZOATE 10 MG PO TABS: 10.0000 mg | ORAL_TABLET | ORAL | 0 refills | Status: DC | PRN

## 2017-03-30 MED ORDER — PREDNISONE 20 MG PO TABS: ORAL_TABLET | ORAL | 0 refills | Status: AC

## 2017-03-30 MED ORDER — VERAPAMIL HCL ER 360 MG PO CP24: 360.0000 mg | ORAL_CAPSULE | Freq: Every day | ORAL | 1 refills | Status: DC

## 2017-03-30 MED ORDER — VERAPAMIL HCL ER 240 MG PO TBCR: 240.0000 mg | EXTENDED_RELEASE_TABLET | Freq: Every day | ORAL | 1 refills | Status: DC

## 2017-03-30 NOTE — Progress Notes (Signed)
Assessment and Plan:  Keith Castro was seen today for headache.  Diagnoses and all orders for this visit:  Episodic cluster headache, not intractable Taper down on atenolol gradually to d/c -     verapamil (VERELAN PM) 240 mg ER tab - take one daily for headache prophylaxis -     rizatriptan (MAXALT) 10 MG tablet; Take 1 tablet (10 mg total) by mouth as needed for migraine. May repeat in 2 hours if needed -     predniSONE (DELTASONE) 20 MG tablet; 3 tablets daily with food for 3 days, 2 tabs daily for 3 days, 1 tab a day for 5 days. patient to go to ER if there is weakness, thunderclap headache, visual changes, or any concerning factors  Further disposition pending results of labs. Discussed med's effects and SE's.   Over 30 minutes of exam, counseling, chart review, and critical decision making was performed.   Future Appointments  Date Time Provider Department Center  12/21/2017  2:00 PM Lucky CowboyMcKeown, William, MD [id number] None    ------------------------------------------------------------------------------------------------------------------   HPI BP (!) 152/94   Pulse 65   Temp 98.1 F (36.7 C)   Ht 5' 10.5" (1.791 m)   Wt 190 lb (86.2 kg)   SpO2 98%   BMI 26.88 kg/m   57 y.o.male presents for L sided headache; "ice pick" behind his eye, jaw pain, cheek pain -10/10- nightly - ongoing for 4 weeks. He reports these began 3 weeks after his CPE when cluster headache prophylaxis medication was switched from verapamil 360 mg daily to atenolol 100 mg daily due to cost. He reports hx of episodic cluster headaches in 2012 which lasted 40+ days straight; he was evaluated by neurology at that time with unremarkable head CT.   Current episodes start 7-10 pm at night, will wake him up if already sleeping, lasts 30 min - couple hours, occasionally starts with an "aura" "fuzziness" to vision- he has been using old prescription for nasal sumatriptan but these have not been effective as previous.    Has been on topamax as prophylactic in the past but did not tolerate well - "got lost going to work."  Past Medical History:  Diagnosis Date  . Cluster headache   . Hyperlipidemia   . Hypertension   . Prediabetes   . Vitamin D deficiency      Allergies  Allergen Reactions  . Topamax [Topiramate]     Current Outpatient Medications on File Prior to Visit  Medication Sig  . acyclovir (ZOVIRAX) 200 MG capsule   . ALPRAZolam (XANAX) 0.5 MG tablet Take 1 tablet (0.5 mg total) by mouth at bedtime as needed for anxiety.  Marland Kitchen. aspirin 325 MG tablet Take 325 mg by mouth daily.  Marland Kitchen. atenolol (TENORMIN) 100 MG tablet Take 1 tablet daily for BP and Headache prophylaxis  . cetirizine (ZYRTEC ALLERGY) 10 MG tablet Take 10 mg by mouth daily.  . Cholecalciferol (VITAMIN D PO) Take 5,000 Int'l Units by mouth daily.   . Flaxseed, Linseed, (FLAXSEED OIL PO) Take by mouth daily.  . meloxicam (MOBIC) 15 MG tablet Take 1/2 to 1 tablet daily with food for pain & inflammation - limit to 5 days / week to avoid kidney damage  . Omega-3 Fatty Acids (FISH OIL PO) Take by mouth daily.  . Red Yeast Rice Extract (RED YEAST RICE PO) Take by mouth daily.  . sildenafil (REVATIO) 20 MG tablet TAKE 1 TO 5 TABLETS BY MOUTH DAILY AS NEEDED  . SUMAtriptan (IMITREX) 20  MG/ACT nasal spray Place 1 spray (20 mg total) into the nose every 2 (two) hours as needed (cluster headache needs 1 month).  Marland Kitchen ipratropium (ATROVENT) 0.03 % nasal spray Place 2 sprays into the nose 3 (three) times daily.  . sildenafil (VIAGRA) 100 MG tablet Take 1 tablet (100 mg total) by mouth as needed for erectile dysfunction.  . verapamil (VERELAN PM) 360 MG 24 hr capsule TAKE 1 CAPSULE BY MOUTH AT  BEDTIME (Patient not taking: Reported on 03/30/2017)   No current facility-administered medications on file prior to visit.     ROS: Review of Systems  Constitutional: Negative for chills, diaphoresis, fever, malaise/fatigue and weight loss.  HENT:  Negative for congestion, ear pain, hearing loss, nosebleeds, sinus pain and tinnitus.   Eyes: Negative for blurred vision, double vision and photophobia.  Respiratory: Negative for cough, sputum production, shortness of breath and wheezing.   Cardiovascular: Negative for chest pain and palpitations.  Gastrointestinal: Negative for abdominal pain, constipation, nausea and vomiting.  Musculoskeletal: Negative for back pain, falls, joint pain, myalgias and neck pain.  Skin: Negative for rash.  Neurological: Positive for headaches (L sided "ice pick" headaches nightly). Negative for dizziness, tingling, sensory change, speech change, focal weakness, seizures, loss of consciousness and weakness.  Psychiatric/Behavioral: Negative for memory loss.    Physical Exam:  BP (!) 152/94   Pulse 65   Temp 98.1 F (36.7 C)   Ht 5' 10.5" (1.791 m)   Wt 190 lb (86.2 kg)   SpO2 98%   BMI 26.88 kg/m   General Appearance: Well nourished, in no apparent distress. Eyes: PERRLA, EOMs, conjunctiva no swelling or erythema. Sinuses: No Frontal/maxillary tenderness ENT/Mouth: Ext aud canals clear, TMs without erythema, bulging. No erythema, swelling, or exudate on post pharynx.  Tonsils not swollen or erythematous. Hearing normal.  Neck: Supple, thyroid normal.  Respiratory: Respiratory effort normal, BS equal bilaterally without rales, rhonchi, wheezing or stridor.  Cardio: RRR with no MRGs. Brisk peripheral pulses without edema.  Abdomen: Soft, + BS.  Non tender. Lymphatics: Non tender without lymphadenopathy.  Musculoskeletal: Full ROM, 5/5 strength, normal gait.  Skin: Warm, dry without rashes, lesions, ecchymosis.  Neuro: Cranial nerves intact. Normal muscle tone, no cerebellar symptoms. Sensation intact.  Psych: Awake and oriented X 3, normal affect, Insight and Judgment appropriate.     Dan Maker, NP 4:18 PM [facility]

## 2017-03-30 NOTE — Patient Instructions (Signed)
Cluster Headache A cluster headache is a type of headache that causes deep, intense head pain. Cluster headaches can last from 15 minutes to 3 hours. They usually occur:  On one side of the head. They may occur on the other side when a new cluster of headaches begins.  Repeatedly over weeks to months.  Several times a day.  At the same time of day, often at night.  More often in the fall and springtime.  What are the causes? The cause of this condition is not known. What increases the risk? This condition is more likely to develop in:  Males.  People who drink alcohol.  People who smoke or use products that contain nicotine or tobacco.  People who take medicines that cause blood vessels to expand, such as nitroglycerin.  People who take antihistamines.  What are the signs or symptoms? Symptoms of this condition include:  Severe pain on one side of the head that begins behind or around your eye or temple.  Pain on one side of the head.  Nausea.  Sensitivity to light.  Runny nose and nasal stuffiness.  Sweaty, pale skin on the face.  Droopy or swollen eyelid, eye redness, or tearing.  Restlessness and agitation.  How is this diagnosed? This condition may be diagnosed based on:  Your symptoms.  A physical exam.  Your health care provider may order tests to see if your headaches are caused by another medical condition. These tests may show that you do not have cluster headaches. Tests may include:  A CT scan of your head.  An MRI of your head.  Lab tests.  How is this treated? This condition may be treated with:  Medicines to relieve pain and to prevent repeated (recurrent) attacks. Some people may need a combination of medicines.  Oxygen. This helps to relieve pain.  Follow these instructions at home: Headache diary Keep a headache diary as told by your health care provider. Doing this can help you and your health care provider figure out what  triggers your headaches. In your headache diary, include information about:  The time of day that your headache started and what you were doing when it began.  How long your headache lasted.  Where your pain started and whether it moved to other areas.  The type of pain, such as burning, stabbing, throbbing, or cramping.  Your level of pain. Use a pain scale and rate the pain with a number from 1 (mild) up to 10 (severe).  The treatment that you used, and any change in symptoms after treatment.  Medicines  Take over-the-counter and prescription medicines only as told by your health care provider.  Do not drive or use heavy machinery while taking prescription pain medicine.  Use oxygen as told by your health care provider. Lifestyle  Follow a regular sleep schedule. Do not vary the time that you go to bed or the amount that you sleep from day to day. It is important to stay on the same schedule during a cluster period to help prevent headaches.  Exercise regularly.  Eat a healthy diet and avoid foods that may trigger your headaches.  Avoid alcohol.  Do not use any products that contain nicotine or tobacco, such as cigarettes and e-cigarettes. If you need help quitting, ask your health care provider. Contact a health care provider if:  Your headaches change, become more severe, or occur more often.  The medicine or oxygen that your health care provider recommended does   not help. Get help right away if:  You faint.  You have weakness or numbness, especially on one side of your body or face.  You have double vision.  You have nausea or vomiting that does not go away within several hours.  You have trouble talking, walking, or keeping your balance.  You have pain or stiffness in your neck.  You have a fever. Summary  A cluster headache is a type of headache that causes deep, intense head pain, usually on one side of the head.  Keep a headache diary to help discover  what triggers your headaches.  A regular sleep schedule can help prevent headaches. This information is not intended to replace advice given to you by your health care provider. Make sure you discuss any questions you have with your health care provider. Document Released: 02/03/2005 Document Revised: 10/16/2015 Document Reviewed: 10/16/2015 Elsevier Interactive Patient Education  2018 [employer]  

## 2017-05-12 ENCOUNTER — Encounter: Payer: Self-pay | Admitting: Internal Medicine

## 2017-05-12 ENCOUNTER — Other Ambulatory Visit: Payer: Self-pay | Admitting: Internal Medicine

## 2017-05-12 DIAGNOSIS — G4700 Insomnia, unspecified: Secondary | ICD-10-CM

## 2017-05-12 MED ORDER — ALPRAZOLAM 0.5 MG PO TABS: ORAL_TABLET | ORAL | 0 refills | Status: DC

## 2017-05-22 ENCOUNTER — Encounter: Payer: Self-pay | Admitting: Adult Health

## 2017-05-24 ENCOUNTER — Other Ambulatory Visit: Payer: Self-pay | Admitting: Adult Health

## 2017-05-24 DIAGNOSIS — G44019 Episodic cluster headache, not intractable: Secondary | ICD-10-CM

## 2017-05-24 MED ORDER — SUMATRIPTAN 20 MG/ACT NA SOLN: 20.0000 mg | NASAL | 2 refills | Status: DC | PRN

## 2017-05-24 MED ORDER — RIZATRIPTAN BENZOATE 10 MG PO TABS: 10.0000 mg | ORAL_TABLET | ORAL | 0 refills | Status: DC | PRN

## 2017-05-26 MED ORDER — SUMATRIPTAN 20 MG/ACT NA SOLN: 20.0000 mg | NASAL | 2 refills | Status: DC | PRN

## 2017-05-26 MED ORDER — RIZATRIPTAN BENZOATE 10 MG PO TABS: 10.0000 mg | ORAL_TABLET | ORAL | 0 refills | Status: DC | PRN

## 2017-05-26 NOTE — Addendum Note (Signed)
Addended by: Dionicio StallUCKER, Tobechukwu Emmick on: 05/26/2017 04:34 PM   Modules accepted: Orders

## 2017-06-08 ENCOUNTER — Other Ambulatory Visit: Payer: Self-pay | Admitting: Physician Assistant

## 2017-06-08 DIAGNOSIS — G44019 Episodic cluster headache, not intractable: Secondary | ICD-10-CM

## 2017-06-08 MED ORDER — VERAPAMIL HCL ER 120 MG PO TBCR: 240.0000 mg | EXTENDED_RELEASE_TABLET | Freq: Every day | ORAL | 1 refills | Status: DC

## 2017-07-06 ENCOUNTER — Other Ambulatory Visit: Payer: Self-pay | Admitting: Internal Medicine

## 2017-08-29 ENCOUNTER — Other Ambulatory Visit: Payer: Self-pay | Admitting: Internal Medicine

## 2017-11-21 ENCOUNTER — Other Ambulatory Visit: Payer: Self-pay | Admitting: Internal Medicine

## 2017-11-21 DIAGNOSIS — G4700 Insomnia, unspecified: Secondary | ICD-10-CM

## 2017-11-21 MED ORDER — ALPRAZOLAM 0.5 MG PO TABS: ORAL_TABLET | ORAL | 0 refills | Status: DC

## 2017-12-03 ENCOUNTER — Other Ambulatory Visit: Payer: Self-pay | Admitting: Physician Assistant

## 2017-12-03 DIAGNOSIS — G44019 Episodic cluster headache, not intractable: Secondary | ICD-10-CM

## 2017-12-21 ENCOUNTER — Encounter: Payer: Self-pay | Admitting: Internal Medicine

## 2017-12-21 ENCOUNTER — Ambulatory Visit (INDEPENDENT_AMBULATORY_CARE_PROVIDER_SITE_OTHER): Payer: 59 | Admitting: Internal Medicine

## 2017-12-21 VITALS — BP 134/80 | HR 72 | Temp 97.7°F | Resp 16 | Ht 70.5 in | Wt 191.2 lb

## 2017-12-21 DIAGNOSIS — Z136 Encounter for screening for cardiovascular disorders: Secondary | ICD-10-CM | POA: Diagnosis not present

## 2017-12-21 DIAGNOSIS — F17200 Nicotine dependence, unspecified, uncomplicated: Secondary | ICD-10-CM

## 2017-12-21 DIAGNOSIS — Z125 Encounter for screening for malignant neoplasm of prostate: Secondary | ICD-10-CM | POA: Diagnosis not present

## 2017-12-21 DIAGNOSIS — E559 Vitamin D deficiency, unspecified: Secondary | ICD-10-CM

## 2017-12-21 DIAGNOSIS — E782 Mixed hyperlipidemia: Secondary | ICD-10-CM

## 2017-12-21 DIAGNOSIS — Z1211 Encounter for screening for malignant neoplasm of colon: Secondary | ICD-10-CM

## 2017-12-21 DIAGNOSIS — Z0000 Encounter for general adult medical examination without abnormal findings: Secondary | ICD-10-CM

## 2017-12-21 DIAGNOSIS — I10 Essential (primary) hypertension: Secondary | ICD-10-CM | POA: Diagnosis not present

## 2017-12-21 DIAGNOSIS — Z1212 Encounter for screening for malignant neoplasm of rectum: Secondary | ICD-10-CM

## 2017-12-21 DIAGNOSIS — Z111 Encounter for screening for respiratory tuberculosis: Secondary | ICD-10-CM

## 2017-12-21 DIAGNOSIS — R7303 Prediabetes: Secondary | ICD-10-CM

## 2017-12-21 DIAGNOSIS — Z0001 Encounter for general adult medical examination with abnormal findings: Secondary | ICD-10-CM

## 2017-12-21 DIAGNOSIS — R5383 Other fatigue: Secondary | ICD-10-CM

## 2017-12-21 DIAGNOSIS — Z8249 Family history of ischemic heart disease and other diseases of the circulatory system: Secondary | ICD-10-CM

## 2017-12-21 DIAGNOSIS — Z79899 Other long term (current) drug therapy: Secondary | ICD-10-CM

## 2017-12-21 NOTE — Patient Instructions (Signed)

## 2017-12-21 NOTE — Progress Notes (Signed)
[facility]   Lucky Cowboy, M.D.     Dyanne Carrel. Steffanie Dunn, P.A.-C Judd Gaudier, DNP [facility]                [address]                [place], [place]. [address] Telephone [phone] Telefax [phone] Annual  Screening/Preventative Visit  & Comprehensive Evaluation & Examination     This very nice 57 y.o. MWM presents for a Screening /Preventative Visit & comprehensive evaluation and management of multiple medical co-morbidities.  Patient has been followed for HTN, HLD, Prediabetes and Vitamin D Deficiency.     HTN predates circa 2004. Patient's BP has been controlled at home.  Today's BP is at goal -  134/80. Patient denies any cardiac symptoms as chest pain, palpitations, shortness of breath, dizziness or ankle swelling.     Patient's hyperlipidemia is not controlled with diet. Current lipids are not at goal: Lab Results  Component Value Date   CHOL 201 (H) 12/21/2017   HDL 63 12/21/2017   LDLCALC 117 (H) 12/21/2017   TRIG 99 12/21/2017   CHOLHDL 3.2 12/21/2017      Patient has hx/o prediabetes  (A1c 5.7% in 2011)  and patient denies reactive hypoglycemic symptoms, visual blurring, diabetic polys or paresthesias. Current A1c is Normal & at goal: Lab Results  Component Value Date   HGBA1C 5.3 12/21/2017       Finally, patient has history of Vitamin D Deficiency ("33" / 2011)  And current vitamin D is at goal: Lab Results  Component Value Date   VD25OH 67 12/21/2017   Current Outpatient Medications on File Prior to Visit  Medication Sig  . acyclovir (ZOVIRAX) 200 MG capsule   . ALPRAZolam (XANAX) 0.5 MG tablet Take 1/2 to 1 tablet if needed at hour of sleep  . aspirin 325 MG tablet Take 325 mg by mouth daily.  . cetirizine (ZYRTEC ALLERGY) 10 MG tablet Take 10 mg by mouth daily.  . Cholecalciferol (VITAMIN D PO) Take 5,000 Int'l Units by mouth daily.   . Flaxseed, Linseed, (FLAXSEED OIL PO) Take  by mouth daily.  Marland Kitchen lidocaine (XYLOCAINE) 4 % external solution Apply topically as needed. Apply intranasally every 3 hours as needed for headache - may repeat 3 hours later as needed.  . Omega-3 Fatty Acids (FISH OIL PO) Take by mouth daily.  Marland Kitchen OVER THE COUNTER MEDICATION Takes CBD gummy bears for pain  . Red Yeast Rice Extract (RED YEAST RICE PO) Take by mouth daily.  . rizatriptan (MAXALT) 10 MG tablet Take 1 tablet (10 mg total) by mouth as needed for migraine. May repeat in 2 hours if needed  . SUMAtriptan (IMITREX) 20 MG/ACT nasal spray Place 1 spray (20 mg total) into the nose every 2 (two) hours as needed (cluster headache needs 1 month).  . verapamil (CALAN-SR) 120 MG CR tablet TAKE 2 TABLETS BY MOUTH EVERY DAY  . sildenafil (VIAGRA) 100 MG tablet Take 1 tablet (100 mg total) by mouth as needed for erectile dysfunction.   No current facility-administered medications on file prior to visit.    Allergies  Allergen Reactions  . Topamax [Topiramate]    Past Medical History:  Diagnosis Date  . Cluster headache   . Hyperlipidemia   . Hypertension   . Prediabetes   . Vitamin D deficiency    Health Maintenance  Topic Date Due  . COLONOSCOPY  04/24/2008  . TETANUS/TDAP  04/09/2018  . INFLUENZA VACCINE  Completed  . Hepatitis C Screening  Completed  . HIV Screening  Completed   Immunization History  Administered Date(s) Administered  . Influenza-Unspecified 11/18/2016, 11/25/2017  . PPD Test 09/26/2013, 09/27/2014, 10/18/2015, 11/20/2016, 12/21/2017  . Pneumococcal-Unspecified 07/24/2008  . Td 04/14/2006, 04/09/2008   Last Colon - 2010 at [facility] - f/u due 2020.  Past Surgical History:  Procedure Laterality Date  . APPENDECTOMY  1972  . INCISE AND DRAIN ABCESS  2008    right shoulder  . PLEURAL SCARIFICATION Left 1979   chest tube thoracostomy   Family History  Problem Relation Age of Onset  . Stroke Father    Social History   Socioeconomic  History  . Marital status: Married    Spouse name: Not on file  . Number of children: Not on file  Occupational History  . Not on file  Tobacco Use  . Smoking status: Current Every Day Smoker  . Smokeless tobacco: Never Used  . Tobacco comment: smokes 5-6 cigarettes daily  Substance and Sexual Activity  . Alcohol use: Yes    Alcohol/week: 0.0 standard drinks    Comment: occ beer  . Drug use: No  . Sexual activity: Not on file  Lifestyle  . Physical activity:    Days per week: Not on file    Minutes per session: Not on file  . Stress: Not on file    ROS Constitutional: Denies fever, chills, weight loss/gain, headaches, insomnia,  night sweats or change in appetite. Does c/o fatigue. Eyes: Denies redness, blurred vision, diplopia, discharge, itchy or watery eyes.  ENT: Denies discharge, congestion, post nasal drip, epistaxis, sore throat, earache, hearing loss, dental pain, Tinnitus, Vertigo, Sinus pain or snoring.  Cardio: Denies chest pain, palpitations, irregular heartbeat, syncope, dyspnea, diaphoresis, orthopnea, PND, claudication or edema Respiratory: denies cough, dyspnea, DOE, pleurisy, hoarseness, laryngitis or wheezing.  Gastrointestinal: Denies dysphagia, heartburn, reflux, water brash, pain, cramps, nausea, vomiting, bloating, diarrhea, constipation, hematemesis, melena, hematochezia, jaundice or hemorrhoids Genitourinary: Denies dysuria, frequency, urgency, nocturia, hesitancy, discharge, hematuria or flank pain Musculoskeletal: Denies arthralgia, myalgia, stiffness, Jt. Swelling, pain, limp or strain/sprain. Denies Falls. Skin: Denies puritis, rash, hives, warts, acne, eczema or change in skin lesion Neuro: No weakness, tremor, incoordination, spasms, paresthesia or pain Psychiatric: Denies confusion, memory loss or sensory loss. Denies Depression. Endocrine: Denies change in weight, skin, hair change, nocturia, and paresthesia, diabetic polys, visual blurring or hyper /  hypo glycemic episodes.  Heme/Lymph: No excessive bleeding, bruising or enlarged lymph nodes.  Physical Exam  BP 134/80   Pulse 72   Temp 97.7 F (36.5 C)   Resp 16   Ht 5' 10.5" (1.791 m)   Wt 191 lb 3.2 oz (86.7 kg)   BMI 27.05 kg/m   General Appearance: Well nourished and well groomed and in no apparent distress.  Eyes: PERRLA, EOMs, conjunctiva no swelling or erythema, normal fundi and vessels. Sinuses: No frontal/maxillary tenderness ENT/Mouth: EACs patent / TMs  nl. Nares clear without erythema, swelling, mucoid exudates. Oral hygiene is good. No erythema, swelling, or exudate. Tongue normal, non-obstructing. Tonsils not swollen or erythematous. Hearing normal.  Neck: Supple, thyroid not palpable. No bruits, nodes or JVD. Respiratory: Respiratory effort normal.  BS equal and clear bilateral without rales, rhonci, wheezing or stridor. Cardio: Heart sounds are normal with regular rate and rhythm and no murmurs, rubs or gallops. Peripheral pulses are normal and equal bilaterally without edema. No  aortic or femoral bruits. Chest: symmetric with normal excursions and percussion.  Abdomen: Soft, with Nl bowel sounds. Nontender, no guarding, rebound, hernias, masses, or organomegaly.  Lymphatics: Non tender without lymphadenopathy.  Genitourinary: No hernias.Testes nl. DRE - prostate nl for age - smooth & firm w/o nodules. Musculoskeletal: Full ROM all peripheral extremities, joint stability, 5/5 strength, and normal gait. Skin: Warm and dry without rashes, lesions, cyanosis, clubbing or  ecchymosis.  Neuro: Cranial nerves intact, reflexes equal bilaterally. Normal muscle tone, no cerebellar symptoms. Sensation intact.  Pysch: Alert and oriented X 3 with normal affect, insight and judgment appropriate.   Assessment and Plan  1. Annual Preventative/Screening Exam   2. Essential hypertension  - EKG 12-Lead - [place], RETROPERITNL ABD,  LTD - Urinalysis, Routine w reflex microscopic -  Microalbumin / creatinine urine ratio - CBC with Differential/Platelet - COMPLETE METABOLIC PANEL WITH GFR - Magnesium - TSH  3. Hyperlipidemia, mixed  - EKG 12-Lead - [place], RETROPERITNL ABD,  LTD - Lipid panel - TSH  4. Prediabetes  - EKG 12-Lead - [place], RETROPERITNL ABD,  LTD - Hemoglobin A1c - Insulin, random  5. Vitamin D deficiency  - VITAMIN D 25 Hydroxyl  6. Screening for colorectal cancer  - POC Hemoccult Bld/Stl  7. Prostate cancer screening  - PSA  8. Screening for ischemic heart disease  - EKG 12-Lead  9. Family history of cerebrovascular event  - EKG 12-Lead - [place], RETROPERITNL ABD,  LTD  10. Smoker  - EKG 12-Lead - [place], RETROPERITNL ABD,  LTD  11. Screening for AAA (aortic abdominal aneurysm)  - [place], RETROPERITNL ABD,  LTD  12. Screening examination for pulmonary tuberculosis   13. Fatigue, unspecified type  - Iron,Total/Total Iron Binding Cap - Vitamin B12 - Testosterone - CBC with Differential/Platelet - TSH  14. Medication management  - Urinalysis, Routine w reflex microscopic - Microalbumin / creatinine urine ratio - CBC with Differential/Platelet - COMPLETE METABOLIC PANEL WITH GFR - Magnesium - Lipid panel - TSH - Hemoglobin A1c - Insulin, random - VITAMIN D 25 Hydroxyl       Patient was counseled in prudent diet, weight control to achieve/maintain BMI less than 25, BP monitoring, regular exercise and medications as discussed.  Discussed med effects and SE's. Routine screening labs and tests as requested with regular follow-up as recommended. Over 40 minutes of exam, counseling, chart review and high complex critical decision making was performed

## 2017-12-22 LAB — COMPLETE METABOLIC PANEL WITH GFR
AG Ratio: 2 (calc) (ref 1.0–2.5)
ALKALINE PHOSPHATASE (APISO): 70 U/L (ref 40–115)
ALT: 26 U/L (ref 9–46)
AST: 24 U/L (ref 10–35)
Albumin: 4.8 g/dL (ref 3.6–5.1)
BILIRUBIN TOTAL: 0.9 mg/dL (ref 0.2–1.2)
BUN: 9 mg/dL (ref 7–25)
CHLORIDE: 103 mmol/L (ref 98–110)
CO2: 25 mmol/L (ref 20–32)
Calcium: 10.2 mg/dL (ref 8.6–10.3)
Creat: 0.77 mg/dL (ref 0.70–1.33)
GFR, Est African American: 115 mL/min/{1.73_m2} (ref >=60)
GFR, Est Non African American: 99 mL/min/{1.73_m2} (ref >=60)
GLUCOSE: 87 mg/dL (ref 65–99)
Globulin: 2.4 g/dL (calc) (ref 1.9–3.7)
Potassium: 4.3 mmol/L (ref 3.5–5.3)
Sodium: 138 mmol/L (ref 135–146)
Total Protein: 7.2 g/dL (ref 6.1–8.1)

## 2017-12-22 LAB — CBC WITH DIFFERENTIAL/PLATELET
Basophils Absolute: 43 cells/uL (ref 0–200)
Basophils Relative: 0.6 %
EOS PCT: 0.6 %
Eosinophils Absolute: 43 cells/uL (ref 15–500)
HCT: 45.9 % (ref 38.5–50.0)
Hemoglobin: 16.2 g/dL (ref 13.2–17.1)
LYMPHS ABS: 1960 {cells}/uL (ref 850–3900)
MCH: 33.8 pg — ABNORMAL HIGH (ref 27.0–33.0)
MCHC: 35.3 g/dL (ref 32.0–36.0)
MCV: 95.6 fL (ref 80.0–100.0)
MONOS PCT: 10.9 %
MPV: 9.2 fL (ref 7.5–12.5)
Neutro Abs: 4281 cells/uL (ref 1500–7800)
Neutrophils Relative %: 60.3 %
PLATELETS: 293 10*3/uL (ref 140–400)
RBC: 4.8 10*6/uL (ref 4.20–5.80)
RDW: 12.1 % (ref 11.0–15.0)
TOTAL LYMPHOCYTE: 27.6 %
WBC mixed population: 774 cells/uL (ref 200–950)
WBC: 7.1 10*3/uL (ref 3.8–10.8)

## 2017-12-22 LAB — URINALYSIS, ROUTINE W REFLEX MICROSCOPIC
Bilirubin Urine: NEGATIVE
Glucose, UA: NEGATIVE
Hgb urine dipstick: NEGATIVE
KETONES UR: NEGATIVE
Leukocytes, UA: NEGATIVE
NITRITE: NEGATIVE
PH: 6.5 (ref 5.0–8.0)
Protein, ur: NEGATIVE
Specific Gravity, Urine: 1.008 (ref 1.001–1.03)

## 2017-12-22 LAB — LIPID PANEL
Cholesterol: 201 mg/dL — ABNORMAL HIGH (ref <200)
HDL: 63 mg/dL (ref >40)
LDL Cholesterol (Calc): 117 mg/dL (calc) — ABNORMAL HIGH
Non-HDL Cholesterol (Calc): 138 mg/dL (calc) — ABNORMAL HIGH (ref <130)
TRIGLYCERIDES: 99 mg/dL (ref <150)
Total CHOL/HDL Ratio: 3.2 (calc) (ref <5.0)

## 2017-12-22 LAB — HEMOGLOBIN A1C
EAG (MMOL/L): 5.8 (calc)
Hgb A1c MFr Bld: 5.3 % of total Hgb (ref <5.7)
Mean Plasma Glucose: 105 (calc)

## 2017-12-22 LAB — IRON, TOTAL/TOTAL IRON BINDING CAP
%SAT: 31 % (ref 20–48)
Iron: 134 ug/dL (ref 50–180)
TIBC: 437 mcg/dL (calc) — ABNORMAL HIGH (ref 250–425)

## 2017-12-22 LAB — TSH: TSH: 4.52 m[IU]/L — AB (ref 0.40–4.50)

## 2017-12-22 LAB — VITAMIN D 25 HYDROXY (VIT D DEFICIENCY, FRACTURES): Vit D, 25-Hydroxy: 67 ng/mL (ref 30–100)

## 2017-12-22 LAB — TESTOSTERONE: TESTOSTERONE: 469 ng/dL (ref 250–827)

## 2017-12-22 LAB — MICROALBUMIN / CREATININE URINE RATIO: Creatinine, Urine: 56 mg/dL (ref 20–320)

## 2017-12-22 LAB — INSULIN, RANDOM: INSULIN: 2.3 u[IU]/mL (ref 2.0–19.6)

## 2017-12-22 LAB — MAGNESIUM: MAGNESIUM: 2.2 mg/dL (ref 1.5–2.5)

## 2017-12-22 LAB — VITAMIN B12: VITAMIN B 12: 483 pg/mL (ref 200–1100)

## 2017-12-22 LAB — PSA: PSA: 0.3 ng/mL (ref <=4.0)

## 2017-12-27 ENCOUNTER — Encounter: Payer: Self-pay | Admitting: Internal Medicine

## 2018-03-13 ENCOUNTER — Other Ambulatory Visit: Payer: Self-pay | Admitting: Internal Medicine

## 2018-05-25 ENCOUNTER — Other Ambulatory Visit: Payer: Self-pay | Admitting: Internal Medicine

## 2018-05-25 MED ORDER — MELOXICAM 15 MG PO TABS: ORAL_TABLET | ORAL | 1 refills | Status: DC

## 2018-06-20 ENCOUNTER — Other Ambulatory Visit: Payer: Self-pay | Admitting: Internal Medicine

## 2018-06-20 ENCOUNTER — Other Ambulatory Visit: Payer: Self-pay | Admitting: Adult Health

## 2018-06-20 DIAGNOSIS — G44019 Episodic cluster headache, not intractable: Secondary | ICD-10-CM

## 2018-06-20 MED ORDER — VERAPAMIL HCL ER 240 MG PO TBCR: EXTENDED_RELEASE_TABLET | ORAL | 1 refills | Status: DC

## 2018-06-22 ENCOUNTER — Ambulatory Visit: Payer: Self-pay | Admitting: Adult Health Nurse Practitioner

## 2018-06-22 ENCOUNTER — Ambulatory Visit: Payer: Self-pay | Admitting: Adult Health

## 2018-07-05 NOTE — Progress Notes (Signed)
6 Month Follow Up   Assessment and Plan:   Nacari was seen today for follow-up.  Diagnoses and all orders for this visit:  Essential hypertension Continue current regiment Monitor blood pressure at home; patient to call if consistently greater than 140/90 Continue DASH diet.   Reminder to go to the ER if any CP, SOB, nausea, dizziness, severe HA, changes vision/speech, left arm numbness and tingling and jaw pain -     CBC with Differential/Platelet -     COMPLETE METABOLIC PANEL WITH GFR -     Magnesium  Hyperlipidemia, mixed Continue Red yeast rice Discussed dietary and exercise modifications -     Lipid panel  Prediabetes Discussed dietary and exercise modifications  Vitamin D deficiency Continue supplementation -     VITAMIN D 25 Hydroxy (Vit-D Deficiency)  Fatigue, unspecified type -     VITAMIN D 25 Hydroxy (Vit-D Deficiency)  Insomnia, unspecified type Doing well on current regiment Alprazolam 1/2 tab prn good sleep hygiene discussed  increase day time activity   Erectile dysfunction, unspecified erectile dysfunction type Doing well Using sildenafil 100mg  PRN  BMI 27.0-27.9,adult Discussed dietary and exercise modifications -     Hemoglobin A1c -     Insulin, random  Need for tetanus, diphtheria, and acellular pertussis (Tdap) vaccine -     Tdap vaccine greater than or equal to 7yo IM Received today  Medication management -     CBC with Differential/Platelet -     COMPLETE METABOLIC PANEL WITH GFR -     Magnesium -     Lipid panel -     TSH -     Hemoglobin A1c -     Insulin, random -     VITAMIN D 25 Hydroxy (Vit-D Deficiency, Fractures)    Continue diet and meds as discussed. Further disposition pending results of labs. Discussed med's effects and SE's.   Over 30 minutes of exam, counseling, chart review, and critical decision making was performed.   Future Appointments  Date Time Provider Department Center  01/10/2019  2:00 PM Lucky Cowboy, MD [id number] None    ----------------------------------------------------------------------------------------------------------------------  HPI 58 y.o. male  presents for 3 month follow up on HTN, HLD, history of pre-diabetes,insomnia, migraines, ED, weight and vitamin D deficiency.   BMI is Body mass index is 27.41 kg/m., he has been working on diet and exercise. Wt Readings from Last 3 Encounters:  07/06/18 193 lb 12.8 oz (87.9 kg)  12/21/17 191 lb 3.2 oz (86.7 kg)  03/30/17 190 lb (86.2 kg)    HTN predates 2004 His blood pressure has been controlled at home, today their BP is BP: 132/84  He does not workout. He denies any cardiac symptoms, chest pains, palpitations, shortness of breath, dizziness or lower extremity edema.     He is on cholesterol medication omega 3 and Red Yeast Rice Supplement and denies myalgias. His cholesterol is not at goal. The cholesterol last visit was:   Lab Results  Component Value Date   CHOL 181 07/06/2018   HDL 51 07/06/2018   LDLCALC 102 (H) 07/06/2018   TRIG 160 (H) 07/06/2018   CHOLHDL 3.5 07/06/2018    He has not been working on diet and exercise for prediabetes (A1c 5.7%, 2011), and denies foot ulcerations, paresthesia of the feet, polydipsia, polyuria and visual disturbances. Last A1C in the office was:  Lab Results  Component Value Date   HGBA1C 5.5 07/06/2018   Patient is on Vitamin D supplement.  Lab Results  Component Value Date   VD25OH 56 07/06/2018       Current Medications:  Current Outpatient Medications on File Prior to Visit  Medication Sig  . acyclovir (ZOVIRAX) 200 MG capsule as needed.   . ALPRAZolam (XANAX) 0.5 MG tablet Take 1/2 to 1 tablet if needed at hour of sleep  . aspirin 81 MG tablet Take 81 mg by mouth daily.   . cetirizine (ZYRTEC ALLERGY) 10 MG tablet Take 10 mg by mouth daily.  . Cholecalciferol (VITAMIN D PO) Take 5,000 Int'l Units by mouth daily.   . Flaxseed, Linseed, (FLAXSEED OIL PO)  Take by mouth daily.  Marland Kitchen lidocaine (XYLOCAINE) 4 % external solution Apply topically as needed. Apply intranasally every 3 hours as needed for headache - may repeat 3 hours later as needed.  . meloxicam (MOBIC) 15 MG tablet Take 1/2 to 1 tablet daily with food for Pain & Inflammation - Please limit to 5 tabs /week to Avoid Kidney Damage  . Omega-3 Fatty Acids (FISH OIL PO) Take by mouth daily.  . Red Yeast Rice Extract (RED YEAST RICE PO) Take by mouth daily.  . rizatriptan (MAXALT) 10 MG tablet Take 1 tablet (10 mg total) by mouth as needed for migraine. May repeat in 2 hours if needed  . sildenafil (VIAGRA) 100 MG tablet TAKE HALF A TABLET TO ONE TABLET DAILY AS NEEDED  . SUMAtriptan (IMITREX) 20 MG/ACT nasal spray Place 1 spray (20 mg total) into the nose every 2 (two) hours as needed (cluster headache needs 1 month).  . verapamil (CALAN-SR) 240 MG CR tablet Take 1 tablet Daily with food for BP & Cluster Headache Prevention   No current facility-administered medications on file prior to visit.     Allergies:  Allergies  Allergen Reactions  . Topamax [Topiramate]      Medical History:  Past Medical History:  Diagnosis Date  . Cluster headache   . Hyperlipidemia   . Hypertension   . Prediabetes   . Vitamin D deficiency     Family history- Reviewed and unchanged   Social history- Reviewed and unchanged   Names of Other Physician/Practitioners you currently use: 1. [facility] here for primary care 2. Eye Exam: Due 3. Dental Exam Due  Patient Care Team: Lucky Cowboy, MD as PCP - General (Internal Medicine)   Screening Tests: Immunization History  Administered Date(s) Administered  . Influenza-Unspecified 11/18/2016, 11/25/2017  . PPD Test 09/26/2013, 09/27/2014, 10/18/2015, 11/20/2016, 12/21/2017  . Pneumococcal-Unspecified 07/24/2008  . Td 04/14/2006, 04/09/2008  . Tdap 07/06/2018     Vaccinations: TD or Tdap: 2010, DUE  received today 07/06/18  Influenza: 2019 Pneumococcal: 2010 Prevnar13: Due at 58 years of age Shingles/Zostavax: Shingrix   Preventative Care: Last colonoscopy: 2010, Due DEXA: N/A increased rick as smoker  Imaging: Chest X-ray: EKG: ECHO:    Review of Systems:  Review of Systems  Constitutional: Negative for chills, diaphoresis, fever, malaise/fatigue and weight loss.  HENT: Negative for congestion, ear discharge, ear pain, hearing loss, nosebleeds, sinus pain, sore throat and tinnitus.   Eyes: Negative for blurred vision, double vision, photophobia, pain, discharge and redness.  Respiratory: Negative for cough, hemoptysis, sputum production, shortness of breath, wheezing and stridor.   Cardiovascular: Negative for chest pain, palpitations, orthopnea, claudication, leg swelling and PND.  Gastrointestinal: Negative for abdominal pain, blood in stool, constipation, diarrhea, heartburn, melena, nausea and vomiting.  Genitourinary: Negative for dysuria, flank pain, frequency, hematuria and urgency.  Musculoskeletal: Negative for back pain, falls, joint pain, myalgias and neck pain.  Skin: Negative for itching and rash.  Neurological: Negative for dizziness, tingling, tremors, sensory change, speech change, focal weakness, seizures, loss of consciousness, weakness and headaches.  Endo/Heme/Allergies: Negative for environmental allergies and polydipsia. Does not bruise/bleed easily.  Psychiatric/Behavioral: Negative for depression, hallucinations, memory loss, substance abuse and suicidal ideas. The patient is not nervous/anxious and does not have insomnia.       Physical Exam: BP 132/84   Pulse 65   Temp 97.9 F (36.6 C)   Ht 5' 10.5" (1.791 m)   Wt 193 lb 12.8 oz (87.9 kg)   SpO2 97%   BMI 27.41 kg/m  Wt Readings from Last 3 Encounters:  07/06/18 193 lb 12.8 oz (87.9 kg)  12/21/17 191 lb 3.2 oz (86.7 kg)  03/30/17 190 lb (86.2 kg)   General Appearance: Well nourished,  in no apparent distress. Eyes: PERRLA, EOMs, conjunctiva no swelling or erythema Sinuses: No Frontal/maxillary tenderness ENT/Mouth: Ext aud canals clear, TMs without erythema, bulging. No erythema, swelling, or exudate on post pharynx.  Tonsils not swollen or erythematous. Hearing normal.  Neck: Supple, thyroid normal.  Respiratory: Respiratory effort normal, BS equal bilaterally without rales, rhonchi, wheezing or stridor.  Cardio: RRR with no MRGs. Brisk peripheral pulses without edema.  Abdomen: Soft, + BS.  Non tender, no guarding, rebound, hernias, masses. Lymphatics: Non tender without lymphadenopathy.  Musculoskeletal: Full ROM, 5/5 strength, normal gait Skin: Warm, dry without rashes, lesions, ecchymosis.  Neuro: Cranial nerves intact. No cerebellar symptoms.  Psych: Awake and oriented X 3, normal affect, Insight and Judgment appropriate.    Elder NegusKyra Luiza Carranco, NP [facility] 14:30 PM

## 2018-07-06 ENCOUNTER — Ambulatory Visit (INDEPENDENT_AMBULATORY_CARE_PROVIDER_SITE_OTHER): Payer: 59 | Admitting: Adult Health Nurse Practitioner

## 2018-07-06 ENCOUNTER — Ambulatory Visit: Payer: Self-pay | Admitting: Adult Health Nurse Practitioner

## 2018-07-06 ENCOUNTER — Other Ambulatory Visit: Payer: Self-pay

## 2018-07-06 VITALS — BP 132/84 | HR 65 | Temp 97.9°F | Ht 70.5 in | Wt 193.8 lb

## 2018-07-06 DIAGNOSIS — R7303 Prediabetes: Secondary | ICD-10-CM | POA: Diagnosis not present

## 2018-07-06 DIAGNOSIS — I10 Essential (primary) hypertension: Secondary | ICD-10-CM | POA: Diagnosis not present

## 2018-07-06 DIAGNOSIS — E559 Vitamin D deficiency, unspecified: Secondary | ICD-10-CM | POA: Diagnosis not present

## 2018-07-06 DIAGNOSIS — Z23 Encounter for immunization: Secondary | ICD-10-CM | POA: Diagnosis not present

## 2018-07-06 DIAGNOSIS — Z79899 Other long term (current) drug therapy: Secondary | ICD-10-CM | POA: Diagnosis not present

## 2018-07-06 DIAGNOSIS — N529 Male erectile dysfunction, unspecified: Secondary | ICD-10-CM

## 2018-07-06 DIAGNOSIS — E782 Mixed hyperlipidemia: Secondary | ICD-10-CM | POA: Diagnosis not present

## 2018-07-06 DIAGNOSIS — G4700 Insomnia, unspecified: Secondary | ICD-10-CM

## 2018-07-06 DIAGNOSIS — Z6827 Body mass index (BMI) 27.0-27.9, adult: Secondary | ICD-10-CM

## 2018-07-06 DIAGNOSIS — R5383 Other fatigue: Secondary | ICD-10-CM

## 2018-07-06 NOTE — Patient Instructions (Addendum)
We will contact you via MyChart with your lab results 1-3days.  You are due for the following after the restrictions for COVID-19 are lifted  Colonoscopy - let us know if you need a referral  Eye Exam Dental exam  Today you receive the Tdap vaccination  Shingrix vaccination you may get this at [facility] or [facility], call to schedule this and for availability. Below is general information about the vaccine.   Ask insurance and pharmacy about shingrix - new vaccine   Can go to [email or web address] for more information  Shingrix Vaccination  Two vaccines are licensed and recommended to prevent shingles in the [place] Zoster vaccine live (ZVL, Zostavax) has been in use since 2006. Recombinant zoster vaccine (RZV, Shingrix), has been in use since 2017 and is recommended by ACIP as the preferred shingles vaccine.  What Everyone Should Know about Shingles Vaccine (Shingrix) One of the Recommended Vaccines by Disease Shingles vaccination is the only way to protect against shingles and postherpetic neuralgia (PHN), the most common complication from shingles. CDC recommends that healthy adults 50 years and older get two doses of the shingles vaccine called Shingrix (recombinant zoster vaccine), separated by 2 to 6 months, to prevent shingles and the complications from the disease. Your doctor or pharmacist can give you Shingrix as a shot in your upper arm. Shingrix provides strong protection against shingles and PHN. Two doses of Shingrix is more than 90% effective at preventing shingles and PHN. Protection stays above 85% for at least the first four years after you get vaccinated. Shingrix is the preferred vaccine, over Zostavax (zoster vaccine live), a shingles vaccine in use since 2006. Zostavax may still be used to prevent shingles in healthy adults 60 years and older. For example, you could use Zostavax if a person is allergic to Shingrix, prefers  Zostavax, or requests immediate vaccination and Shingrix is unavailable. Who Should Get Shingrix? Healthy adults 50 years and older should get two doses of Shingrix, separated by 2 to 6 months. You should get Shingrix even if in the past you . had shingles  . received Zostavax  . are not sure if you had chickenpox There is no maximum age for getting Shingrix. If you had shingles in the past, you can get Shingrix to help prevent future occurrences of the disease. There is no specific length of time that you need to wait after having shingles before you can receive Shingrix, but generally you should make sure the shingles rash has gone away before getting vaccinated. You can get Shingrix whether or not you remember having had chickenpox in the past. Studies show that more than 99% of Americans 40 years and older have had chickenpox, even if they don't remember having the disease. Chickenpox and shingles are related because they are caused by the same virus (varicella zoster virus). After a person recovers from chickenpox, the virus stays dormant (inactive) in the body. It can reactivate years later and cause shingles. If you had Zostavax in the recent past, you should wait at least eight weeks before getting Shingrix. Talk to your healthcare provider to determine the best time to get Shingrix. Shingrix is available in [employer] and pharmacies. To find doctor's offices or pharmacies near you that offer the vaccine, visit HealthMap Vaccine FinderExternal. If you have questions about Shingrix, talk with your healthcare provider. Vaccine for Those 50 Years and Older  Shingrix reduces the risk of shingles and PHN by more than 90% in people 15 and older. CDC recommends  the vaccine for healthy adults 50 and older.  Who Should Not Get Shingrix? You should not get Shingrix if you: . have ever had a severe allergic reaction to any component of the vaccine or after a dose of Shingrix  . tested negative  for immunity to varicella zoster virus. If you test negative, you should get chickenpox vaccine.  . currently have shingles  . currently are pregnant or breastfeeding. Women who are pregnant or breastfeeding should wait to get Shingrix.  Marland Kitchen. receive specific antiviral drugs (acyclovir, famciclovir, or valacyclovir) 24 hours before vaccination (avoid use of these antiviral drugs for 14 days after vaccination)- zoster vaccine live only If you have a minor acute (starts suddenly) illness, such as a cold, you may get Shingrix. But if you have a moderate or severe acute illness, you should usually wait until you recover before getting the vaccine. This includes anyone with a temperature of 101.68F or higher. The side effects of the Shingrix are temporary, and usually last 2 to 3 days. While you may experience pain for a few days after getting Shingrix, the pain will be less severe than having shingles and the complications from the disease. How Well Does Shingrix Work? Two doses of Shingrix provides strong protection against shingles and postherpetic neuralgia (PHN), the most common complication of shingles. . In adults 5050 to 60 years old who got two doses, Shingrix was 97% effective in preventing shingles; among adults 70 years and older, Shingrix was 91% effective.  . In adults 3550 to 60 years old who got two doses, Shingrix was 91% effective in preventing PHN; among adults 70 years and older, Shingrix was 89% effective. Shingrix protection remained high (more than 85%) in people 70 years and older throughout the four years following vaccination. Since your risk of shingles and PHN increases as you get older, it is important to have strong protection against shingles in your older years. Top of Page  What Are the Possible Side Effects of Shingrix? Studies show that Shingrix is safe. The vaccine helps your body create a strong defense against shingles. As a result, you are likely to have temporary side effects  from getting the shots. The side effects may affect your ability to do normal daily activities for 2 to 3 days. Most people got a sore arm with mild or moderate pain after getting Shingrix, and some also had redness and swelling where they got the shot. Some people felt tired, had muscle pain, a headache, shivering, fever, stomach pain, or nausea. About 1 out of 6 people who got Shingrix experienced side effects that prevented them from doing regular activities. Symptoms went away on their own in about 2 to 3 days. Side effects were more common in younger people. You might have a reaction to the first or second dose of Shingrix, or both doses. If you experience side effects, you may choose to take over-the-counter pain medicine such as ibuprofen or acetaminophen. If you experience side effects from Shingrix, you should report them to the Vaccine Adverse Event Reporting System (VAERS). Your doctor might file this report, or you can do it yourself through the VAERS websiteExternal, or by calling [phone]. If you have any questions about side effects from Shingrix, talk with your doctor. The shingles vaccine does not contain thimerosal (a preservative containing mercury). Top of Page  When Should I See a Doctor Because of the Side Effects I Experience From Shingrix? In clinical trials, Shingrix was not associated with serious adverse events.  In fact, serious side effects from vaccines are extremely rare. For example, for every 1 million doses of a vaccine given, only one or two people may have a severe allergic reaction. Signs of an allergic reaction happen within minutes or hours after vaccination and include hives, swelling of the face and throat, difficulty breathing, a fast heartbeat, dizziness, or weakness. If you experience these or any other life-threatening symptoms, see a doctor right away. Shingrix causes a strong response in your immune system, so it may produce short-term side effects more  intense than you are used to from other vaccines. These side effects can be uncomfortable, but they are expected and usually go away on their own in 2 or 3 days. Top of Page  How Can I Pay For Shingrix? There are several ways shingles vaccine may be paid for: Medicare . Medicare Part D plans cover the shingles vaccine, but there may be a cost to you depending on your plan. There may be a copay for the vaccine, or you may need to pay in full then get reimbursed for a certain amount.  . Medicare Part B does not cover the shingles vaccine. Medicaid . Medicaid may or may not cover the vaccine. Contact your insurer to find out. Private health insurance . Many private health insurance plans will cover the vaccine, but there may be a cost to you depending on your plan. Contact your insurer to find out. Vaccine assistance programs . Some pharmaceutical companies provide vaccines to eligible adults who cannot afford them. You may want to check with the vaccine manufacturer, [employer], about Shingrix. If you do not currently have health insurance, learn more about affordable health coverage optionsExternal. To find doctor's offices or pharmacies near you that offer the vaccine, visit HealthMap Vaccine FinderExternal.   Coronavirus (COVID-19) Are you at risk?  Are you at risk for the Coronavirus (COVID-19)?  To be considered HIGH RISK for Coronavirus (COVID-19), you have to meet the following criteria:  . Traveled to [place], [place], [place], [place] or [place]; or in the [place] to [place], [place], [place]  . or [place]; and have fever, cough, and shortness of breath within the last 2 weeks of travel OR . Been in close contact with a person diagnosed with COVID-19 within the last 2 weeks and have  . fever, cough,and shortness of breath .  . IF YOU DO NOT MEET THESE CRITERIA, YOU ARE CONSIDERED LOW RISK FOR COVID-19.  What to do if you are HIGH RISK for COVID-19?  Marland Kitchen If you  are having a medical emergency, call 911. . Seek medical care right away. Before you go to a doctor's office, urgent care or emergency department, .  call ahead and tell them about your recent travel, contact with someone diagnosed with COVID-19  .  and your symptoms.  . You should receive instructions from your physician's office regarding next steps of care.  . When you arrive at healthcare provider, tell the healthcare staff immediately you have returned from  . visiting [place], [place], [place], [place] or [place]; or traveled in the [place] to [place], [place],  . [place] or [place] in the last two weeks or you have been in close contact with a person diagnosed with  . COVID-19 in the last 2 weeks.   . Tell the health care staff about your symptoms: fever, cough and shortness of breath. . After you have been seen by a medical provider, you  will be either: o Tested for (COVID-19) and discharged home on quarantine except to seek medical care if  o symptoms worsen, and asked to  - Stay home and avoid contact with others until you get your results (4-5 days)  - Avoid travel on public transportation if possible (such as bus, train, or airplane) or o Sent to the Emergency Department by EMS for evaluation, COVID-19 testing  and  o possible admission depending on your condition and test results.  What to do if you are LOW RISK for COVID-19?  Reduce your risk of any infection by using the same precautions used for avoiding the common cold or flu:  Marland Kitchen Wash your hands often with soap and warm water for at least 20 seconds.  If soap and water are not readily available,  . use an alcohol-based hand sanitizer with at least 60% alcohol.  . If coughing or sneezing, cover your mouth and nose by coughing or sneezing into the elbow areas of your shirt or coat, .  into a tissue or into your sleeve (not your hands). . Avoid shaking hands with others and consider head nods or verbal greetings  only. . Avoid touching your eyes, nose, or mouth with unwashed hands.  . Avoid close contact with people who are sick. . Avoid places or events with large numbers of people in one location, like concerts or sporting events. . Carefully consider travel plans you have or are making. . If you are planning any travel outside or inside the [place], visit the CDC's Travelers' Health webpage for the latest health notices. . If you have some symptoms but not all symptoms, continue to monitor at home and seek medical attention  . if your symptoms worsen. . If you are having a medical emergency, call 911. >>>>>>>>>>>>>>>>>>>>>>>>>>>> Preventive Care for Adults  A healthy lifestyle and preventive care can promote health and wellness. Preventive health guidelines for men include the following key practices:  A routine yearly physical is a good way to check with your health care provider about your health and preventative screening. It is a chance to share any concerns and updates on your health and to receive a thorough exam.  Visit your dentist for a routine exam and preventative care every 6 months. Brush your teeth twice a day and floss once a day. Good oral hygiene prevents tooth decay and gum disease.  The frequency of eye exams is based on your age, health, family medical history, use of contact lenses, and other factors. Follow your health care provider's recommendations for frequency of eye exams.  Eat a healthy diet. Foods such as vegetables, fruits, whole grains, low-fat dairy products, and lean protein foods contain the nutrients you need without too many calories. Decrease your intake of foods high in solid fats, added sugars, and salt. Eat the right amount of calories for you. Get information about a proper diet from your health care provider, if necessary.  Regular physical exercise is one of the most important things you can do for your health. Most adults should get at least 150 minutes of  moderate-intensity exercise (any activity that increases your heart rate and causes you to sweat) each week. In addition, most adults need muscle-strengthening exercises on 2 or more days a week.  Maintain a healthy weight. The body mass index (BMI) is a screening tool to identify possible weight problems. It provides an estimate of body fat based on height and weight. Your health care provider can find your  BMI and can help you achieve or maintain a healthy weight. For adults 20 years and older:  A BMI below 18.5 is considered underweight.  A BMI of 18.5 to 24.9 is normal.  A BMI of 25 to 29.9 is considered overweight.  A BMI of 30 and above is considered obese.  Maintain normal blood lipids and cholesterol levels by exercising and minimizing your intake of saturated fat. Eat a balanced diet with plenty of fruit and vegetables. Blood tests for lipids and cholesterol should begin at age 70 and be repeated every 5 years. If your lipid or cholesterol levels are high, you are over 50, or you are at high risk for heart disease, you may need your cholesterol levels checked more frequently. Ongoing high lipid and cholesterol levels should be treated with medicines if diet and exercise are not working.  If you smoke, find out from your health care provider how to quit. If you do not use tobacco, do not start.  Lung cancer screening is recommended for adults aged 55-80 years who are at high risk for developing lung cancer because of a history of smoking. A yearly low-dose CT scan of the lungs is recommended for people who have at least a 30-pack-year history of smoking and are a current smoker or have quit within the past 15 years. A pack year of smoking is smoking an average of 1 pack of cigarettes a day for 1 year (for example: 1 pack a day for 30 years or 2 packs a day for 15 years). Yearly screening should continue until the smoker has stopped smoking for at least 15 years. Yearly screening should be  stopped for people who develop a health problem that would prevent them from having lung cancer treatment.  If you choose to drink alcohol, do not have more than 2 drinks per day. One drink is considered to be 12 ounces (355 mL) of beer, 5 ounces (148 mL) of wine, or 1.5 ounces (44 mL) of liquor.  Avoid use of street drugs. Do not share needles with anyone. Ask for help if you need support or instructions about stopping the use of drugs.  High blood pressure causes heart disease and increases the risk of stroke. Your blood pressure should be checked at least every 1-2 years. Ongoing high blood pressure should be treated with medicines, if weight loss and exercise are not effective.  If you are 79-67 years old, ask your health care provider if you should take aspirin to prevent heart disease.  Diabetes screening involves taking a blood sample to check your fasting blood sugar level. This should be done once every 3 years, after age 69, if you are within normal weight and without risk factors for diabetes. Testing should be considered at a younger age or be carried out more frequently if you are overweight and have at least 1 risk factor for diabetes.  Colorectal cancer can be detected and often prevented. Most routine colorectal cancer screening begins at the age of 26 and continues through age 59. However, your health care provider may recommend screening at an earlier age if you have risk factors for colon cancer. On a yearly basis, your health care provider may provide home test kits to check for hidden blood in the stool. Use of a small camera at the end of a tube to directly examine the colon (sigmoidoscopy or colonoscopy) can detect the earliest forms of colorectal cancer. Talk to your health care provider about this at age  50, when routine screening begins. Direct exam of the colon should be repeated every 5-10 years through age 83, unless early forms of precancerous polyps or small growths are  found.   Talk with your health care provider about prostate cancer screening.  Testicular cancer screening isrecommended for adult males. Screening includes self-exam, a health care provider exam, and other screening tests. Consult with your health care provider about any symptoms you have or any concerns you have about testicular cancer.  Use sunscreen. Apply sunscreen liberally and repeatedly throughout the day. You should seek shade when your shadow is shorter than you. Protect yourself by wearing long sleeves, pants, a wide-brimmed hat, and sunglasses year round, whenever you are outdoors.  Once a month, do a whole-body skin exam, using a mirror to look at the skin on your back. Tell your health care provider about new moles, moles that have irregular borders, moles that are larger than a pencil eraser, or moles that have changed in shape or color.  Stay current with required vaccines (immunizations).  Influenza vaccine. All adults should be immunized every year.  Tetanus, diphtheria, and acellular pertussis (Td, Tdap) vaccine. An adult who has not previously received Tdap or who does not know his vaccine status should receive 1 dose of Tdap. This initial dose should be followed by tetanus and diphtheria toxoids (Td) booster doses every 10 years. Adults with an unknown or incomplete history of completing a 3-dose immunization series with Td-containing vaccines should begin or complete a primary immunization series including a Tdap dose. Adults should receive a Td booster every 10 years.  Varicella vaccine. An adult without evidence of immunity to varicella should receive 2 doses or a second dose if he has previously received 1 dose.  Human papillomavirus (HPV) vaccine. Males aged 13-21 years who have not received the vaccine previously should receive the 3-dose series. Males aged 22-26 years may be immunized. Immunization is recommended through the age of 82 years for any male who has sex with  males and did not get any or all doses earlier. Immunization is recommended for any person with an immunocompromised condition through the age of 26 years if he did not get any or all doses earlier. During the 3-dose series, the second dose should be obtained 4-8 weeks after the first dose. The third dose should be obtained 24 weeks after the first dose and 16 weeks after the second dose.  Zoster vaccine. One dose is recommended for adults aged 30 years or older unless certain conditions are present.    PREVNAR  - Pneumococcal 13-valent conjugate (PCV13) vaccine. When indicated, a person who is uncertain of his immunization history and has no record of immunization should receive the PCV13 vaccine. An adult aged 45 years or older who has certain medical conditions and has not been previously immunized should receive 1 dose of PCV13 vaccine. This PCV13 should be followed with a dose of pneumococcal polysaccharide (PPSV23) vaccine. The PPSV23 vaccine dose should be obtained at least 1 r more year(s) after the dose of PCV13 vaccine. An adult aged 28 years or older who has certain medical conditions and previously received 1 or more doses of PPSV23 vaccine should receive 1 dose of PCV13. The PCV13 vaccine dose should be obtained 1 or more years after the last PPSV23 vaccine dose.    PNEUMOVAX - Pneumococcal polysaccharide (PPSV23) vaccine. When PCV13 is also indicated, PCV13 should be obtained first. All adults aged 33 years and older should be immunized. An  adult younger than age 28 years who has certain medical conditions should be immunized. Any person who resides in a nursing home or long-term care facility should be immunized. An adult smoker should be immunized. People with an immunocompromised condition and certain other conditions should receive both PCV13 and PPSV23 vaccines. People with human immunodeficiency virus (HIV) infection should be immunized as soon as possible after diagnosis. Immunization  during chemotherapy or radiation therapy should be avoided. Routine use of PPSV23 vaccine is not recommended for American Indians, [address], or people younger than 65 years unless there are medical conditions that require PPSV23 vaccine. When indicated, people who have unknown immunization and have no record of immunization should receive PPSV23 vaccine. One-time revaccination 5 years after the first dose of PPSV23 is recommended for people aged 19-64 years who have chronic kidney failure, nephrotic syndrome, asplenia, or immunocompromised conditions. People who received 1-2 doses of PPSV23 before age 34 years should receive another dose of PPSV23 vaccine at age 57 years or later if at least 5 years have passed since the previous dose. Doses of PPSV23 are not needed for people immunized with PPSV23 at or after age 9 years.    Hepatitis A vaccine. Adults who wish to be protected from this disease, have certain high-risk conditions, work with hepatitis A-infected animals, work in hepatitis A research labs, or travel to or work in countries with a high rate of hepatitis A should be immunized. Adults who were previously unvaccinated and who anticipate close contact with an international adoptee during the first 60 days after arrival in the [place] from a country with a high rate of hepatitis A should be immunized.    Hepatitis B vaccine. Adults should be immunized if they wish to be protected from this disease, have certain high-risk conditions, may be exposed to blood or other infectious body fluids, are household contacts or sex partners of hepatitis B positive people, are clients or workers in certain care facilities, or travel to or work in countries with a high rate of hepatitis B.   Preventive Service / Frequency   Ages 75 to 13  Blood pressure check.  Lipid and cholesterol check  Lung cancer screening. / Every year if you are aged 55-80 years and have a 30-pack-year history of  smoking and currently smoke or have quit within the past 15 years. Yearly screening is stopped once you have quit smoking for at least 15 years or develop a health problem that would prevent you from having lung cancer treatment.  Fecal occult blood test (FOBT) of stool. / Every year beginning at age 74 and continuing until age 91. You may not have to do this test if you get a colonoscopy every 10 years.  Flexible sigmoidoscopy** or colonoscopy.** / Every 5 years for a flexible sigmoidoscopy or every 10 years for a colonoscopy beginning at age 54 and continuing until age 37. Screening for abdominal aortic aneurysm (AAA)  by ultrasound is recommended for people who have history of high blood pressure or who are current or former smokers. +++++++++++ Recommend Adult Low Dose Aspirin or  coated  Aspirin 81 mg daily  To reduce risk of Colon Cancer 20 %,  Skin Cancer 26 % ,  Malignant Melanoma 46%  and  Pancreatic cancer 60% ++++++++++++++++++++ Vitamin D goal  is between 70-100.  Please make sure that you are taking your Vitamin D as directed.  It is very important as a natural anti-inflammatory  helping hair, skin, and  nails, as well as reducing stroke and heart attack risk.  It helps your bones and helps with mood. It also decreases numerous cancer risks so please take it as directed.  Low Vit D is associated with a 200-300% higher risk for CANCER  and 200-300% higher risk for HEART   ATTACK  &  STROKE.   .....................................Marland Kitchen It is also associated with higher death rate at younger ages,  autoimmune diseases like Rheumatoid arthritis, Lupus, Multiple Sclerosis.    Also many other serious conditions, like depression, Alzheimer's Dementia, infertility, muscle aches, fatigue, fibromyalgia - just to name a few. +++++++++++++++++++++ Recommend the book "The END of DIETING" by Dr Monico Hoar  & the book "The END of DIABETES " by Dr Monico Hoar At [email or web address] - get book &  Audio CD's    Being diabetic has a  300% increased risk for heart attack, stroke, cancer, and alzheimer- type vascular dementia. It is very important that you work harder with diet by avoiding all foods that are white. Avoid white rice (brown & wild rice is OK), white potatoes (sweetpotatoes in moderation is OK), White bread or wheat bread or anything made out of white flour like bagels, donuts, rolls, buns, biscuits, cakes, pastries, cookies, pizza crust, and pasta (made from white flour & egg whites) - vegetarian pasta or spinach or wheat pasta is OK. Multigrain breads like Arnold's or Pepperidge Farm, or multigrain sandwich thins or flatbreads.  Diet, exercise and weight loss can reverse and cure diabetes in the early stages.  Diet, exercise and weight loss is very important in the control and prevention of complications of diabetes which affects every system in your body, ie. Brain - dementia/stroke, eyes - glaucoma/blindness, heart - heart attack/heart failure, kidneys - dialysis, stomach - gastric paralysis, intestines - malabsorption, nerves - severe painful neuritis, circulation - gangrene & loss of a leg(s), and finally cancer and Alzheimers.    I recommend avoid fried & greasy foods,  sweets/candy, white rice (brown or wild rice or Quinoa is OK), white potatoes (sweet potatoes are OK) - anything made from white flour - bagels, doughnuts, rolls, buns, biscuits,white and wheat breads, pizza crust and traditional pasta made of white flour & egg white(vegetarian pasta or spinach or wheat pasta is OK).  Multi-grain bread is OK - like multi-grain flat bread or sandwich thins. Avoid alcohol in excess. Exercise is also important.    Eat all the vegetables you want - avoid meat, especially red meat and dairy - especially cheese.  Cheese is the most concentrated form of trans-fats which is the worst thing to clog up our arteries. Veggie cheese is OK which can be found in the fresh produce section at  [employer] or [employer] or [employer]  ++++++++++++++++++++++ DASH Eating Plan  DASH stands for "Dietary Approaches to Stop Hypertension."   The DASH eating plan is a healthy eating plan that has been shown to reduce high blood pressure (hypertension). Additional health benefits may include reducing the risk of type 2 diabetes mellitus, heart disease, and stroke. The DASH eating plan may also help with weight loss. WHAT DO I NEED TO KNOW ABOUT THE DASH EATING PLAN? For the DASH eating plan, you will follow these general guidelines:  Choose foods with a percent daily value for sodium of less than 5% (as listed on the food label).  Use salt-free seasonings or herbs instead of table salt or sea salt.  Check with your health care provider or pharmacist before using salt  substitutes.  Eat lower-sodium products, often labeled as "lower sodium" or "no salt added."  Eat fresh foods.  Eat more vegetables, fruits, and low-fat dairy products.  Choose whole grains. Look for the word "whole" as the first word in the ingredient list.  Choose fish   Limit sweets, desserts, sugars, and sugary drinks.  Choose heart-healthy fats.  Eat veggie cheese   Eat more home-cooked food and less restaurant, buffet, and fast food.  Limit fried foods.  Cook foods using methods other than frying.  Limit canned vegetables. If you do use them, rinse them well to decrease the sodium.  When eating at a restaurant, ask that your food be prepared with less salt, or no salt if possible.                      WHAT FOODS CAN I EAT? Read Dr Francis Dowse Fuhrman's books on The End of Dieting & The End of Diabetes  Grains Whole grain or whole wheat bread. Brown rice. Whole grain or whole wheat pasta. Quinoa, bulgur, and whole grain cereals. Low-sodium cereals. Corn or whole wheat flour tortillas. Whole grain cornbread. Whole grain crackers. Low-sodium crackers.  Vegetables Fresh or frozen vegetables (raw, steamed,  roasted, or grilled). Low-sodium or reduced-sodium tomato and vegetable juices. Low-sodium or reduced-sodium tomato sauce and paste. Low-sodium or reduced-sodium canned vegetables.   Fruits All fresh, canned (in natural juice), or frozen fruits.  Protein Products  All fish and seafood.  Dried beans, peas, or lentils. Unsalted nuts and seeds. Unsalted canned beans.  Dairy Low-fat dairy products, such as skim or 1% milk, 2% or reduced-fat cheeses, low-fat ricotta or cottage cheese, or plain low-fat yogurt. Low-sodium or reduced-sodium cheeses.  Fats and Oils Tub margarines without trans fats. Light or reduced-fat mayonnaise and salad dressings (reduced sodium). Avocado. Safflower, olive, or canola oils. Natural peanut or almond butter.  Other Unsalted popcorn and pretzels. The items listed above may not be a complete list of recommended foods or beverages. Contact your dietitian for more options.  +++++++++++++++++++  WHAT FOODS ARE NOT RECOMMENDED? Grains/ White flour or wheat flour White bread. White pasta. White rice. Refined cornbread. Bagels and croissants. Crackers that contain trans fat.  Vegetables  Creamed or fried vegetables. Vegetables in a . Regular canned vegetables. Regular canned tomato sauce and paste. Regular tomato and vegetable juices.  Fruits Dried fruits. Canned fruit in light or heavy syrup. Fruit juice.  Meat and Other Protein Products Meat in general - RED meat & White meat.  Fatty cuts of meat. Ribs, chicken wings, all processed meats as bacon, sausage, bologna, salami, fatback, hot dogs, bratwurst and packaged luncheon meats.  Dairy Whole or 2% milk, cream, half-and-half, and cream cheese. Whole-fat or sweetened yogurt. Full-fat cheeses or blue cheese. Non-dairy creamers and whipped toppings. Processed cheese, cheese spreads, or cheese curds.  Condiments Onion and garlic salt, seasoned salt, table salt, and sea salt. Canned and packaged gravies.  Worcestershire sauce. Tartar sauce. Barbecue sauce. Teriyaki sauce. Soy sauce, including reduced sodium. Steak sauce. Fish sauce. Oyster sauce. Cocktail sauce. Horseradish. Ketchup and mustard. Meat flavorings and tenderizers. Bouillon cubes. Hot sauce. Tabasco sauce. Marinades. Taco seasonings. Relishes.  Fats and Oils Butter, stick margarine, lard, shortening and bacon fat. Coconut, palm kernel, or palm oils. Regular salad dressings.  Pickles and olives. Salted popcorn and pretzels.  The items listed above may not be a complete list of foods and beverages to avoid.

## 2018-07-07 LAB — CBC WITH DIFFERENTIAL/PLATELET
Absolute Monocytes: 672 cells/uL (ref 200–950)
Basophils Absolute: 42 cells/uL (ref 0–200)
Basophils Relative: 0.6 %
Eosinophils Absolute: 77 cells/uL (ref 15–500)
Eosinophils Relative: 1.1 %
HCT: 45 % (ref 38.5–50.0)
Hemoglobin: 16.1 g/dL (ref 13.2–17.1)
Lymphs Abs: 2058 cells/uL (ref 850–3900)
MCH: 33.8 pg — ABNORMAL HIGH (ref 27.0–33.0)
MCHC: 35.8 g/dL (ref 32.0–36.0)
MCV: 94.5 fL (ref 80.0–100.0)
MPV: 9 fL (ref 7.5–12.5)
Monocytes Relative: 9.6 %
Neutro Abs: 4151 cells/uL (ref 1500–7800)
Neutrophils Relative %: 59.3 %
Platelets: 302 10*3/uL (ref 140–400)
RBC: 4.76 10*6/uL (ref 4.20–5.80)
RDW: 12.1 % (ref 11.0–15.0)
Total Lymphocyte: 29.4 %
WBC: 7 10*3/uL (ref 3.8–10.8)

## 2018-07-07 LAB — COMPLETE METABOLIC PANEL WITH GFR
AG Ratio: 2 (calc) (ref 1.0–2.5)
ALT: 23 U/L (ref 9–46)
AST: 21 U/L (ref 10–35)
Albumin: 4.6 g/dL (ref 3.6–5.1)
Alkaline phosphatase (APISO): 67 U/L (ref 35–144)
BUN: 12 mg/dL (ref 7–25)
CO2: 21 mmol/L (ref 20–32)
Calcium: 9.9 mg/dL (ref 8.6–10.3)
Chloride: 105 mmol/L (ref 98–110)
Creat: 0.94 mg/dL (ref 0.70–1.25)
GFR, Est African American: 102 mL/min/{1.73_m2} (ref >=60)
GFR, Est Non African American: 88 mL/min/{1.73_m2} (ref >=60)
Globulin: 2.3 g/dL (calc) (ref 1.9–3.7)
Glucose, Bld: 90 mg/dL (ref 65–99)
Potassium: 4.4 mmol/L (ref 3.5–5.3)
Sodium: 138 mmol/L (ref 135–146)
Total Bilirubin: 0.8 mg/dL (ref 0.2–1.2)
Total Protein: 6.9 g/dL (ref 6.1–8.1)

## 2018-07-07 LAB — LIPID PANEL
Cholesterol: 181 mg/dL (ref <200)
HDL: 51 mg/dL (ref >=40)
LDL Cholesterol (Calc): 102 mg/dL (calc) — ABNORMAL HIGH
Non-HDL Cholesterol (Calc): 130 mg/dL (calc) — ABNORMAL HIGH (ref <130)
Total CHOL/HDL Ratio: 3.5 (calc) (ref <5.0)
Triglycerides: 160 mg/dL — ABNORMAL HIGH (ref <150)

## 2018-07-07 LAB — VITAMIN D 25 HYDROXY (VIT D DEFICIENCY, FRACTURES): Vit D, 25-Hydroxy: 56 ng/mL (ref 30–100)

## 2018-07-07 LAB — TSH: TSH: 4.55 mIU/L — ABNORMAL HIGH (ref 0.40–4.50)

## 2018-07-07 LAB — HEMOGLOBIN A1C
Hgb A1c MFr Bld: 5.5 % of total Hgb (ref <5.7)
Mean Plasma Glucose: 111 (calc)
eAG (mmol/L): 6.2 (calc)

## 2018-07-07 LAB — INSULIN, RANDOM: Insulin: 3.9 u[IU]/mL

## 2018-07-07 LAB — MAGNESIUM: Magnesium: 2.2 mg/dL (ref 1.5–2.5)

## 2018-07-10 ENCOUNTER — Encounter: Payer: Self-pay | Admitting: Adult Health Nurse Practitioner

## 2019-01-09 ENCOUNTER — Encounter: Payer: Self-pay | Admitting: Internal Medicine

## 2019-01-09 NOTE — Progress Notes (Signed)
Annual  Screening/Preventative Visit  & Comprehensive Evaluation & Examination     This very nice 58 y.o. MWM presents for a Screening /Preventative Visit & comprehensive evaluation and management of multiple medical co-morbidities.  Patient has been followed for HTN, HLD, Prediabetes and Vitamin D Deficiency.     HTN predates since 2004. Patient's BP has been controlled at home.  Today's BP is at goal - 130/82. Patient denies any cardiac symptoms as chest pain, palpitations, shortness of breath, dizziness or ankle swelling.     Patient's hyperlipidemia is controlled with diet. Last lipids were near goal:  Lab Results  Component Value Date   CHOL 181 07/06/2018   HDL 51 07/06/2018   LDLCALC 102 (H) 07/06/2018   TRIG 160 (H) 07/06/2018   CHOLHDL 3.5 07/06/2018      Patient has hx/o prediabetes (A1c 5.7% / 2011) and patient denies reactive hypoglycemic symptoms, visual blurring, diabetic polys or paresthesias. Last A1c was Normal & at goal:  Lab Results  Component Value Date   HGBA1C 5.5 07/06/2018       Finally, patient has history of Vitamin D Deficiency  ("33" / 2011)  and last vitamin D was near goal:  Lab Results  Component Value Date   VD25OH 56 07/06/2018   Current Outpatient Medications on File Prior to Visit  Medication Sig  . acyclovir (ZOVIRAX) 200 MG capsule as needed.   . ALPRAZolam (XANAX) 0.5 MG tablet Take 1/2 to 1 tablet if needed at hour of sleep  . aspirin 81 MG tablet Take 81 mg by mouth daily.   . cetirizine (ZYRTEC ALLERGY) 10 MG tablet Take 10 mg by mouth daily.  . Cholecalciferol (VITAMIN D PO) Take 5,000 Int'l Units by mouth daily.   . Flaxseed, Linseed, (FLAXSEED OIL PO) Take by mouth daily.  . meloxicam (MOBIC) 15 MG tablet Take 1/2 to 1 tablet daily with food for Pain & Inflammation - Please limit to 5 tabs /week to Avoid Kidney Damage  . Omega-3 Fatty Acids (FISH OIL PO) Take by mouth daily.  . Red Yeast Rice Extract (RED YEAST RICE PO) Take by  mouth daily.  . rizatriptan (MAXALT) 10 MG tablet Take 1 tablet (10 mg total) by mouth as needed for migraine. May repeat in 2 hours if needed  . sildenafil (VIAGRA) 100 MG tablet TAKE HALF A TABLET TO ONE TABLET DAILY AS NEEDED  . verapamil (CALAN-SR) 240 MG CR tablet Take 1 tablet Daily with food for BP & Cluster Headache Prevention   No current facility-administered medications on file prior to visit.    Allergies  Allergen Reactions  . Topamax [Topiramate]    Past Medical History:  Diagnosis Date  . Cluster headache   . Hyperlipidemia   . Hypertension   . Prediabetes   . Vitamin D deficiency    Health Maintenance  Topic Date Due  . COLONOSCOPY  04/24/2008  . INFLUENZA VACCINE  09/18/2018  . TETANUS/TDAP  07/05/2028  . Hepatitis C Screening  Completed  . HIV Screening  Completed   Immunization History  Administered Date(s) Administered  . Influenza-Unspecified 11/18/2016, 11/25/2017  . PPD Test 09/26/2013, 09/27/2014, 10/18/2015, 11/20/2016, 12/21/2017  . Pneumococcal-Unspecified 07/24/2008  . Td 04/14/2006, 04/09/2008  . Tdap 07/06/2018   [facility] - f/u due 2020. Desires to have Colon here in [place]  Past Surgical History:  Procedure Laterality Date  . APPENDECTOMY  1972  . INCISE AND DRAIN ABCESS  2008  right shoulder  . PLEURAL SCARIFICATION Left 1979   chest tube thoracostomy   Family History  Problem Relation Age of Onset  . Stroke Father    Social History   Socioeconomic History  . Marital status: Married    Spouse name: Lynden Ang  . Number of children: None  Occupational History  .  IT - Public affairs consultant    Tobacco Use  . Smoking status: Current Every Day Smoker  . Smokeless tobacco: Never Used  . Tobacco comment: smokes 5-6 cigarettes daily  Substance and Sexual Activity  . Alcohol use: Yes    Alcohol/week: 0.0 standard drinks    Comment: occ beer  . Drug use: No  . Sexual activity: Not on file     ROS Constitutional: Denies fever, chills, weight loss/gain, headaches, insomnia,  night sweats or change in appetite. Does c/o fatigue. Eyes: Denies redness, blurred vision, diplopia, discharge, itchy or watery eyes.  ENT: Denies discharge, congestion, post nasal drip, epistaxis, sore throat, earache, hearing loss, dental pain, Tinnitus, Vertigo, Sinus pain or snoring.  Cardio: Denies chest pain, palpitations, irregular heartbeat, syncope, dyspnea, diaphoresis, orthopnea, PND, claudication or edema Respiratory: denies cough, dyspnea, DOE, pleurisy, hoarseness, laryngitis or wheezing.  Gastrointestinal: Denies dysphagia, heartburn, reflux, water brash, pain, cramps, nausea, vomiting, bloating, diarrhea, constipation, hematemesis, melena, hematochezia, jaundice or hemorrhoids Genitourinary: Denies dysuria, frequency, urgency, nocturia, hesitancy, discharge, hematuria or flank pain Musculoskeletal: Denies arthralgia, myalgia, stiffness, Jt. Swelling, pain, limp or strain/sprain. Denies Falls. Skin: Denies puritis, rash, hives, warts, acne, eczema or change in skin lesion Neuro: No weakness, tremor, incoordination, spasms, paresthesia or pain Psychiatric: Denies confusion, memory loss or sensory loss. Denies Depression. Endocrine: Denies change in weight, skin, hair change, nocturia, and paresthesia, diabetic polys, visual blurring or hyper / hypo glycemic episodes.  Heme/Lymph: No excessive bleeding, bruising or enlarged lymph nodes.  Physical Exam  BP 130/82   Pulse 76   Temp (!) 97.2 F (36.2 C)   Resp 16   Ht 5' 10.5" (1.791 m)   Wt 193 lb 9.6 oz (87.8 kg)   BMI 27.39 kg/m   General Appearance: Well nourished and well groomed and in no apparent distress.  Eyes: PERRLA, EOMs, conjunctiva no swelling or erythema, normal fundi and vessels. Sinuses: No frontal/maxillary tenderness ENT/Mouth: EACs patent / TMs  nl. Nares clear without erythema, swelling, mucoid exudates. Oral hygiene is  good. No erythema, swelling, or exudate. Tongue normal, non-obstructing. Tonsils not swollen or erythematous. Hearing normal.  Neck: Supple, thyroid not palpable. No bruits, nodes or JVD. Respiratory: Respiratory effort normal.  BS equal and clear bilateral without rales, rhonci, wheezing or stridor. Cardio: Heart sounds are normal with regular rate and rhythm and no murmurs, rubs or gallops. Peripheral pulses are normal and equal bilaterally without edema. No aortic or femoral bruits. Chest: symmetric with normal excursions and percussion.  Abdomen: Soft, with Nl bowel sounds. Nontender, no guarding, rebound, hernias, masses, or organomegaly.  Lymphatics: Non tender without lymphadenopathy.  Musculoskeletal: Full ROM all peripheral extremities, joint stability, 5/5 strength, and normal gait. Skin: Warm and dry without rashes, lesions, cyanosis, clubbing or  ecchymosis.  Neuro: Cranial nerves intact, reflexes equal bilaterally. Normal muscle tone, no cerebellar symptoms. Sensation intact.  Pysch: Alert and oriented X 3 with normal affect, insight and judgment appropriate.   Assessment and Plan  1. Annual Preventative/Screening Exam   1. Encounter for general adult medical examination with abnormal findings   2. Essential hypertension  - EKG 12-Lead - [place], retroperitnl abd,  ltd - Urinalysis, Routine w reflex microscopic - Microalbumin / Creatinine Urine Ratio - CBC with Diff - COMPLETE METABOLIC PANEL WITH GFR - Magnesium - TSH  3. Hyperlipidemia, mixed  - EKG 12-Lead - [place], retroperitnl abd,  ltd - Lipid Profile - TSH  4. Abnormal glucose  - EKG 12-Lead - [place], retroperitnl abd,  ltd - Hemoglobin A1c ([facility]) - Insulin, random  5. Vitamin D deficiency  - Vitamin D (25 hydroxy)  6. Prediabetes  - EKG 12-Lead - [place], retroperitnl abd,  ltd - Hemoglobin A1c ([facility]) - Insulin, random  7. BPH with obstruction/lower urinary tract symptoms  - PSA  8. Screening  examination for pulmonary tuberculosis  - TB Skin Test  9. Screening for colorectal cancer  - Refer to GI for 10 yr f/u screening Colonoscopy  10. Prostate cancer screening  - PSA  11. Screening for ischemic heart disease  - EKG 12-Lead  12. Family history of cerebrovascular event  - EKG 12-Lead - [place], retroperitnl abd,  ltd - Lipid Profile  13. Smoker  - EKG 12-Lead - [place], retroperitnl abd,  ltd  14. Screening for AAA (aortic abdominal aneurysm)  - [place], retroperitnl abd,  ltd  15. Fatigue, unspecified type  - Iron,Total/Total Iron Binding Cap - Vitamin B12 - Testosterone, Total - CBC with Diff - TSH  16. Medication management  - Urinalysis, Routine w reflex microscopic - Microalbumin / Creatinine Urine Ratio - CBC with Diff - COMPLETE METABOLIC PANEL WITH GFR - Magnesium - Lipid Profile - TSH - Hemoglobin A1c ([facility]) - Insulin, random - Vitamin D (25 hydroxy)  17. Insomnia  - ALPRAZolam (XANAX) 0.5 MG tablet; Take 1/2-1 tablet at Bedtime ONLY if needed for Sleep &  limit to 5 days /week to avoid addiction  Dispense: 90 tablet; Refill: 0  18. Episodic cluster headache, not intractable  - verapamil (CALAN-SR) 240 MG CR tablet; Take 1 tablet Daily with food for BP & Cluster Headache Prevention  Dispense: 90 tablet; Refill: 3           Patient was counseled in prudent diet, weight control to achieve/maintain BMI less than 25, BP monitoring, regular exercise and medications as discussed.  Discussed med effects and SE's. Routine screening labs and tests as requested with regular follow-up as recommended. Over 40 minutes of exam, counseling, chart review and high complex critical decision making was performed   Kirtland Bouchard, MD

## 2019-01-09 NOTE — Patient Instructions (Signed)

## 2019-01-10 ENCOUNTER — Ambulatory Visit (INDEPENDENT_AMBULATORY_CARE_PROVIDER_SITE_OTHER): Payer: 59 | Admitting: Internal Medicine

## 2019-01-10 ENCOUNTER — Other Ambulatory Visit: Payer: Self-pay

## 2019-01-10 VITALS — BP 130/82 | HR 76 | Temp 97.2°F | Resp 16 | Ht 70.5 in | Wt 193.6 lb

## 2019-01-10 DIAGNOSIS — Z136 Encounter for screening for cardiovascular disorders: Secondary | ICD-10-CM

## 2019-01-10 DIAGNOSIS — N138 Other obstructive and reflux uropathy: Secondary | ICD-10-CM

## 2019-01-10 DIAGNOSIS — G4700 Insomnia, unspecified: Secondary | ICD-10-CM

## 2019-01-10 DIAGNOSIS — Z8249 Family history of ischemic heart disease and other diseases of the circulatory system: Secondary | ICD-10-CM

## 2019-01-10 DIAGNOSIS — E559 Vitamin D deficiency, unspecified: Secondary | ICD-10-CM

## 2019-01-10 DIAGNOSIS — Z0000 Encounter for general adult medical examination without abnormal findings: Secondary | ICD-10-CM

## 2019-01-10 DIAGNOSIS — Z0001 Encounter for general adult medical examination with abnormal findings: Secondary | ICD-10-CM

## 2019-01-10 DIAGNOSIS — R7303 Prediabetes: Secondary | ICD-10-CM

## 2019-01-10 DIAGNOSIS — R7309 Other abnormal glucose: Secondary | ICD-10-CM

## 2019-01-10 DIAGNOSIS — Z79899 Other long term (current) drug therapy: Secondary | ICD-10-CM

## 2019-01-10 DIAGNOSIS — Z125 Encounter for screening for malignant neoplasm of prostate: Secondary | ICD-10-CM

## 2019-01-10 DIAGNOSIS — N401 Enlarged prostate with lower urinary tract symptoms: Secondary | ICD-10-CM

## 2019-01-10 DIAGNOSIS — Z111 Encounter for screening for respiratory tuberculosis: Secondary | ICD-10-CM

## 2019-01-10 DIAGNOSIS — E782 Mixed hyperlipidemia: Secondary | ICD-10-CM

## 2019-01-10 DIAGNOSIS — I10 Essential (primary) hypertension: Secondary | ICD-10-CM

## 2019-01-10 DIAGNOSIS — G44019 Episodic cluster headache, not intractable: Secondary | ICD-10-CM

## 2019-01-10 DIAGNOSIS — Z1211 Encounter for screening for malignant neoplasm of colon: Secondary | ICD-10-CM

## 2019-01-10 DIAGNOSIS — F17200 Nicotine dependence, unspecified, uncomplicated: Secondary | ICD-10-CM

## 2019-01-10 DIAGNOSIS — R5383 Other fatigue: Secondary | ICD-10-CM

## 2019-01-10 MED ORDER — VERAPAMIL HCL ER 240 MG PO TBCR: EXTENDED_RELEASE_TABLET | ORAL | 3 refills | Status: DC

## 2019-01-10 MED ORDER — MELOXICAM 15 MG PO TABS: ORAL_TABLET | ORAL | 3 refills | Status: DC

## 2019-01-10 MED ORDER — ALPRAZOLAM 0.5 MG PO TABS: ORAL_TABLET | ORAL | 0 refills | Status: DC

## 2019-01-11 DIAGNOSIS — Z111 Encounter for screening for respiratory tuberculosis: Secondary | ICD-10-CM | POA: Diagnosis not present

## 2019-01-11 LAB — COMPLETE METABOLIC PANEL WITH GFR
AG Ratio: 1.9 (calc) (ref 1.0–2.5)
ALT: 22 U/L (ref 9–46)
AST: 23 U/L (ref 10–35)
Albumin: 4.4 g/dL (ref 3.6–5.1)
Alkaline phosphatase (APISO): 60 U/L (ref 35–144)
BUN: 15 mg/dL (ref 7–25)
CO2: 24 mmol/L (ref 20–32)
Calcium: 10.1 mg/dL (ref 8.6–10.3)
Chloride: 105 mmol/L (ref 98–110)
Creat: 1.12 mg/dL (ref 0.70–1.25)
GFR, Est African American: 82 mL/min/{1.73_m2} (ref >=60)
GFR, Est Non African American: 71 mL/min/{1.73_m2} (ref >=60)
Globulin: 2.3 g/dL (calc) (ref 1.9–3.7)
Glucose, Bld: 94 mg/dL (ref 65–99)
Potassium: 4.2 mmol/L (ref 3.5–5.3)
Sodium: 139 mmol/L (ref 135–146)
Total Bilirubin: 0.6 mg/dL (ref 0.2–1.2)
Total Protein: 6.7 g/dL (ref 6.1–8.1)

## 2019-01-11 LAB — URINALYSIS, ROUTINE W REFLEX MICROSCOPIC
Bilirubin Urine: NEGATIVE
Glucose, UA: NEGATIVE
Hgb urine dipstick: NEGATIVE
Ketones, ur: NEGATIVE
Leukocytes,Ua: NEGATIVE
Nitrite: NEGATIVE
Protein, ur: NEGATIVE
Specific Gravity, Urine: 1.012 (ref 1.001–1.03)
pH: 6.5 (ref 5.0–8.0)

## 2019-01-11 LAB — CBC WITH DIFFERENTIAL/PLATELET
Absolute Monocytes: 925 cells/uL (ref 200–950)
Basophils Absolute: 41 cells/uL (ref 0–200)
Basophils Relative: 0.6 %
Eosinophils Absolute: 88 cells/uL (ref 15–500)
Eosinophils Relative: 1.3 %
HCT: 43.7 % (ref 38.5–50.0)
Hemoglobin: 15.2 g/dL (ref 13.2–17.1)
Lymphs Abs: 1523 cells/uL (ref 850–3900)
MCH: 33.7 pg — ABNORMAL HIGH (ref 27.0–33.0)
MCHC: 34.8 g/dL (ref 32.0–36.0)
MCV: 96.9 fL (ref 80.0–100.0)
MPV: 8.9 fL (ref 7.5–12.5)
Monocytes Relative: 13.6 %
Neutro Abs: 4223 cells/uL (ref 1500–7800)
Neutrophils Relative %: 62.1 %
Platelets: 295 10*3/uL (ref 140–400)
RBC: 4.51 10*6/uL (ref 4.20–5.80)
RDW: 11.8 % (ref 11.0–15.0)
Total Lymphocyte: 22.4 %
WBC: 6.8 10*3/uL (ref 3.8–10.8)

## 2019-01-11 LAB — MAGNESIUM: Magnesium: 2.1 mg/dL (ref 1.5–2.5)

## 2019-01-11 LAB — IRON, TOTAL/TOTAL IRON BINDING CAP
%SAT: 36 % (calc) (ref 20–48)
Iron: 138 ug/dL (ref 50–180)
TIBC: 379 mcg/dL (calc) (ref 250–425)

## 2019-01-11 LAB — PSA: PSA: 0.3 ng/mL (ref <=4.0)

## 2019-01-11 LAB — LIPID PANEL
Cholesterol: 196 mg/dL (ref <200)
HDL: 59 mg/dL (ref >=40)
LDL Cholesterol (Calc): 105 mg/dL (calc) — ABNORMAL HIGH
Non-HDL Cholesterol (Calc): 137 mg/dL (calc) — ABNORMAL HIGH (ref <130)
Total CHOL/HDL Ratio: 3.3 (calc) (ref <5.0)
Triglycerides: 196 mg/dL — ABNORMAL HIGH (ref <150)

## 2019-01-11 LAB — HEMOGLOBIN A1C
Hgb A1c MFr Bld: 5.2 % of total Hgb (ref <5.7)
Mean Plasma Glucose: 103 (calc)
eAG (mmol/L): 5.7 (calc)

## 2019-01-11 LAB — INSULIN, RANDOM: Insulin: 10.9 u[IU]/mL

## 2019-01-11 LAB — VITAMIN D 25 HYDROXY (VIT D DEFICIENCY, FRACTURES): Vit D, 25-Hydroxy: 55 ng/mL (ref 30–100)

## 2019-01-11 LAB — TESTOSTERONE: Testosterone: 624 ng/dL (ref 250–827)

## 2019-01-11 LAB — MICROALBUMIN / CREATININE URINE RATIO
Creatinine, Urine: 94 mg/dL (ref 20–320)
Microalb Creat Ratio: 2 mcg/mg creat (ref <30)
Microalb, Ur: 0.2 mg/dL

## 2019-01-11 LAB — VITAMIN B12: Vitamin B-12: 488 pg/mL (ref 200–1100)

## 2019-01-11 LAB — TSH: TSH: 4.18 mIU/L (ref 0.40–4.50)

## 2019-01-17 LAB — TB SKIN TEST
Induration: 0 mm
TB Skin Test: NEGATIVE

## 2019-07-08 NOTE — Progress Notes (Signed)
6 Month Follow Up   Assessment and Plan:   Essential hypertension Continue current regimen Monitor blood pressure at home; patient to call if consistently greater than 140/90 Continue DASH diet.   Reminder to go to the ER if any CP, SOB, nausea, dizziness, severe HA, changes vision/speech, left arm numbness and tingling and jaw pain  Hyperlipidemia, mixed Continue Red yeast rice Discussed dietary and exercise modifications -     Lipid panel -     TSH  Hx of prediabetes Recent A1Cs at goal Discussed diet/exercise, weight management  Defer A1C; check CMP  Vitamin D deficiency Near goal at recent check; continue to recommend supplementation for goal of 60-100 Defer vitamin D level  Insomnia, unspecified type  Doing well on current regimen Alprazolam 1/2 tab prn, only needs 1-2/week  good sleep hygiene discussed  Erectile dysfunction, unspecified erectile dysfunction type Doing well Using sildenafil 100mg  PRN   BMI 27.0-27.9,adult Discussed dietary and exercise modifications  Medication management Minimal labs due to high deductible insurance, gets large lab bill  -     COMPLETE METABOLIC PANEL WITH GFR  Smoker, 30+ pack year history Discussed risks associated with tobacco use and advised to reduce or quit Patient is not ready to do so, but advised to consider strongly Will follow up at the next visit  -lung cancer screening with low dose CT discussed as recommended by guidelines based on age, number of pack year history.  Discussed risks of screening including but not limited to false positives on xray, further testing or consultation with specialist, and possible false negative CT as well. Understanding expressed, is interested but not at this time due to high deductible insurance.     Continue diet and meds as discussed. Further disposition pending results of labs. Discussed med's effects and SE's.   Over 30 minutes of exam, counseling, chart review, and critical  decision making was performed.   Future Appointments  Date Time Provider Department Center  02/06/2020  3:00 PM 02/08/2020, MD [facility] None    ----------------------------------------------------------------------------------------------------------------------  HPI 59 y.o. male  presents for 3 month follow up on HTN, HLD, history of pre-diabetes,insomnia, migraines, ED, weight and vitamin D deficiency.   He has hx of migraines, had when got off of verapamil, none since resuming.   He is prescribed xanax for insomnia, long hx of this, uses xanax sparingly, typically takes 1/2 tab 1-2 days per week.   he currently continues to smoke 0.75 pack a day average since 1975; ~35 pack year. Discussed risks associated with smoking, patient is not ready to quit. Discussed low dose screening CT, declines due to high deductible insurance.   BMI is Body mass index is 27.16 kg/m., he has been working on diet, not intentional exercise but walks a lot at work (several miles) and very active in yard.  Wt Readings from Last 3 Encounters:  07/11/19 192 lb (87.1 kg)  01/10/19 193 lb 9.6 oz (87.8 kg)  07/06/18 193 lb 12.8 oz (87.9 kg)    HTN predates 2004, on verapamil due to migraines  His blood pressure has been controlled at home, today their BP is BP: 130/76  He does not workout. He denies any cardiac symptoms, chest pains, palpitations, shortness of breath, dizziness or lower extremity edema.     He is on cholesterol medication omega 3 and Red Yeast Rice Supplement and denies myalgias. His cholesterol is not at goal. The cholesterol last visit was:   Lab Results  Component Value Date  CHOL 196 01/10/2019   HDL 59 01/10/2019   LDLCALC 105 (H) 01/10/2019   TRIG 196 (H) 01/10/2019   CHOLHDL 3.3 01/10/2019    He has not been working on diet and exercise for hx of prediabetes (A1c 5.7%, 2011), and denies foot ulcerations, paresthesia of the feet, polydipsia, polyuria and visual  disturbances. Last A1C in the office was:  Lab Results  Component Value Date   HGBA1C 5.2 01/10/2019    Last GFR:  Lab Results  Component Value Date   GFRNONAA 71 01/10/2019   Patient is on Vitamin D supplement.   Lab Results  Component Value Date   VD25OH 55 01/10/2019       Current Medications:  Current Outpatient Medications on File Prior to Visit  Medication Sig  . acyclovir (ZOVIRAX) 200 MG capsule as needed.   . ALPRAZolam (XANAX) 0.5 MG tablet Take 1/2-1 tablet at Bedtime ONLY if needed for Sleep &  limit to 5 days /week to avoid addiction  . aspirin 81 MG tablet Take 81 mg by mouth daily.   . cetirizine (ZYRTEC ALLERGY) 10 MG tablet Take 10 mg by mouth daily.  . Cholecalciferol (VITAMIN D PO) Take 5,000 Int'l Units by mouth daily.   . cyanocobalamin 1000 MCG tablet Take 1,000 mcg by mouth daily.  . Flaxseed, Linseed, (FLAXSEED OIL PO) Take by mouth daily.  . meloxicam (MOBIC) 15 MG tablet Take 1/2 to 1 tablet daily with food for Pain & Inflammation - Please limit to 5 tabs /week to Avoid Kidney Damage  . Omega-3 Fatty Acids (FISH OIL PO) Take by mouth daily.  . Red Yeast Rice Extract (RED YEAST RICE PO) Take by mouth daily.  . sildenafil (VIAGRA) 100 MG tablet TAKE HALF A TABLET TO ONE TABLET DAILY AS NEEDED  . verapamil (CALAN-SR) 240 MG CR tablet Take 1 tablet Daily with food for BP & Cluster Headache Prevention  . rizatriptan (MAXALT) 10 MG tablet Take 1 tablet (10 mg total) by mouth as needed for migraine. May repeat in 2 hours if needed (Patient not taking: Reported on 07/11/2019)   No current facility-administered medications on file prior to visit.    Allergies:  Allergies  Allergen Reactions  . Topamax [Topiramate]     Medical History:  Past Medical History:  Diagnosis Date  . Cluster headache   . Hyperlipidemia   . Hypertension   . Prediabetes   . Vitamin D deficiency    Family history- Reviewed and unchanged  Social history- Reviewed and  unchanged  Review of Systems:  Review of Systems  Constitutional: Negative for malaise/fatigue and weight loss.  HENT: Negative for hearing loss and tinnitus.   Eyes: Negative for blurred vision and double vision.  Respiratory: Negative for cough, shortness of breath and wheezing.   Cardiovascular: Negative for chest pain, palpitations, orthopnea, claudication and leg swelling.  Gastrointestinal: Negative for abdominal pain, blood in stool, constipation, diarrhea, heartburn, melena, nausea and vomiting.  Genitourinary: Negative.   Musculoskeletal: Negative for joint pain and myalgias.  Skin: Negative for rash.  Neurological: Negative for dizziness, tingling, sensory change, weakness and headaches.  Endo/Heme/Allergies: Negative for polydipsia.  Psychiatric/Behavioral: Negative.   All other systems reviewed and are negative.   Physical Exam: BP 130/76   Pulse 76   Temp 97.7 F (36.5 C)   Ht 5' 10.5" (1.791 m)   Wt 192 lb (87.1 kg)   SpO2 97%   BMI 27.16 kg/m  Wt Readings from Last 3 Encounters:  07/11/19 192 lb (87.1 kg)  01/10/19 193 lb 9.6 oz (87.8 kg)  07/06/18 193 lb 12.8 oz (87.9 kg)   General Appearance: Well nourished, in no apparent distress. Eyes: PERRLA, EOMs, conjunctiva no swelling or erythema Sinuses: No Frontal/maxillary tenderness ENT/Mouth: Ext aud canals clear, TMs without erythema, bulging. No erythema, swelling, or exudate on post pharynx.  Tonsils not swollen or erythematous. Hearing normal.  Neck: Supple, thyroid normal.  Respiratory: Respiratory effort normal, BS equal bilaterally without rales, rhonchi, wheezing or stridor.  Cardio: RRR with no MRGs. Brisk peripheral pulses without edema.  Abdomen: Soft, + BS.  Non tender, no guarding, rebound, hernias, masses. Lymphatics: Non tender without lymphadenopathy.  Musculoskeletal: Full ROM, 5/5 strength, normal gait Skin: Warm, dry without rashes, lesions, ecchymosis.  Neuro: Cranial nerves intact. No  cerebellar symptoms.  Psych: Awake and oriented X 3, normal affect, Insight and Judgment appropriate.    Izora Ribas, NP [facility] 14:30 PM

## 2019-07-11 ENCOUNTER — Encounter: Payer: Self-pay | Admitting: Adult Health

## 2019-07-11 ENCOUNTER — Other Ambulatory Visit: Payer: Self-pay

## 2019-07-11 ENCOUNTER — Ambulatory Visit (INDEPENDENT_AMBULATORY_CARE_PROVIDER_SITE_OTHER): Payer: 59 | Admitting: Adult Health

## 2019-07-11 VITALS — BP 130/76 | HR 76 | Temp 97.7°F | Ht 70.5 in | Wt 192.0 lb

## 2019-07-11 DIAGNOSIS — E559 Vitamin D deficiency, unspecified: Secondary | ICD-10-CM | POA: Diagnosis not present

## 2019-07-11 DIAGNOSIS — G44029 Chronic cluster headache, not intractable: Secondary | ICD-10-CM

## 2019-07-11 DIAGNOSIS — I10 Essential (primary) hypertension: Secondary | ICD-10-CM | POA: Diagnosis not present

## 2019-07-11 DIAGNOSIS — R7309 Other abnormal glucose: Secondary | ICD-10-CM

## 2019-07-11 DIAGNOSIS — G4700 Insomnia, unspecified: Secondary | ICD-10-CM | POA: Insufficient documentation

## 2019-07-11 DIAGNOSIS — Z79899 Other long term (current) drug therapy: Secondary | ICD-10-CM

## 2019-07-11 DIAGNOSIS — F17200 Nicotine dependence, unspecified, uncomplicated: Secondary | ICD-10-CM | POA: Insufficient documentation

## 2019-07-11 DIAGNOSIS — E782 Mixed hyperlipidemia: Secondary | ICD-10-CM

## 2019-07-11 NOTE — Patient Instructions (Addendum)
Goals    . Quit Smoking        High Triglycerides Eating Plan Triglycerides are a type of fat in the blood. High levels of triglycerides can increase your risk of heart disease and stroke. If your triglyceride levels are high, choosing the right foods can help lower your triglycerides and keep your heart healthy. Work with your health care provider or a diet and nutrition specialist (dietitian) to develop an eating plan that is right for you. What are tips for following this plan? General guidelines   Lose weight, if you are overweight. For most people, losing 5-10 lbs (2-5 kg) helps lower triglyceride levels. A weight-loss plan may include. ? 30 minutes of exercise at least 5 days a week. ? Reducing the amount of calories, sugar, and fat you eat.  Eat a wide variety of fresh fruits, vegetables, and whole grains. These foods are high in fiber.  Eat foods that contain healthy fats, such as fatty fish, nuts, seeds, and olive oil.  Avoid foods that are high in added sugar, added salt (sodium), saturated fat, and trans fat.  Avoid low-fiber, refined carbohydrates such as white bread, crackers, noodles, and white rice.  Avoid foods with partially hydrogenated oils (trans fats), such as fried foods or stick margarine.  Limit alcohol intake to no more than 1 drink a day for nonpregnant women and 2 drinks a day for men. One drink equals 12 oz of beer, 5 oz of wine, or 1 oz of hard liquor. Your health care provider may recommend that you drink less depending on your overall health. Reading food labels  Check food labels for the amount of saturated fat. Choose foods with no or very little saturated fat.  Check food labels for the amount of trans fat. Choose foods with no trans fat.  Check food labels for the amount of cholesterol. Choose foods low in cholesterol. Ask your dietitian how much cholesterol you should have each day.  Check food labels for the amount of sodium. Choose foods  with less than 140 milligrams (mg) per serving. Shopping  Buy dairy products labeled as nonfat (skim) or low-fat (1%).  Avoid buying processed or prepackaged foods. These are often high in added sugar, sodium, and fat. Cooking  Choose healthy fats when cooking, such as olive oil or canola oil.  Cook foods using lower fat methods, such as baking, broiling, boiling, or grilling.  Make your own sauces, dressings, and marinades when possible, instead of buying them. Store-bought sauces, dressings, and marinades are often high in sodium and sugar. Meal planning  Eat more home-cooked food and less restaurant, buffet, and fast food.  Eat fatty fish at least 2 times each week. Examples of fatty fish include salmon, trout, mackerel, tuna, and herring.  If you eat whole eggs, do not eat more than 3 egg yolks per week. What foods are recommended? The items listed may not be a complete list. Talk with your dietitian about what dietary choices are best for you. Grains Whole wheat or whole grain breads, crackers, cereals, and pasta. Unsweetened oatmeal. Bulgur. Barley. Quinoa. Brown rice. Whole wheat flour tortillas. Vegetables Fresh or frozen vegetables. Low-sodium canned vegetables. Fruits All fresh, canned (in natural juice), or frozen fruits. Meats and other protein foods Skinless chicken or [place]. Ground chicken or [place]. Lean cuts of pork, trimmed of fat. Fish and seafood, especially salmon, trout, and herring. Egg whites. Dried beans, peas, or lentils. Unsalted nuts or seeds. Unsalted canned beans. Natural peanut  or almond butter. Dairy Low-fat dairy products. Skim or low-fat (1%) milk. Reduced fat (2%) and low-sodium cheese. Low-fat ricotta cheese. Low-fat cottage cheese. Plain, low-fat yogurt. Fats and oils Tub margarine without trans fats. Light or reduced-fat mayonnaise. Light or reduced-fat salad dressings. Avocado. Safflower, olive, sunflower, soybean, and canola oils. What foods  are not recommended? The items listed may not be a complete list. Talk with your dietitian about what dietary choices are best for you. Grains White bread. White (regular) pasta. White rice. Cornbread. Bagels. Pastries. Crackers that contain trans fat. Vegetables Creamed or fried vegetables. Vegetables in a cheese sauce. Fruits Sweetened dried fruit. Canned fruit in syrup. Fruit juice. Meats and other protein foods Fatty cuts of meat. Ribs. Chicken wings. Berniece Salines. Sausage. Bologna. Salami. Chitterlings. Fatback. Hot dogs. Bratwurst. Packaged lunch meats. Dairy Whole or reduced-fat (2%) milk. Half-and-half. Cream cheese. Full-fat or sweetened yogurt. Full-fat cheese. Nondairy creamers. Whipped toppings. Processed cheese or cheese spreads. Cheese curds. Beverages Alcohol. Sweetened drinks, such as soda, lemonade, fruit drinks, or punches. Fats and oils Butter. Stick margarine. Lard. Shortening. Ghee. Bacon fat. Tropical oils, such as coconut, palm kernel, or palm oils. Sweets and desserts Corn syrup. Sugars. Honey. Molasses. Candy. Jam and jelly. Syrup. Sweetened cereals. Cookies. Pies. Cakes. Donuts. Muffins. Ice cream. Condiments Store-bought sauces, dressings, and marinades that are high in sugar, such as ketchup and barbecue sauce. Summary  High levels of triglycerides can increase the risk of heart disease and stroke. Choosing the right foods can help lower your triglycerides.  Eat plenty of fresh fruits, vegetables, and whole grains. Choose low-fat dairy and lean meats. Eat fatty fish at least twice a week.  Avoid processed and prepackaged foods with added sugar, sodium, saturated fat, and trans fat.  If you need suggestions or have questions about what types of food are good for you, talk with your health care provider or a dietitian. This information is not intended to replace advice given to you by your health care provider. Make sure you discuss any questions you have with your  health care provider. Document Revised: 01/16/2017 Document Reviewed: 04/08/2016 Elsevier Patient Education  2020 [place] is an easy to use noninvasive colon cancer screening test based on the latest advances in stool DNA science.   Colon cancer is 3rd most diagnosed cancer and 2nd leading cause of death in both men and women 49 years of age and older despite being one of the most preventable and treatable cancers if found early.  4 of out 5 people diagnosed with colon cancer have NO prior family history.  When caught EARLY 90% of colon cancer is curable.   More than 92% of [facility] patients have NO out of pocket cost for screening however only your insurer can confirm how [facility] would be covered for you. [facility] has a team of specialist that can help you contact your insurer and ask the right questions. Please call [phone] so they can help.   You will receive a short call from [facility] support center at [employer], when you receive a call they will say they are from [place],  to confirm your mailing address and give you more information.  When they calll you, it will appear on the caller ID as "[employer]" or in some cases only this number will appear, [phone].   [employer] will ship your collection kit directly to you. You will collect a single stool  sample in the privacy of your own home, no special preparation required. You will return the kit via [facility] pre-paid shipping or pick-up, in the same box it arrived in. Then I will contact you to discuss your results after I receive them from the laboratory.   If you have any questions or concerns, [facility] Customer Support Specialist are available 24 hours a day, 7 days a week at [phone] or go to [email or web address].

## 2019-07-12 ENCOUNTER — Other Ambulatory Visit: Payer: Self-pay | Admitting: Adult Health

## 2019-07-12 ENCOUNTER — Encounter: Payer: Self-pay | Admitting: Adult Health

## 2019-07-12 DIAGNOSIS — E039 Hypothyroidism, unspecified: Secondary | ICD-10-CM

## 2019-07-12 LAB — COMPLETE METABOLIC PANEL WITH GFR
AG Ratio: 1.8 (calc) (ref 1.0–2.5)
ALT: 29 U/L (ref 9–46)
AST: 23 U/L (ref 10–35)
Albumin: 4.4 g/dL (ref 3.6–5.1)
Alkaline phosphatase (APISO): 65 U/L (ref 35–144)
BUN: 15 mg/dL (ref 7–25)
CO2: 23 mmol/L (ref 20–32)
Calcium: 10.1 mg/dL (ref 8.6–10.3)
Chloride: 105 mmol/L (ref 98–110)
Creat: 0.96 mg/dL (ref 0.70–1.25)
GFR, Est African American: 98 mL/min/{1.73_m2} (ref >=60)
GFR, Est Non African American: 85 mL/min/{1.73_m2} (ref >=60)
Globulin: 2.5 g/dL (calc) (ref 1.9–3.7)
Glucose, Bld: 87 mg/dL (ref 65–99)
Potassium: 4.3 mmol/L (ref 3.5–5.3)
Sodium: 136 mmol/L (ref 135–146)
Total Bilirubin: 0.8 mg/dL (ref 0.2–1.2)
Total Protein: 6.9 g/dL (ref 6.1–8.1)

## 2019-07-12 LAB — LIPID PANEL
Cholesterol: 184 mg/dL (ref <200)
HDL: 54 mg/dL (ref >=40)
LDL Cholesterol (Calc): 93 mg/dL (calc)
Non-HDL Cholesterol (Calc): 130 mg/dL (calc) — ABNORMAL HIGH (ref <130)
Total CHOL/HDL Ratio: 3.4 (calc) (ref <5.0)
Triglycerides: 261 mg/dL — ABNORMAL HIGH (ref <150)

## 2019-07-12 LAB — TSH: TSH: 5.12 mIU/L — ABNORMAL HIGH (ref 0.40–4.50)

## 2019-07-12 MED ORDER — LEVOTHYROXINE SODIUM 50 MCG PO TABS: ORAL_TABLET | ORAL | 0 refills | Status: DC

## 2019-09-04 ENCOUNTER — Other Ambulatory Visit: Payer: Self-pay | Admitting: Internal Medicine

## 2019-10-17 ENCOUNTER — Other Ambulatory Visit: Payer: Self-pay | Admitting: Adult Health

## 2019-12-26 ENCOUNTER — Other Ambulatory Visit: Payer: Self-pay | Admitting: Internal Medicine

## 2020-01-13 ENCOUNTER — Other Ambulatory Visit: Payer: Self-pay | Admitting: Internal Medicine

## 2020-01-13 DIAGNOSIS — G44019 Episodic cluster headache, not intractable: Secondary | ICD-10-CM

## 2020-02-05 ENCOUNTER — Encounter: Payer: Self-pay | Admitting: Internal Medicine

## 2020-02-05 NOTE — Patient Instructions (Signed)
Due to recent changes in healthcare laws, you may see the results of your imaging and laboratory studies on MyChart before your provider has had a chance to review them.  We understand that in some cases there may be results that are confusing or concerning to you. Not all laboratory results come back in the same time frame and the provider may be waiting for multiple results in order to interpret others.  Please give [place] 48 hours in order for your provider to thoroughly review all the results before contacting the office for clarification of your results.   ++++++++++++++++++++++++++++++++++++++  Vit D  & Vit C 1,000 mg   are recommended to help protect  against the Covid-19 and other Corona viruses.    Also it's recommended  to take  Zinc 50 mg  to help  protect against the Covid-19   and best place to get  is also on [email or web address]  and don't pay more than 6-8 cents /pill !  =============================== Coronavirus (COVID-19) Are you at risk?  Are you at risk for the Coronavirus (COVID-19)?  To be considered HIGH RISK for Coronavirus (COVID-19), you have to meet the following criteria:   Traveled to [place], [place], [place], [place] or [place]; or in the [place] to [place], [place], [place]   or [place]; and have fever, cough, and shortness of breath within the last 2 weeks of travel OR  Been in close contact with a person diagnosed with COVID-19 within the last 2 weeks and have   fever, cough,and shortness of breath    IF YOU DO NOT MEET THESE CRITERIA, YOU ARE CONSIDERED LOW RISK FOR COVID-19.  What to do if you are HIGH RISK for COVID-19?   If you are having a medical emergency, call 911.  Seek medical care right away. Before you go to a doctors office, urgent care or emergency department,   call ahead and tell them about your recent travel, contact with someone diagnosed with COVID-19    and your symptoms.   You should receive instructions from your  physicians office regarding next steps of care.   When you arrive at healthcare provider, tell the healthcare staff immediately you have returned from   visiting [place], [place], [place], [place] or [place]; or traveled in the [place] to [place], [place],   [place] or [place] in the last two weeks or you have been in close contact with a person diagnosed with   COVID-19 in the last 2 weeks.    Tell the health care staff about your symptoms: fever, cough and shortness of breath.  After you have been seen by a medical provider, you will be either: o Tested for (COVID-19) and discharged home on quarantine except to seek medical care if  o symptoms worsen, and asked to  - Stay home and avoid contact with others until you get your results (4-5 days)  - Avoid travel on public transportation if possible (such as bus, train, or airplane) or o Sent to the Emergency Department by EMS for evaluation, COVID-19 testing  and  o possible admission depending on your condition and test results.  What to do if you are LOW RISK for COVID-19?  Reduce your risk of any infection by using the same precautions used for avoiding the common cold or flu:   Wash your hands often with soap and warm water for at least 20 seconds.  If soap and water are not readily  available,   use an alcohol-based hand sanitizer with at least 60% alcohol.   If coughing or sneezing, cover your mouth and nose by coughing or sneezing into the elbow areas of your shirt or coat,   into a tissue or into your sleeve (not your hands).  Avoid shaking hands with others and consider head nods or verbal greetings only.  Avoid touching your eyes, nose, or mouth with unwashed hands.   Avoid close contact with people who are sick.  Avoid places or events with large numbers of people in one location, like concerts or sporting events.  Carefully consider travel plans you have or are making.  If you are planning any travel  outside or inside the [place], visit the [facility] webpage for the latest health notices.  If you have some symptoms but not all symptoms, continue to monitor at home and seek medical attention   if your symptoms worsen.  If you are having a medical emergency, call 911. >>>>>>>>>>>>>>>>>>>>>>>>>>>> Preventive Care for Adults  A healthy lifestyle and preventive care can promote health and wellness. Preventive health guidelines for men include the following key practices:  A routine yearly physical is a good way to check with your health care provider about your health and preventative screening. It is a chance to share any concerns and updates on your health and to receive a thorough exam.  Visit your dentist for a routine exam and preventative care every 6 months. Brush your teeth twice a day and floss once a day. Good oral hygiene prevents tooth decay and gum disease.  The frequency of eye exams is based on your age, health, family medical history, use of contact lenses, and other factors. Follow your health care provider's recommendations for frequency of eye exams.  Eat a healthy diet. Foods such as vegetables, fruits, whole grains, low-fat dairy products, and lean protein foods contain the nutrients you need without too many calories. Decrease your intake of foods high in solid fats, added sugars, and salt. Eat the right amount of calories for you. Get information about a proper diet from your health care provider, if necessary.  Regular physical exercise is one of the most important things you can do for your health. Most adults should get at least 150 minutes of moderate-intensity exercise (any activity that increases your heart rate and causes you to sweat) each week. In addition, most adults need muscle-strengthening exercises on 2 or more days a week.  Maintain a healthy weight. The body mass index (BMI) is a screening tool to identify possible weight problems. It provides an  estimate of body fat based on height and weight. Your health care provider can find your BMI and can help you achieve or maintain a healthy weight. For adults 20 years and older:  A BMI below 18.5 is considered underweight.  A BMI of 18.5 to 24.9 is normal.  A BMI of 25 to 29.9 is considered overweight.  A BMI of 30 and above is considered obese.  Maintain normal blood lipids and cholesterol levels by exercising and minimizing your intake of saturated fat. Eat a balanced diet with plenty of fruit and vegetables. Blood tests for lipids and cholesterol should begin at age 66 and be repeated every 5 years. If your lipid or cholesterol levels are high, you are over 50, or you are at high risk for heart disease, you may need your cholesterol levels checked more frequently. Ongoing high lipid and cholesterol levels should be treated  with medicines if diet and exercise are not working.  If you smoke, find out from your health care provider how to quit. If you do not use tobacco, do not start.  Lung cancer screening is recommended for adults aged 55-80 years who are at high risk for developing lung cancer because of a history of smoking. A yearly low-dose CT scan of the lungs is recommended for people who have at least a 30-pack-year history of smoking and are a current smoker or have quit within the past 15 years. A pack year of smoking is smoking an average of 1 pack of cigarettes a day for 1 year (for example: 1 pack a day for 30 years or 2 packs a day for 15 years). Yearly screening should continue until the smoker has stopped smoking for at least 15 years. Yearly screening should be stopped for people who develop a health problem that would prevent them from having lung cancer treatment.  If you choose to drink alcohol, do not have more than 2 drinks per day. One drink is considered to be 12 ounces (355 mL) of beer, 5 ounces (148 mL) of wine, or 1.5 ounces (44 mL) of liquor.  Avoid use of street  drugs. Do not share needles with anyone. Ask for help if you need support or instructions about stopping the use of drugs.  High blood pressure causes heart disease and increases the risk of stroke. Your blood pressure should be checked at least every 1-2 years. Ongoing high blood pressure should be treated with medicines, if weight loss and exercise are not effective.  If you are 6345-61 years old, ask your health care provider if you should take aspirin to prevent heart disease.  Diabetes screening involves taking a blood sample to check your fasting blood sugar level. This should be done once every 3 years, after age 61, if you are within normal weight and without risk factors for diabetes. Testing should be considered at a younger age or be carried out more frequently if you are overweight and have at least 1 risk factor for diabetes.  Colorectal cancer can be detected and often prevented. Most routine colorectal cancer screening begins at the age of 61 and continues through age 61. However, your health care provider may recommend screening at an earlier age if you have risk factors for colon cancer. On a yearly basis, your health care provider may provide home test kits to check for hidden blood in the stool. Use of a small camera at the end of a tube to directly examine the colon (sigmoidoscopy or colonoscopy) can detect the earliest forms of colorectal cancer. Talk to your health care provider about this at age 61, when routine screening begins. Direct exam of the colon should be repeated every 5-10 years through age 61, unless early forms of precancerous polyps or small growths are found.   Talk with your health care provider about prostate cancer screening.  Testicular cancer screening isrecommended for adult males. Screening includes self-exam, a health care provider exam, and other screening tests. Consult with your health care provider about any symptoms you have or any concerns you have about  testicular cancer.  Use sunscreen. Apply sunscreen liberally and repeatedly throughout the day. You should seek shade when your shadow is shorter than you. Protect yourself by wearing long sleeves, pants, a wide-brimmed hat, and sunglasses year round, whenever you are outdoors.  Once a month, do a whole-body skin exam, using a mirror to look at  the skin on your back. Tell your health care provider about new moles, moles that have irregular borders, moles that are larger than a pencil eraser, or moles that have changed in shape or color.  Stay current with required vaccines (immunizations).  Influenza vaccine. All adults should be immunized every year.  Tetanus, diphtheria, and acellular pertussis (Td, Tdap) vaccine. An adult who has not previously received Tdap or who does not know his vaccine status should receive 1 dose of Tdap. This initial dose should be followed by tetanus and diphtheria toxoids (Td) booster doses every 10 years. Adults with an unknown or incomplete history of completing a 3-dose immunization series with Td-containing vaccines should begin or complete a primary immunization series including a Tdap dose. Adults should receive a Td booster every 10 years.  Varicella vaccine. An adult without evidence of immunity to varicella should receive 2 doses or a second dose if he has previously received 1 dose.  Human papillomavirus (HPV) vaccine. Males aged 13-21 years who have not received the vaccine previously should receive the 3-dose series. Males aged 22-26 years may be immunized. Immunization is recommended through the age of 41 years for any male who has sex with males and did not get any or all doses earlier. Immunization is recommended for any person with an immunocompromised condition through the age of 26 years if he did not get any or all doses earlier. During the 3-dose series, the second dose should be obtained 4-8 weeks after the first dose. The third dose should be obtained  24 weeks after the first dose and 16 weeks after the second dose.  Zoster vaccine. One dose is recommended for adults aged 23 years or older unless certain conditions are present.    PREVNAR  - Pneumococcal 13-valent conjugate (PCV13) vaccine. When indicated, a person who is uncertain of his immunization history and has no record of immunization should receive the PCV13 vaccine. An adult aged 70 years or older who has certain medical conditions and has not been previously immunized should receive 1 dose of PCV13 vaccine. This PCV13 should be followed with a dose of pneumococcal polysaccharide (PPSV23) vaccine. The PPSV23 vaccine dose should be obtained at least 1 r more year(s) after the dose of PCV13 vaccine. An adult aged 6 years or older who has certain medical conditions and previously received 1 or more doses of PPSV23 vaccine should receive 1 dose of PCV13. The PCV13 vaccine dose should be obtained 1 or more years after the last PPSV23 vaccine dose.    PNEUMOVAX - Pneumococcal polysaccharide (PPSV23) vaccine. When PCV13 is also indicated, PCV13 should be obtained first. All adults aged 53 years and older should be immunized. An adult younger than age 42 years who has certain medical conditions should be immunized. Any person who resides in a nursing home or long-term care facility should be immunized. An adult smoker should be immunized. People with an immunocompromised condition and certain other conditions should receive both PCV13 and PPSV23 vaccines. People with human immunodeficiency virus (HIV) infection should be immunized as soon as possible after diagnosis. Immunization during chemotherapy or radiation therapy should be avoided. Routine use of PPSV23 vaccine is not recommended for American Indians, [address], or people younger than 65 years unless there are medical conditions that require PPSV23 vaccine. When indicated, people who have unknown immunization and have no record of  immunization should receive PPSV23 vaccine. One-time revaccination 5 years after the first dose of PPSV23 is recommended for people aged  19-64 years who have chronic kidney failure, nephrotic syndrome, asplenia, or immunocompromised conditions. People who received 1-2 doses of PPSV23 before age 45 years should receive another dose of PPSV23 vaccine at age 54 years or later if at least 5 years have passed since the previous dose. Doses of PPSV23 are not needed for people immunized with PPSV23 at or after age 62 years.    Hepatitis A vaccine. Adults who wish to be protected from this disease, have certain high-risk conditions, work with hepatitis A-infected animals, work in hepatitis A research labs, or travel to or work in countries with a high rate of hepatitis A should be immunized. Adults who were previously unvaccinated and who anticipate close contact with an international adoptee during the first 60 days after arrival in the [place] from a country with a high rate of hepatitis A should be immunized.    Hepatitis B vaccine. Adults should be immunized if they wish to be protected from this disease, have certain high-risk conditions, may be exposed to blood or other infectious body fluids, are household contacts or sex partners of hepatitis B positive people, are clients or workers in certain care facilities, or travel to or work in countries with a high rate of hepatitis B.   Preventive Service / Frequency   Ages 43 to 16  Blood pressure check.  Lipid and cholesterol check  Lung cancer screening. / Every year if you are aged 55-80 years and have a 30-pack-year history of smoking and currently smoke or have quit within the past 15 years. Yearly screening is stopped once you have quit smoking for at least 15 years or develop a health problem that would prevent you from having lung cancer treatment.  Fecal occult blood test (FOBT) of stool. / Every year beginning at age 13 and continuing  until age 23. You may not have to do this test if you get a colonoscopy every 10 years.  Flexible sigmoidoscopy** or colonoscopy.** / Every 5 years for a flexible sigmoidoscopy or every 10 years for a colonoscopy beginning at age 72 and continuing until age 75. Screening for abdominal aortic aneurysm (AAA)  by ultrasound is recommended for people who have history of high blood pressure or who are current or former smokers. +++++++++++ Recommend Adult Low Dose Aspirin or  coated  Aspirin 81 mg daily  To reduce risk of Colon Cancer 40 %,  Skin Cancer 26 % ,  Malignant Melanoma 46%  and  Pancreatic cancer 60% ++++++++++++++++++++ Vitamin D goal  is between 70-100.  Please make sure that you are taking your Vitamin D as directed.  It is very important as a natural anti-inflammatory  helping hair, skin, and nails, as well as reducing stroke and heart attack risk.  It helps your bones and helps with mood. It also decreases numerous cancer risks so please take it as directed.  Low Vit D is associated with a 200-300% higher risk for CANCER  and 200-300% higher risk for HEART   ATTACK  &  STROKE.   .....................................Marland Kitchen It is also associated with higher death rate at younger ages,  autoimmune diseases like Rheumatoid arthritis, Lupus, Multiple Sclerosis.    Also many other serious conditions, like depression, Alzheimer's Dementia, infertility, muscle aches, fatigue, fibromyalgia - just to name a few. +++++++++++++++++++++ Recommend the book "The END of DIETING" by Dr Monico Hoar  & the book "The END of DIABETES " by Dr Monico Hoar At [email or web address] - get book & Audio CD's  Being diabetic has a  300% increased risk for heart attack, stroke, cancer, and alzheimer- type vascular dementia. It is very important that you work harder with diet by avoiding all foods that are white. Avoid white rice (brown & wild rice is OK), white potatoes (sweetpotatoes in moderation is OK), White  bread or wheat bread or anything made out of white flour like bagels, donuts, rolls, buns, biscuits, cakes, pastries, cookies, pizza crust, and pasta (made from white flour & egg whites) - vegetarian pasta or spinach or wheat pasta is OK. Multigrain breads like Arnold's or [employer], or multigrain sandwich thins or flatbreads.  Diet, exercise and weight loss can reverse and cure diabetes in the early stages.  Diet, exercise and weight loss is very important in the control and prevention of complications of diabetes which affects every system in your body, ie. Brain - dementia/stroke, eyes - glaucoma/blindness, heart - heart attack/heart failure, kidneys - dialysis, stomach - gastric paralysis, intestines - malabsorption, nerves - severe painful neuritis, circulation - gangrene & loss of a leg(s), and finally cancer and Alzheimers.    I recommend avoid fried & greasy foods,  sweets/candy, white rice (brown or wild rice or Quinoa is OK), white potatoes (sweet potatoes are OK) - anything made from white flour - bagels, doughnuts, rolls, buns, biscuits,white and wheat breads, pizza crust and traditional pasta made of white flour & egg white(vegetarian pasta or spinach or wheat pasta is OK).  Multi-grain bread is OK - like multi-grain flat bread or sandwich thins. Avoid alcohol in excess. Exercise is also important.    Eat all the vegetables you want - avoid meat, especially red meat and dairy - especially cheese.  Cheese is the most concentrated form of trans-fats which is the worst thing to clog up our arteries. Veggie cheese is OK which can be found in the fresh produce section at [employer] or [employer] or [employer]  ++++++++++++++++++++++ DASH Eating Plan  DASH stands for "Dietary Approaches to Stop Hypertension."   The DASH eating plan is a healthy eating plan that has been shown to reduce high blood pressure (hypertension). Additional health benefits may include reducing the risk of type 2  diabetes mellitus, heart disease, and stroke. The DASH eating plan may also help with weight loss. WHAT DO I NEED TO KNOW ABOUT THE DASH EATING PLAN? For the DASH eating plan, you will follow these general guidelines:  Choose foods with a percent daily value for sodium of less than 5% (as listed on the food label).  Use salt-free seasonings or herbs instead of table salt or sea salt.  Check with your health care provider or pharmacist before using salt substitutes.  Eat lower-sodium products, often labeled as "lower sodium" or "no salt added."  Eat fresh foods.  Eat more vegetables, fruits, and low-fat dairy products.  Choose whole grains. Look for the word "whole" as the first word in the ingredient list.  Choose fish   Limit sweets, desserts, sugars, and sugary drinks.  Choose heart-healthy fats.  Eat veggie cheese   Eat more home-cooked food and less restaurant, buffet, and fast food.  Limit fried foods.  Cook foods using methods other than frying.  Limit canned vegetables. If you do use them, rinse them well to decrease the sodium.  When eating at a restaurant, ask that your food be prepared with less salt, or no salt if possible.  WHAT FOODS CAN I EAT? Read Dr Francis Dowse Fuhrman's books on The End of Dieting & The End of Diabetes  Grains Whole grain or whole wheat bread. Brown rice. Whole grain or whole wheat pasta. Quinoa, bulgur, and whole grain cereals. Low-sodium cereals. Corn or whole wheat flour tortillas. Whole grain cornbread. Whole grain crackers. Low-sodium crackers.  Vegetables Fresh or frozen vegetables (raw, steamed, roasted, or grilled). Low-sodium or reduced-sodium tomato and vegetable juices. Low-sodium or reduced-sodium tomato sauce and paste. Low-sodium or reduced-sodium canned vegetables.   Fruits All fresh, canned (in natural juice), or frozen fruits.  Protein Products  All fish and seafood.  Dried beans, peas, or lentils.  Unsalted nuts and seeds. Unsalted canned beans.  Dairy Low-fat dairy products, such as skim or 1% milk, 2% or reduced-fat cheeses, low-fat ricotta or cottage cheese, or plain low-fat yogurt. Low-sodium or reduced-sodium cheeses.  Fats and Oils Tub margarines without trans fats. Light or reduced-fat mayonnaise and salad dressings (reduced sodium). Avocado. Safflower, olive, or canola oils. Natural peanut or almond butter.  Other Unsalted popcorn and pretzels. The items listed above may not be a complete list of recommended foods or beverages. Contact your dietitian for more options.  +++++++++++++++++++  WHAT FOODS ARE NOT RECOMMENDED? Grains/ White flour or wheat flour White bread. White pasta. White rice. Refined cornbread. Bagels and croissants. Crackers that contain trans fat.  Vegetables  Creamed or fried vegetables. Vegetables in a . Regular canned vegetables. Regular canned tomato sauce and paste. Regular tomato and vegetable juices.  Fruits Dried fruits. Canned fruit in light or heavy syrup. Fruit juice.  Meat and Other Protein Products Meat in general - RED meat & White meat.  Fatty cuts of meat. Ribs, chicken wings, all processed meats as bacon, sausage, bologna, salami, fatback, hot dogs, bratwurst and packaged luncheon meats.  Dairy Whole or 2% milk, cream, half-and-half, and cream cheese. Whole-fat or sweetened yogurt. Full-fat cheeses or blue cheese. Non-dairy creamers and whipped toppings. Processed cheese, cheese spreads, or cheese curds.  Condiments Onion and garlic salt, seasoned salt, table salt, and sea salt. Canned and packaged gravies. Worcestershire sauce. Tartar sauce. Barbecue sauce. Teriyaki sauce. Soy sauce, including reduced sodium. Steak sauce. Fish sauce. Oyster sauce. Cocktail sauce. Horseradish. Ketchup and mustard. Meat flavorings and tenderizers. Bouillon cubes. Hot sauce. Tabasco sauce. Marinades. Taco seasonings. Relishes.  Fats and Oils Butter,  stick margarine, lard, shortening and bacon fat. Coconut, palm kernel, or palm oils. Regular salad dressings.  Pickles and olives. Salted popcorn and pretzels.  The items listed above may not be a complete list of foods and beverages to avoid.

## 2020-02-05 NOTE — Progress Notes (Signed)
Annual  Screening/Preventative Visit  & Comprehensive Evaluation & Examination      This very nice 59 y.o.  MWM presents for a Screening /Preventative Visit & comprehensive evaluation and management of multiple medical co-morbidities.  Patient has been followed expectantly for labile HTN, HLD, Prediabetes and Vitamin D Deficiency.     HTN predates circa 2004. Patient's BP has been controlled on Verapamil and today's BP is at goal -  136/86. Patient denies any cardiac symptoms as chest pain, palpitations, shortness of breath, dizziness or ankle swelling.      Patient's hyperlipidemia is controlled with diet. Last lipids were at goal except elevated Trig's:  Lab Results  Component Value Date   CHOL 184 07/11/2019   HDL 54 07/11/2019   LDLCALC 93 07/11/2019   TRIG 261 (H) 07/11/2019   CHOLHDL 3.4 07/11/2019       Patient has hx/o prediabetes (A1c 5.7% /2011) and patient denies reactive hypoglycemic symptoms, visual blurring, diabetic polys or paresthesias. Last A1c was normal & at gal:   Lab Results  Component Value Date   HGBA1C 5.2 01/10/2019        In May 2021, Patient was dx'd with mild Hypothyroidism and initiated on Thyroid Replacement.       Finally, patient has history of Vitamin D Deficiency ("33"/2011) and last vitamin D was at goal:   Lab Results  Component Value Date   VD25OH 55 01/10/2019    Current Outpatient Medications on File Prior to Visit  Medication Sig  . acyclovir 200 MG  as needed.   . ALPRAZolam  0.5 MG  Take 1/2-1 tablet at Bedtime ONLY if needed   . aspirin 81 MG t Take  daily.   . cetirizine  10 MG  Take  daily.  Marland Kitchen VITAMIN D 5,000 Units Take  daily.   . Vitamin B12  1000 MCG  Take  daily.  . Flaxseed Take  daily.  Marland Kitchen levothyroxine  50 MCG  1     TAB DAILY   . meloxicam15 MG  TAKE 1/2 TO 1 TAB DAILY   . Omega-3 FISH OIL  Take  daily.  . Red Yeast Rice Extract  Take daily.  . sildenafil  100 MG tablet Take 1/2 to 1 tablet Daily if Needed  for XXXX  . Verapamil -SR 240 MG  TAKE 1 TABLET DAILY     Allergies  Allergen Reactions  . Topamax [Topiramate]     Past Medical History:  Diagnosis Date  . Cluster headache   . Hyperlipidemia   . Hypertension   . Prediabetes   . Vitamin D deficiency    Health Maintenance  Topic Date Due  . COLONOSCOPY  Never done  . COVID-19 Vaccine (2 - Booster for [employer] series) 07/12/2019  . INFLUENZA VACCINE  09/18/2019  . TETANUS/TDAP  07/05/2028  . Hepatitis C Screening  Completed  . HIV Screening  Completed   Immunization History  Administered Date(s) Administered  . Influenza-Unspecified 11/18/2016, 11/25/2017  . Janssen (J&J) SARS-COV-2 Vaccination 05/17/2019  . PPD Test  11/20/2016, 12/21/2017, 01/11/2019  . Pneumococcal 07/24/2008  . Td 04/14/2006, 04/09/2008  . Tdap 07/06/2018    Last Colon - 2010 at [facility] - f/u due 2020. Desires to have Colon here in [place]   Past Surgical History:  Procedure Laterality Date  . APPENDECTOMY  1972  . INCISE AND DRAIN ABCESS  2008    right shoulder  . PLEURAL SCARIFICATION Left 1979   chest  tube thoracostomy    Social History   Socioeconomic History  . Marital status: Married    Spouse name: Lynden Ang  . Number of children: None  Occupational History  . IT - Public affairs consultant  Tobacco Use  . Smoking status: Current Every Day Smoker    Packs/day: 0.75    Years: 46.00    Pack years: 34.50    Types: Cigarettes    Start date: 12  . Smokeless tobacco: Never Used  Substance and Sexual Activity  . Alcohol use: Yes    Alcohol/week: 0.0 standard drinks    Comment: occ beer  . Drug use: No  . Sexual activity: Not on file    ROS Constitutional: Denies fever, chills, weight loss/gain, headaches, insomnia,  night sweats or change in appetite. Does c/o fatigue. Eyes: Denies redness, blurred vision, diplopia, discharge, itchy or watery eyes.  ENT: Denies discharge, congestion, post nasal drip, epistaxis,  sore throat, earache, hearing loss, dental pain, Tinnitus, Vertigo, Sinus pain or snoring.  Cardio: Denies chest pain, palpitations, irregular heartbeat, syncope, dyspnea, diaphoresis, orthopnea, PND, claudication or edema Respiratory: denies cough, dyspnea, DOE, pleurisy, hoarseness, laryngitis or wheezing.  Gastrointestinal: Denies dysphagia, heartburn, reflux, water brash, pain, cramps, nausea, vomiting, bloating, diarrhea, constipation, hematemesis, melena, hematochezia, jaundice or hemorrhoids Genitourinary: Denies dysuria, frequency,  discharge, hematuria or flank pain Musculoskeletal: Denies arthralgia, myalgia, stiffness, Jt. Swelling, pain, limp or strain/sprain. Denies Falls. Has urgency, nocturia x 0-2 x & occasional hesitancy. Skin: Denies puritis, rash, hives, warts, acne, eczema or change in skin lesion Neuro: No weakness, tremor, incoordination, spasms, paresthesia or pain Psychiatric: Denies confusion, memory loss or sensory loss. Denies Depression. Endocrine: Denies change in weight, skin, hair change, nocturia, and paresthesia, diabetic polys, visual blurring or hyper / hypo glycemic episodes.  Heme/Lymph: No excessive bleeding, bruising or enlarged lymph nodes.  Physical Exam  BP 136/86   Pulse 68   Temp (!) 97.4 F (36.3 C)   Resp 16   Ht 5' 10.5" (1.791 m)   Wt 189 lb 6.4 oz (85.9 kg)   SpO2 96%   BMI 26.79 kg/m   General Appearance: Well nourished and well groomed and in no apparent distress.  Eyes: PERRLA, EOMs, conjunctiva no swelling or erythema, normal fundi and vessels. Sinuses: No frontal/maxillary tenderness ENT/Mouth: EACs patent / TMs  nl. Nares clear without erythema, swelling, mucoid exudates. Oral hygiene is good. No erythema, swelling, or exudate. Tongue normal, non-obstructing. Tonsils not swollen or erythematous. Hearing normal.  Neck: Supple, thyroid not palpable. No bruits, nodes or JVD. Respiratory: Respiratory effort normal.  BS equal and clear  bilateral without rales, rhonci, wheezing or stridor. Cardio: Heart sounds are normal with regular rate and rhythm and no murmurs, rubs or gallops. Peripheral pulses are normal and equal bilaterally without edema. No aortic or femoral bruits. Chest: symmetric with normal excursions and percussion.  Abdomen: Soft, with Nl bowel sounds. Nontender, no guarding, rebound, hernias, masses, or organomegaly.  Lymphatics: Non tender without lymphadenopathy.  Musculoskeletal: Full ROM all peripheral extremities, joint stability, 5/5 strength, and normal gait. Skin: Warm and dry without rashes, lesions, cyanosis, clubbing or  ecchymosis.  Neuro: Cranial nerves intact, reflexes equal bilaterally. Normal muscle tone, no cerebellar symptoms. Sensation intact.  Pysch: Alert and oriented X 3 with normal affect, insight and judgment appropriate.   Assessment and Plan  1. Annual Preventative/Screening Exam    2. Essential hypertension  - EKG 12-Lead - [place], RETROPERITNL ABD,  LTD - CBC with Differential/Platelet - COMPLETE METABOLIC  PANEL WITH GFR - Magnesium - TSH  3. Hyperlipidemia, mixed  - EKG 12-Lead - [place], RETROPERITNL ABD,  LTD - Lipid panel - TSH  4. Abnormal glucose  - EKG 12-Lead - [place], RETROPERITNL ABD,  LTD - Hemoglobin A1c - Insulin, random  5. Vitamin D deficiency  - VITAMIN D 25 Hydroxy  6. Acquired hypothyroidism  - TSH  7. BPH with obstruction/lower urinary tract symptoms  - PSA  8. Screening examination for pulmonary tuberculosis  - TB Skin Test  9. Screening for colorectal cancer  - POC Hemoccult Bld/  10. Prostate cancer screening  - PSA  11. Screening for ischemic heart disease  - EKG 12-Lead  12. Family history of cerebrovascular event  - EKG 12-Lead - [place], RETROPERITNL ABD,  LTD  13. Smoker   14. Screening for AAA (aortic abdominal aneurysm)  - [place], RETROPERITNL ABD,  LTD  15. Fatigue  - Iron,Total/Total Iron Binding Cap - Vitamin  B12 - Testosterone - CBC with Differential/Platelet - TSH  16. Medication management  - Urinalysis, Routine w reflex microscopic - Microalbumin / creatinine urine ratio - CBC with Differential/Platelet - COMPLETE METABOLIC PANEL WITH GFR - Magnesium - Lipid panel - TSH - Hemoglobin A1c - Insulin, random - VITAMIN D 25 Hydroxy         Patient was counseled in prudent diet, weight control to achieve/maintain BMI less than 25, BP monitoring, regular exercise and medications as discussed.  Discussed med effects and SE's. Routine screening labs and tests as requested with regular follow-up as recommended. Over 40 minutes of exam, counseling, chart review and high complex critical decision making was performed   Marinus Maw, MD

## 2020-02-06 ENCOUNTER — Other Ambulatory Visit: Payer: Self-pay

## 2020-02-06 ENCOUNTER — Encounter: Payer: Self-pay | Admitting: Internal Medicine

## 2020-02-06 ENCOUNTER — Ambulatory Visit (INDEPENDENT_AMBULATORY_CARE_PROVIDER_SITE_OTHER): Payer: 59 | Admitting: Internal Medicine

## 2020-02-06 VITALS — BP 136/86 | HR 68 | Temp 97.4°F | Resp 16 | Ht 70.5 in | Wt 189.4 lb

## 2020-02-06 DIAGNOSIS — Z0000 Encounter for general adult medical examination without abnormal findings: Secondary | ICD-10-CM

## 2020-02-06 DIAGNOSIS — Z111 Encounter for screening for respiratory tuberculosis: Secondary | ICD-10-CM

## 2020-02-06 DIAGNOSIS — Z136 Encounter for screening for cardiovascular disorders: Secondary | ICD-10-CM | POA: Diagnosis not present

## 2020-02-06 DIAGNOSIS — Z0001 Encounter for general adult medical examination with abnormal findings: Secondary | ICD-10-CM

## 2020-02-06 DIAGNOSIS — I10 Essential (primary) hypertension: Secondary | ICD-10-CM | POA: Diagnosis not present

## 2020-02-06 DIAGNOSIS — G4700 Insomnia, unspecified: Secondary | ICD-10-CM

## 2020-02-06 DIAGNOSIS — F17200 Nicotine dependence, unspecified, uncomplicated: Secondary | ICD-10-CM

## 2020-02-06 DIAGNOSIS — N401 Enlarged prostate with lower urinary tract symptoms: Secondary | ICD-10-CM

## 2020-02-06 DIAGNOSIS — R5383 Other fatigue: Secondary | ICD-10-CM

## 2020-02-06 DIAGNOSIS — Z79899 Other long term (current) drug therapy: Secondary | ICD-10-CM

## 2020-02-06 DIAGNOSIS — R7309 Other abnormal glucose: Secondary | ICD-10-CM

## 2020-02-06 DIAGNOSIS — E039 Hypothyroidism, unspecified: Secondary | ICD-10-CM

## 2020-02-06 DIAGNOSIS — Z125 Encounter for screening for malignant neoplasm of prostate: Secondary | ICD-10-CM

## 2020-02-06 DIAGNOSIS — E559 Vitamin D deficiency, unspecified: Secondary | ICD-10-CM

## 2020-02-06 DIAGNOSIS — E782 Mixed hyperlipidemia: Secondary | ICD-10-CM

## 2020-02-06 DIAGNOSIS — Z8249 Family history of ischemic heart disease and other diseases of the circulatory system: Secondary | ICD-10-CM

## 2020-02-06 DIAGNOSIS — Z1212 Encounter for screening for malignant neoplasm of rectum: Secondary | ICD-10-CM

## 2020-02-06 MED ORDER — ALPRAZOLAM 0.5 MG PO TABS: ORAL_TABLET | ORAL | 0 refills | Status: DC

## 2020-02-07 ENCOUNTER — Other Ambulatory Visit: Payer: Self-pay | Admitting: Internal Medicine

## 2020-02-07 DIAGNOSIS — E039 Hypothyroidism, unspecified: Secondary | ICD-10-CM

## 2020-02-07 LAB — COMPLETE METABOLIC PANEL WITH GFR
AG Ratio: 2.1 (calc) (ref 1.0–2.5)
ALT: 26 U/L (ref 9–46)
AST: 23 U/L (ref 10–35)
Albumin: 4.8 g/dL (ref 3.6–5.1)
Alkaline phosphatase (APISO): 69 U/L (ref 35–144)
BUN: 14 mg/dL (ref 7–25)
CO2: 23 mmol/L (ref 20–32)
Calcium: 10.1 mg/dL (ref 8.6–10.3)
Chloride: 103 mmol/L (ref 98–110)
Creat: 0.93 mg/dL (ref 0.70–1.25)
GFR, Est African American: 102 mL/min/{1.73_m2} (ref >=60)
GFR, Est Non African American: 88 mL/min/{1.73_m2} (ref >=60)
Globulin: 2.3 g/dL (calc) (ref 1.9–3.7)
Glucose, Bld: 81 mg/dL (ref 65–99)
Potassium: 4.1 mmol/L (ref 3.5–5.3)
Sodium: 136 mmol/L (ref 135–146)
Total Bilirubin: 0.9 mg/dL (ref 0.2–1.2)
Total Protein: 7.1 g/dL (ref 6.1–8.1)

## 2020-02-07 LAB — IRON, TOTAL/TOTAL IRON BINDING CAP
%SAT: 40 % (calc) (ref 20–48)
Iron: 169 ug/dL (ref 50–180)
TIBC: 424 mcg/dL (calc) (ref 250–425)

## 2020-02-07 LAB — CBC WITH DIFFERENTIAL/PLATELET
Absolute Monocytes: 1032 cells/uL — ABNORMAL HIGH (ref 200–950)
Basophils Absolute: 42 cells/uL (ref 0–200)
Basophils Relative: 0.7 %
Eosinophils Absolute: 78 cells/uL (ref 15–500)
Eosinophils Relative: 1.3 %
HCT: 45.8 % (ref 38.5–50.0)
Hemoglobin: 16.5 g/dL (ref 13.2–17.1)
Lymphs Abs: 1710 cells/uL (ref 850–3900)
MCH: 35 pg — ABNORMAL HIGH (ref 27.0–33.0)
MCHC: 36 g/dL (ref 32.0–36.0)
MCV: 97 fL (ref 80.0–100.0)
MPV: 8.7 fL (ref 7.5–12.5)
Monocytes Relative: 17.2 %
Neutro Abs: 3138 cells/uL (ref 1500–7800)
Neutrophils Relative %: 52.3 %
Platelets: 295 10*3/uL (ref 140–400)
RBC: 4.72 10*6/uL (ref 4.20–5.80)
RDW: 12 % (ref 11.0–15.0)
Total Lymphocyte: 28.5 %
WBC: 6 10*3/uL (ref 3.8–10.8)

## 2020-02-07 LAB — HEMOGLOBIN A1C
Hgb A1c MFr Bld: 5.4 % of total Hgb (ref <5.7)
Mean Plasma Glucose: 108 mg/dL
eAG (mmol/L): 6 mmol/L

## 2020-02-07 LAB — URINALYSIS, ROUTINE W REFLEX MICROSCOPIC
Bilirubin Urine: NEGATIVE
Glucose, UA: NEGATIVE
Hgb urine dipstick: NEGATIVE
Ketones, ur: NEGATIVE
Leukocytes,Ua: NEGATIVE
Nitrite: NEGATIVE
Protein, ur: NEGATIVE
Specific Gravity, Urine: 1.008 (ref 1.001–1.03)
pH: 6 (ref 5.0–8.0)

## 2020-02-07 LAB — LIPID PANEL
Cholesterol: 201 mg/dL — ABNORMAL HIGH (ref <200)
HDL: 57 mg/dL (ref >=40)
LDL Cholesterol (Calc): 115 mg/dL (calc) — ABNORMAL HIGH
Non-HDL Cholesterol (Calc): 144 mg/dL (calc) — ABNORMAL HIGH (ref <130)
Total CHOL/HDL Ratio: 3.5 (calc) (ref <5.0)
Triglycerides: 175 mg/dL — ABNORMAL HIGH (ref <150)

## 2020-02-07 LAB — TSH: TSH: 5.19 mIU/L — ABNORMAL HIGH (ref 0.40–4.50)

## 2020-02-07 LAB — MICROALBUMIN / CREATININE URINE RATIO
Creatinine, Urine: 54 mg/dL (ref 20–320)
Microalb, Ur: <0.2 mg/dL

## 2020-02-07 LAB — MAGNESIUM: Magnesium: 2.5 mg/dL (ref 1.5–2.5)

## 2020-02-07 LAB — VITAMIN D 25 HYDROXY (VIT D DEFICIENCY, FRACTURES): Vit D, 25-Hydroxy: 44 ng/mL (ref 30–100)

## 2020-02-07 LAB — VITAMIN B12: Vitamin B-12: 532 pg/mL (ref 200–1100)

## 2020-02-07 LAB — INSULIN, RANDOM: Insulin: 8.4 u[IU]/mL

## 2020-02-07 LAB — TESTOSTERONE: Testosterone: 543 ng/dL (ref 250–827)

## 2020-02-07 LAB — PSA: PSA: 0.29 ng/mL (ref <=4.0)

## 2020-02-07 MED ORDER — LEVOTHYROXINE SODIUM 50 MCG PO TABS: ORAL_TABLET | ORAL | 1 refills | Status: DC

## 2020-02-07 NOTE — Progress Notes (Signed)
========================================================== - Test results slightly outside the reference range are not unusual. If there is anything important, I will review this with you,  otherwise it is considered normal test values.  If you have further questions,  please do not hesitate to contact me at the office or via My Chart.  ==========================================================  -  Iron & Vitamin B12 Levels - Both Normal & OK  ==========================================================  - PSA -Low - Great  ==========================================================  - Testosterone level - Normal  ==========================================================  - Total Chol = 201 - Slightly elevated  -(Last year was 184 (Ideal or Goal is less than 180)   - and   - Bad / LDL Chol has gone up from 93 to now 115 (Ideal or Goal is less than 70  !  )   -  Recommend a stricter low cholesterol diet, so  . . . . . . . . . . . don't have to start meds for cholesterol  - Cholesterol only comes from animal sources  - ie. meat, dairy, egg yolks  - Eat all the vegetables you want.  - Avoid meat, especially red meat - Beef AND Pork .  - Avoid cheese & dairy - milk & ice cream.     - Cheese is the most concentrated form of trans-fats which  is the worst thing to clog up our arteries.   - Veggie cheese is OK which can be found in the fresh  produce section at [employer] or [employer] or [employer] ==========================================================  - TSH is slightly elevated again which means thyroid hormone is low in blood  - I'd rather you be a little on the high side than the low side   -so I sent in a new Rx for a little higher dose of levothyroxine   - please call office to schedule a lab visit in about a month to   recheck your thyroid level (TSH) ==========================================================  -  A1c  - Normal - Great - No  Diabetes ========================================================== ==========================================================  -  Vit D = 44 - Low   - Vitamin D goal is between 70-100.   ooooooooooooooooooooooooooooooooooooooooooooooooooooooooooooo  - Please INCREASE your Vitamin D 5,000 units up to   2 capsules = 10,000 units /day  ooooooooooooooooooooooooooooooooooooooooooooooooooooooooooooo  - It is very important as a natural anti-inflammatory and helping the  immune system protect against viral infections, like the Covid-19    helping hair, skin, and nails, as well as reducing stroke and  heart attack risk.   - It helps your bones and helps with mood.  - It also decreases numerous cancer risks so please  take it as directed.   - Low Vit D is associated with a 200-300% higher risk for  CANCER   and 200-300% higher risk for HEART   ATTACK  &  STROKE.    - It is also associated with higher death rate at younger ages,   autoimmune diseases like Rheumatoid arthritis, Lupus,  Multiple Sclerosis.     - Also many other serious conditions, like depression, Alzheimer's  Dementia, infertility, muscle aches, fatigue, fibromyalgia   - just to name a few. ==========================================================  -   All Else -       CBC -    Kidneys -     Electrolytes -     Liver &  Magnesium   - all  Normal / OK ==========================================================

## 2020-06-17 ENCOUNTER — Other Ambulatory Visit: Payer: Self-pay | Admitting: Internal Medicine

## 2020-06-17 DIAGNOSIS — N5201 Erectile dysfunction due to arterial insufficiency: Secondary | ICD-10-CM

## 2020-06-17 MED ORDER — SILDENAFIL CITRATE 100 MG PO TABS: ORAL_TABLET | ORAL | 0 refills | Status: DC

## 2020-06-27 ENCOUNTER — Other Ambulatory Visit: Payer: Self-pay | Admitting: Internal Medicine

## 2020-06-27 DIAGNOSIS — N5201 Erectile dysfunction due to arterial insufficiency: Secondary | ICD-10-CM

## 2020-06-27 MED ORDER — SILDENAFIL CITRATE 100 MG PO TABS: ORAL_TABLET | ORAL | 0 refills | Status: DC

## 2020-08-08 ENCOUNTER — Ambulatory Visit (INDEPENDENT_AMBULATORY_CARE_PROVIDER_SITE_OTHER): Payer: 59 | Admitting: Internal Medicine

## 2020-08-08 ENCOUNTER — Encounter: Payer: Self-pay | Admitting: Internal Medicine

## 2020-08-08 ENCOUNTER — Other Ambulatory Visit: Payer: Self-pay

## 2020-08-08 VITALS — BP 130/80 | HR 71 | Temp 98.1°F | Resp 17 | Ht 70.0 in | Wt 198.4 lb

## 2020-08-08 DIAGNOSIS — E782 Mixed hyperlipidemia: Secondary | ICD-10-CM

## 2020-08-08 DIAGNOSIS — R7309 Other abnormal glucose: Secondary | ICD-10-CM

## 2020-08-08 DIAGNOSIS — I10 Essential (primary) hypertension: Secondary | ICD-10-CM | POA: Diagnosis not present

## 2020-08-08 DIAGNOSIS — Z79899 Other long term (current) drug therapy: Secondary | ICD-10-CM

## 2020-08-08 DIAGNOSIS — E559 Vitamin D deficiency, unspecified: Secondary | ICD-10-CM

## 2020-08-08 DIAGNOSIS — E039 Hypothyroidism, unspecified: Secondary | ICD-10-CM

## 2020-08-08 NOTE — Patient Instructions (Signed)

## 2020-08-08 NOTE — Progress Notes (Signed)
Future Appointments  Date Time Provider Department Center  08/08/2020  2:30 PM Lucky Cowboy, MD [facility] None  02/22/2021 11:00 AM Lucky Cowboy, MD [facility] None   History of Present Illness:       This very nice 60 y.o. MWM presents for 6 month follow up with HTN, HLD, Pre-Diabetes and Vitamin D Deficiency.        Patient is treated for HTN  since 2004 & BP has been controlled at home. Today's BP is at poal 130/80. Patient has had no complaints of any cardiac type chest pain, palpitations, dyspnea / orthopnea / PND, dizziness, claudication, or dependent edema.       Hyperlipidemia is not controlled with diet . Last Lipids were not at goal:  Lab Results  Component Value Date   CHOL 201 (H) 02/06/2020   HDL 57 02/06/2020   LDLCALC 115 (H) 02/06/2020   TRIG 175 (H) 02/06/2020   CHOLHDL 3.5 02/06/2020     Also, the patient has history of PreDiabetes  A1c 5.7% /2011) and has had no symptoms of reactive hypoglycemia, diabetic polys, paresthesias or visual blurring.  Last A1c was normal & at goal:  Lab Results  Component Value Date   HGBA1C 5.4 02/06/2020                                                      Patient was dx'd with mild Hypothyroidism in May 2021  and was initiated on Thyroid Replacement.                                                        Further, the patient also has history of Vitamin D Deficiency and supplements vitamin D without any suspected side-effects. Last vitamin D was still low (goal 70-100):  Lab Results  Component Value Date   VD25OH 44 02/06/2020     Current Outpatient Medications on File Prior to Visit  Medication Sig   acyclovir (ZOVIRAX) 200 MG capsule as needed.    ALPRAZolam (XANAX) 0.5 MG tablet Take       1/2-1 tablet       at Bedtime       ONLY        if needed for Sleep &  limit to 5 days /week to avoid addiction   aspirin 81 MG tablet Take daily.    cetirizine (ZYRTEC) 10 MG tablet Take 10 mg daily.    Cholecalciferol (VITAMIN D PO) Take 5,000 Int'l Units by mouth daily.    cyanocobalamin 1000 MCG tablet Take 1,000 mcg by mouth daily.   Flaxseed, Linseed, (FLAXSEED OIL PO) Take by mouth daily.   levothyroxine 50 MCG tablet Take 1 tablet  Daily    meloxicam (MOBIC) 15 MG tablet TAKE 1/2 TO 1 TAB DAILY    Omega-3 Fatty Acids (FISH OIL PO) Take by mouth daily.   Red Yeast Rice Extract (RED YEAST RICE PO) Take by mouth daily.   sildenafil (VIAGRA) 100 MG tablet Take  1/2 to 1 tablet  Daily  if Needed for XXXX   verapamil (CALAN-SR) 240 MG CR tablet TAKE 1 TABLET DAILY WITH  FOOD FOR BP & CLUSTER HEADACHE PREVENTION     Allergies  Allergen Reactions   Topamax [Topiramate]     PMHx:   Past Medical History:  Diagnosis Date   Cluster headache    Hyperlipidemia    Hypertension    Prediabetes    Vitamin D deficiency      Immunization History  Administered Date(s) Administered   Influenza-Unspecified 11/18/2016, 11/25/2017, 11/18/2019   Janssen (J&J) SARS-COV-2 Vacc 05/17/2019   PPD Test 11/20/2016, 12/21/2017, 01/11/2019, 02/06/2020   Pneumococcal - 23 07/24/2008   Td 04/14/2006, 04/09/2008   Tdap 07/06/2018     Past Surgical History:  Procedure Laterality Date   APPENDECTOMY  1972   INCISE AND DRAIN ABCESS  2008    right shoulder   PLEURAL SCARIFICATION Left 1979   chest tube thoracostomy    FHx:    Reviewed / unchanged  SHx:    Reviewed / unchanged   Systems Review:  Constitutional: Denies fever, chills, wt changes, headaches, insomnia, fatigue, night sweats, change in appetite. Eyes: Denies redness, blurred vision, diplopia, discharge, itchy, watery eyes.  ENT: Denies discharge, congestion, post nasal drip, epistaxis, sore throat, earache, hearing loss, dental pain, tinnitus, vertigo, sinus pain, snoring.  CV: Denies chest pain, palpitations, irregular heartbeat, syncope, dyspnea, diaphoresis, orthopnea, PND, claudication or edema. Respiratory: denies cough,  dyspnea, DOE, pleurisy, hoarseness, laryngitis, wheezing.  Gastrointestinal: Denies dysphagia, odynophagia, heartburn, reflux, water brash, abdominal pain or cramps, nausea, vomiting, bloating, diarrhea, constipation, hematemesis, melena, hematochezia  or hemorrhoids. Genitourinary: Denies dysuria, frequency, urgency, nocturia, hesitancy, discharge, hematuria or flank pain. Musculoskeletal: Denies arthralgias, myalgias, stiffness, jt. swelling, pain, limping or strain/sprain.  Skin: Denies pruritus, rash, hives, warts, acne, eczema or change in skin lesion(s). Neuro: No weakness, tremor, incoordination, spasms, paresthesia or pain. Psychiatric: Denies confusion, memory loss or sensory loss. Endo: Denies change in weight, skin or hair change.  Heme/Lymph: No excessive bleeding, bruising or enlarged lymph nodes.  Physical Exam  BP 130/80   Pulse 71   Temp 98.1 F (36.7 C)   Resp 17   Ht 5\' 10"  (1.778 m)   Wt 198 lb 6.4 oz (90 kg)   SpO2 99%   BMI 28.47 kg/m   Appears  well nourished, well groomed  and in no distress.  Eyes: PERRLA, EOMs, conjunctiva no swelling or erythema. Sinuses: No frontal/maxillary tenderness ENT/Mouth: EAC's clear, TM's nl w/o erythema, bulging. Nares clear w/o erythema, swelling, exudates. Oropharynx clear without erythema or exudates. Oral hygiene is good. Tongue normal, non obstructing. Hearing intact.  Neck: Supple. Thyroid not palpable. Car 2+/2+ without bruits, nodes or JVD. Chest: Respirations nl with BS clear & equal w/o rales, rhonchi, wheezing or stridor.  Cor: Heart sounds normal w/ regular rate and rhythm without sig. murmurs, gallops, clicks or rubs. Peripheral pulses normal and equal  without edema.  Abdomen: Soft & bowel sounds normal. Non-tender w/o guarding, rebound, hernias, masses or organomegaly.  Lymphatics: Unremarkable.  Musculoskeletal: Full ROM all peripheral extremities, joint stability, 5/5 strength and normal gait.  Skin: Warm, dry  without exposed rashes, lesions or ecchymosis apparent.  Neuro: Cranial nerves intact, reflexes equal bilaterally. Sensory-motor testing grossly intact. Tendon reflexes grossly intact.  Pysch: Alert & oriented x 3.  Insight and judgement nl & appropriate. No ideations.  Assessment and Plan:  1. Essential hypertension  - Continue medication, monitor blood pressure at home.  - Continue DASH diet.  Reminder to go to the ER if any CP,  SOB, nausea, dizziness, severe HA,  changes vision/speech  - CBC with Differential/Platelet - COMPLETE METABOLIC PANEL WITH GFR - Magnesium - TSH  2. Hyperlipidemia, mixed  - Continue diet/meds, exercise,& lifestyle modifications.  - Continue monitor periodic cholesterol/liver & renal functions   - Lipid panel - TSH  3. Abnormal glucose  - Continue diet, exercise  - Lifestyle modifications.  - Monitor appropriate labs - Hemoglobin A1c - Insulin, random  4. Vitamin D deficiency  - Continue supplementation  - VITAMIN D 25 Hydroxy   5. Hypothyroidism, unspecified type  - TSH  6. Medication management  - CBC with Differential/Platelet - COMPLETE METABOLIC PANEL WITH GFR - Magnesium - Lipid panel - TSH - Hemoglobin A1c - Insulin, random - VITAMIN D 25 Hydroxy         Discussed  regular exercise, BP monitoring, weight control to achieve/maintain BMI less than 25 and discussed med and SE's. Recommended labs to assess and monitor clinical status with further disposition pending results of labs.  I discussed the assessment and treatment plan with the patient. The patient was provided an opportunity to ask questions and all were answered. The patient agreed with the plan and demonstrated an understanding of the instructions.  I provided over 30 minutes of exam, counseling, chart review and  complex critical decision making.        The patient was advised to call back or seek an in-person evaluation if the symptoms worsen or if the condition  fails to improve as anticipated.   Marinus Maw, MD

## 2020-08-09 LAB — COMPLETE METABOLIC PANEL WITH GFR
AG Ratio: 1.9 (calc) (ref 1.0–2.5)
ALT: 32 U/L (ref 9–46)
AST: 25 U/L (ref 10–35)
Albumin: 4.4 g/dL (ref 3.6–5.1)
Alkaline phosphatase (APISO): 63 U/L (ref 35–144)
BUN: 14 mg/dL (ref 7–25)
CO2: 21 mmol/L (ref 20–32)
Calcium: 9.8 mg/dL (ref 8.6–10.3)
Chloride: 104 mmol/L (ref 98–110)
Creat: 1.06 mg/dL (ref 0.70–1.25)
GFR, Est African American: 87 mL/min/{1.73_m2} (ref >=60)
GFR, Est Non African American: 75 mL/min/{1.73_m2} (ref >=60)
Globulin: 2.3 g/dL (calc) (ref 1.9–3.7)
Glucose, Bld: 83 mg/dL (ref 65–99)
Potassium: 4 mmol/L (ref 3.5–5.3)
Sodium: 135 mmol/L (ref 135–146)
Total Bilirubin: 0.9 mg/dL (ref 0.2–1.2)
Total Protein: 6.7 g/dL (ref 6.1–8.1)

## 2020-08-09 LAB — CBC WITH DIFFERENTIAL/PLATELET
Absolute Monocytes: 967 cells/uL — ABNORMAL HIGH (ref 200–950)
Basophils Absolute: 42 cells/uL (ref 0–200)
Basophils Relative: 0.8 %
Eosinophils Absolute: 78 cells/uL (ref 15–500)
Eosinophils Relative: 1.5 %
HCT: 44.1 % (ref 38.5–50.0)
Hemoglobin: 15.2 g/dL (ref 13.2–17.1)
Lymphs Abs: 1752 cells/uL (ref 850–3900)
MCH: 33.4 pg — ABNORMAL HIGH (ref 27.0–33.0)
MCHC: 34.5 g/dL (ref 32.0–36.0)
MCV: 96.9 fL (ref 80.0–100.0)
MPV: 8.4 fL (ref 7.5–12.5)
Monocytes Relative: 18.6 %
Neutro Abs: 2361 cells/uL (ref 1500–7800)
Neutrophils Relative %: 45.4 %
Platelets: 285 10*3/uL (ref 140–400)
RBC: 4.55 10*6/uL (ref 4.20–5.80)
RDW: 12.5 % (ref 11.0–15.0)
Total Lymphocyte: 33.7 %
WBC: 5.2 10*3/uL (ref 3.8–10.8)

## 2020-08-09 LAB — LIPID PANEL
Cholesterol: 172 mg/dL (ref <200)
HDL: 49 mg/dL (ref >=40)
LDL Cholesterol (Calc): 95 mg/dL (calc)
Non-HDL Cholesterol (Calc): 123 mg/dL (calc) (ref <130)
Total CHOL/HDL Ratio: 3.5 (calc) (ref <5.0)
Triglycerides: 189 mg/dL — ABNORMAL HIGH (ref <150)

## 2020-08-09 LAB — INSULIN, RANDOM: Insulin: 23.7 u[IU]/mL — ABNORMAL HIGH

## 2020-08-09 LAB — TSH: TSH: 2.99 mIU/L (ref 0.40–4.50)

## 2020-08-09 LAB — HEMOGLOBIN A1C
Hgb A1c MFr Bld: 5.5 % of total Hgb (ref <5.7)
Mean Plasma Glucose: 111 mg/dL
eAG (mmol/L): 6.2 mmol/L

## 2020-08-09 LAB — VITAMIN D 25 HYDROXY (VIT D DEFICIENCY, FRACTURES): Vit D, 25-Hydroxy: 79 ng/mL (ref 30–100)

## 2020-08-09 LAB — MAGNESIUM: Magnesium: 2.2 mg/dL (ref 1.5–2.5)

## 2020-08-09 NOTE — Progress Notes (Signed)
============================================================ -   Test results slightly outside the reference range are not unusual. If there is anything important, I will review this with you,  otherwise it is considered normal test values.  If you have further questions,  please do not hesitate to contact me at the office or via My Chart.  ============================================================ ============================================================  -  Total Chol = 172  and LDL Chol = 95   -  Both  Excellent   - Very low risk for Heart Attack  / Stroke ============================================================ ============================================================  -  Thyroid - Normal  ============================================================ ============================================================  -  A1c - Normal - great - No Diabetes ============================================================ ============================================================  -  Vitamin D = 79 - excellent  ============================================================ ============================================================  -  All Else - CBC - Kidneys - Electrolytes - Liver - Magnesium & Thyroid    - all  Normal / OK ============================================================ ============================================================  - Keep up the [place] Work  !  ============================================================ ============================================================

## 2020-08-27 ENCOUNTER — Other Ambulatory Visit: Payer: Self-pay | Admitting: Internal Medicine

## 2020-08-27 DIAGNOSIS — G44019 Episodic cluster headache, not intractable: Secondary | ICD-10-CM

## 2020-08-27 MED ORDER — VERAPAMIL HCL ER 240 MG PO TBCR: EXTENDED_RELEASE_TABLET | ORAL | 3 refills | Status: DC

## 2020-08-29 ENCOUNTER — Other Ambulatory Visit: Payer: Self-pay | Admitting: Internal Medicine

## 2020-08-29 ENCOUNTER — Other Ambulatory Visit: Payer: Self-pay

## 2020-08-29 ENCOUNTER — Ambulatory Visit: Payer: 59 | Admitting: Nurse Practitioner

## 2020-08-29 ENCOUNTER — Encounter: Payer: Self-pay | Admitting: Nurse Practitioner

## 2020-08-29 VITALS — Temp 99.0°F

## 2020-08-29 DIAGNOSIS — B342 Coronavirus infection, unspecified: Secondary | ICD-10-CM | POA: Diagnosis not present

## 2020-08-29 DIAGNOSIS — G44019 Episodic cluster headache, not intractable: Secondary | ICD-10-CM

## 2020-08-29 MED ORDER — DEXAMETHASONE 4 MG PO TABS
ORAL_TABLET | ORAL | 0 refills | Status: DC
Start: 2020-08-29 — End: 2021-02-04

## 2020-08-29 MED ORDER — VERAPAMIL HCL ER 240 MG PO TBCR: EXTENDED_RELEASE_TABLET | ORAL | 3 refills | Status: DC

## 2020-08-29 NOTE — Progress Notes (Signed)
THIS ENCOUNTER IS A VIRTUAL VISIT DUE TO COVID-19 - PATIENT WAS NOT SEEN IN THE OFFICE.  PATIENT HAS CONSENTED TO VIRTUAL VISIT / TELEMEDICINE VISIT   Virtual Visit via telephone Note  I connected with  Keith Castro on 08/29/2020 by telephone.  I verified that I am speaking with the correct person using two identifiers.    I discussed the limitations of evaluation and management by telemedicine and the availability of in person appointments. The patient expressed understanding and agreed to proceed.  History of Present Illness:  Temp 99 F (37.2 C)  59 y.o. patient contacted office reporting URI sx no cough. he tested positive by 08/29/2020. OV was conducted by telephone to minimize exposure. This patient was vaccinated for covid 19, last 05/2019 [employer] and [employer]  Sx began 1 days ago with [facility] extra strength  Treatments tried so far: 77824 units Vit D daily,Zinc and Vitamin C  Exposures: At beach last week with family and Aunt/cousin tested positive this week   Medications  Current Outpatient Medications (Endocrine & Metabolic):    levothyroxine (SYNTHROID) 50 MCG tablet, Take      1 tablet       Daily       on an empty stomach with only water for 30 minutes & no Antacid meds, Calcium or Magnesium for 4 hours & avoid Biotin  Current Outpatient Medications (Cardiovascular):    sildenafil (VIAGRA) 100 MG tablet, Take  1/2 to 1 tablet  Daily  if Needed for XXXX   verapamil (CALAN-SR) 240 MG CR tablet, Take  1 tablet  Daily  with a Meal  for BP (Dx: i10)  Current Outpatient Medications (Respiratory):    cetirizine (ZYRTEC) 10 MG tablet, Take 10 mg by mouth daily.  Current Outpatient Medications (Analgesics):    aspirin 81 MG tablet, Take 81 mg by mouth daily.    meloxicam (MOBIC) 15 MG tablet, TAKE 1/2 TO 1 TAB DAILY W/FOOD FOR PAIN & INFLAMMATION - LIMIT TO 5 TABS/WEEK TO AVOID KIDNEY DAMAGE  Current Outpatient Medications (Hematological):    cyanocobalamin 1000 MCG tablet,  Take 1,000 mcg by mouth daily.  Current Outpatient Medications (Other):    acyclovir (ZOVIRAX) 200 MG capsule, as needed.    ALPRAZolam (XANAX) 0.5 MG tablet, Take       1/2-1 tablet       at Bedtime       ONLY        if needed for Sleep &  limit to 5 days /week to avoid addiction   Cholecalciferol (VITAMIN D PO), Take 10,000 Int'l Units by mouth daily.   Flaxseed, Linseed, (FLAXSEED OIL PO), Take by mouth daily.   Omega-3 Fatty Acids (FISH OIL PO), Take by mouth daily.   Red Yeast Rice Extract (RED YEAST RICE PO), Take by mouth daily.   Zinc 50 MG TABS, Take by mouth daily.  Allergies:  Allergies  Allergen Reactions   Topamax [Topiramate]     Problem list He has Hyperlipidemia; Hypertension; Abnormal glucose; Cluster headache; Medication management; Vitamin D deficiency; Insomnia; Smoker; and Hypothyroidism on their problem list.   Social History:   reports that he has been smoking cigarettes. He started smoking about 47 years ago. He has a 34.50 pack-year smoking history. He has never used smokeless tobacco. He reports current alcohol use. He reports that he does not use drugs.  Observations/Objective:  General : Well sounding patient in no apparent distress HEENT: no hoarseness, no cough for duration of visit Lungs:  speaks in complete sentences, no audible wheezing, no apparent distress Neurological: alert, oriented x 3 Psychiatric: pleasant, judgement appropriate   Assessment and Plan:  Covid 19 Covid 19 positive per rapid screening test in home 08/29/20 Risk factors include:  Symptoms are: mild       Immue support with vitamin C15 1000 mg daily, zinc 50 mg daily, vitamin D Reviewed doing regular breathing exercises, proning Take tylenol PRN temp 101+ Push hydration Regular ambulation or calf exercises exercises for clot prevention and 81 mg ASA unless contraindicated Sx supportive therapy suggested Follow up via mychart or telephone if needed Advised patient obtain O2  monitor; present to ED if persistently <88% or with severe dyspnea, CP, fever uncontrolled by tylenol, confusion, sudden decline Should remain in isolation until at least 5 days from onset of sx, 24-48 hours fever free without tylenol, sx such as cough are improved.      Follow Up Instructions:  I discussed the assessment and treatment plan with the patient. The patient was provided an opportunity to ask questions and all were answered. The patient agreed with the plan and demonstrated an understanding of the instructions.   The patient was advised to call back or seek an in-person evaluation if the symptoms worsen or if the condition fails to improve as anticipated.  I provided 20 minutes of non-face-to-face time during this encounter.   Revonda Humphrey ANP-C  [facility]  08/29/2020

## 2021-02-02 ENCOUNTER — Other Ambulatory Visit: Payer: Self-pay | Admitting: Internal Medicine

## 2021-02-02 DIAGNOSIS — E039 Hypothyroidism, unspecified: Secondary | ICD-10-CM

## 2021-02-03 ENCOUNTER — Other Ambulatory Visit: Payer: Self-pay | Admitting: Adult Health

## 2021-02-03 DIAGNOSIS — G44019 Episodic cluster headache, not intractable: Secondary | ICD-10-CM

## 2021-02-04 ENCOUNTER — Ambulatory Visit: Payer: 59 | Admitting: Nurse Practitioner

## 2021-02-04 ENCOUNTER — Encounter: Payer: Self-pay | Admitting: Nurse Practitioner

## 2021-02-04 ENCOUNTER — Other Ambulatory Visit: Payer: Self-pay

## 2021-02-04 VITALS — HR 90 | Temp 97.7°F

## 2021-02-04 DIAGNOSIS — R6889 Other general symptoms and signs: Secondary | ICD-10-CM | POA: Diagnosis not present

## 2021-02-04 DIAGNOSIS — Z1152 Encounter for screening for COVID-19: Secondary | ICD-10-CM

## 2021-02-04 DIAGNOSIS — J111 Influenza due to unidentified influenza virus with other respiratory manifestations: Secondary | ICD-10-CM

## 2021-02-04 LAB — POCT INFLUENZA A/B
Influenza A, POC: POSITIVE — AB
Influenza B, POC: NEGATIVE

## 2021-02-04 LAB — POC COVID19 BINAXNOW: SARS Coronavirus 2 Ag: NEGATIVE

## 2021-02-04 MED ORDER — PREDNISONE 20 MG PO TABS: ORAL_TABLET | ORAL | 0 refills | Status: AC

## 2021-02-04 MED ORDER — PROMETHAZINE-DM 6.25-15 MG/5ML PO SYRP: 5.0000 mL | ORAL_SOLUTION | Freq: Four times a day (QID) | ORAL | 1 refills | Status: DC | PRN

## 2021-02-04 MED ORDER — BENZONATATE 100 MG PO CAPS: 100.0000 mg | ORAL_CAPSULE | Freq: Four times a day (QID) | ORAL | 1 refills | Status: DC | PRN

## 2021-02-04 NOTE — Progress Notes (Signed)
THIS ENCOUNTER IS A VIRTUAL VISIT DUE TO Flu- PATIENT WAS NOT SEEN IN THE OFFICE.  PATIENT HAS CONSENTED TO VIRTUAL VISIT / TELEMEDICINE VISIT   Virtual Visit via telephone Note  I connected with  Keith Castro on 02/04/2021 by telephone.  I verified that I am speaking with the correct person using two identifiers.    I discussed the limitations of evaluation and management by telemedicine and the availability of in person appointments. The patient expressed understanding and agreed to proceed.  History of Present Illness:  Pulse 90    Temp 97.7 F (36.5 C)    SpO2 98%  60 y.o. patient contacted office reporting URI sx . he tested positive by FLU test in parking lot of office. OV was conducted by telephone to minimize exposure. This patient was not vaccinated for flu this year. Immunization History  Administered Date(s) Administered   Influenza-Unspecified 11/18/2016, 11/25/2017, 11/18/2019   Janssen (J&J) SARS-COV-2 Vaccination 05/17/2019   PPD Test 09/26/2013, 09/27/2014, 10/18/2015, 11/20/2016, 12/21/2017, 01/11/2019, 02/06/2020   Pneumococcal-Unspecified 07/24/2008   Td 04/14/2006, 04/09/2008   Tdap 07/06/2018    Sx began 4 days ago with nonproductive cough,  congestion and headache  Treatments tried so far: Goody powder, advil, tylenol and robitussin  Exposures: unsure   Medications  Current Outpatient Medications (Endocrine & Metabolic):    levothyroxine (SYNTHROID) 50 MCG tablet, TAKE 1 TABLET DAILY ON AN EMPTY STOMACH WITH ONLY WATER FOR 30 MINUTES & NO ANTACID MEDS, CALCIUM OR MAGNESIUM FOR 4 HOURS & AVOID BIOTIN   dexamethasone (DECADRON) 4 MG tablet, Take 1 tab 3 x day - 3 days, then 2 x day - 3 days, then 1 tab daily  Current Outpatient Medications (Cardiovascular):    sildenafil (VIAGRA) 100 MG tablet, Take  1/2 to 1 tablet  Daily  if Needed for XXXX   verapamil (CALAN-SR) 240 MG CR tablet, Take  1 tablet  Daily  for BP & Cluster HA Prophylaxis                                             /                     TAKE 1 TABLET BY MOUTH  Current Outpatient Medications (Respiratory):    cetirizine (ZYRTEC) 10 MG tablet, Take 10 mg by mouth daily.  Current Outpatient Medications (Analgesics):    aspirin 81 MG tablet, Take 81 mg by mouth daily.    meloxicam (MOBIC) 15 MG tablet, TAKE 1/2 TO 1 TAB DAILY WITH FOOD FOR PAIN/INFLAMMATION LIMIT TO 5 TABS/WEEK TO AVOID KIDNEY DAMAGE  Current Outpatient Medications (Hematological):    cyanocobalamin 1000 MCG tablet, Take 1,000 mcg by mouth daily.  Current Outpatient Medications (Other):    acyclovir (ZOVIRAX) 200 MG capsule, as needed.    ALPRAZolam (XANAX) 0.5 MG tablet, Take       1/2-1 tablet       at Bedtime       ONLY        if needed for Sleep &  limit to 5 days /week to avoid addiction   Cholecalciferol (VITAMIN D PO), Take 10,000 Int'l Units by mouth daily.   Flaxseed, Linseed, (FLAXSEED OIL PO), Take by mouth daily.   Omega-3 Fatty Acids (FISH OIL PO), Take by mouth daily.   Red Yeast Rice Extract (RED YEAST RICE  PO), Take by mouth daily.   Zinc 50 MG TABS, Take by mouth daily.  Allergies:  Allergies  Allergen Reactions   Topamax [Topiramate]     Problem list He has Hyperlipidemia; Hypertension; Abnormal glucose; Cluster headache; Medication management; Vitamin D deficiency; Insomnia; Smoker; and Hypothyroidism on their problem list.   Social History:   reports that he has been smoking cigarettes. He started smoking about 47 years ago. He has a 34.50 pack-year smoking history. He has never used smokeless tobacco. He reports current alcohol use. He reports that he does not use drugs.  Observations/Objective:  General : Well sounding patient in no apparent distress HEENT: no hoarseness, no cough for duration of visit Lungs: speaks in complete sentences, no audible wheezing, no apparent distress Neurological: alert, oriented x 3 Psychiatric: pleasant, judgement appropriate   Assessment and  Plan: Keith Castro was seen today for acute visit.  Diagnoses and all orders for this visit:  Encounter for screening for COVID-19 -     POC COVID-19  Flu-like symptoms -     POCT Influenza A/B  Flu -     promethazine-dextromethorphan (PROMETHAZINE-DM) 6.25-15 MG/5ML syrup; Take 5 mLs by mouth 4 (four) times daily as needed for cough. -     benzonatate (TESSALON PERLES) 100 MG capsule; Take 1 capsule (100 mg total) by mouth every 6 (six) hours as needed for cough. -     predniSONE (DELTASONE) 20 MG tablet; 3 tablets daily with food for 3 days, 2 tabs daily for 3 days, 1 tab a day for 5 days.    Symptoms are: mild He is to far out to start Tamiflu Immue support reviewed  Alternate Tylenol and Advil for control of muscle aches Push hydration Sx supportive therapy suggested Follow up via mychart or telephone if needed     Follow Up Instructions:  I discussed the assessment and treatment plan with the patient. The patient was provided an opportunity to ask questions and all were answered. The patient agreed with the plan and demonstrated an understanding of the instructions.   The patient was advised to call back or seek an in-person evaluation if the symptoms worsen or if the condition fails to improve as anticipated.  I provided 20 minutes of non-face-to-face time during this encounter.   Revonda Humphrey, NP

## 2021-02-05 ENCOUNTER — Encounter: Payer: Self-pay | Admitting: Nurse Practitioner

## 2021-02-22 ENCOUNTER — Encounter: Payer: Self-pay | Admitting: Internal Medicine

## 2021-02-22 ENCOUNTER — Other Ambulatory Visit: Payer: Self-pay

## 2021-02-22 ENCOUNTER — Ambulatory Visit (INDEPENDENT_AMBULATORY_CARE_PROVIDER_SITE_OTHER): Payer: 59 | Admitting: Internal Medicine

## 2021-02-22 VITALS — BP 138/80 | HR 76 | Temp 97.9°F | Resp 16 | Ht 70.0 in | Wt 192.0 lb

## 2021-02-22 DIAGNOSIS — I10 Essential (primary) hypertension: Secondary | ICD-10-CM | POA: Diagnosis not present

## 2021-02-22 DIAGNOSIS — Z111 Encounter for screening for respiratory tuberculosis: Secondary | ICD-10-CM | POA: Diagnosis not present

## 2021-02-22 DIAGNOSIS — N138 Other obstructive and reflux uropathy: Secondary | ICD-10-CM

## 2021-02-22 DIAGNOSIS — N401 Enlarged prostate with lower urinary tract symptoms: Secondary | ICD-10-CM

## 2021-02-22 DIAGNOSIS — E559 Vitamin D deficiency, unspecified: Secondary | ICD-10-CM

## 2021-02-22 DIAGNOSIS — Z0000 Encounter for general adult medical examination without abnormal findings: Secondary | ICD-10-CM | POA: Diagnosis not present

## 2021-02-22 DIAGNOSIS — Z79899 Other long term (current) drug therapy: Secondary | ICD-10-CM

## 2021-02-22 DIAGNOSIS — Z136 Encounter for screening for cardiovascular disorders: Secondary | ICD-10-CM | POA: Diagnosis not present

## 2021-02-22 DIAGNOSIS — Z125 Encounter for screening for malignant neoplasm of prostate: Secondary | ICD-10-CM

## 2021-02-22 DIAGNOSIS — R7309 Other abnormal glucose: Secondary | ICD-10-CM

## 2021-02-22 DIAGNOSIS — Z0001 Encounter for general adult medical examination with abnormal findings: Secondary | ICD-10-CM

## 2021-02-22 DIAGNOSIS — Z1211 Encounter for screening for malignant neoplasm of colon: Secondary | ICD-10-CM

## 2021-02-22 DIAGNOSIS — E782 Mixed hyperlipidemia: Secondary | ICD-10-CM

## 2021-02-22 DIAGNOSIS — E039 Hypothyroidism, unspecified: Secondary | ICD-10-CM

## 2021-02-22 DIAGNOSIS — R5383 Other fatigue: Secondary | ICD-10-CM

## 2021-02-22 DIAGNOSIS — F17200 Nicotine dependence, unspecified, uncomplicated: Secondary | ICD-10-CM

## 2021-02-22 NOTE — Patient Instructions (Signed)

## 2021-02-22 NOTE — Progress Notes (Signed)
Annual  Screening/Preventative Visit  & Comprehensive Evaluation & Examination  Future Appointments  Date Time Provider Department  02/27/2022 10:00 AM Lucky Cowboy, MD [facility]            This very nice 61 y.o. MWM  presents for a Screening /Preventative Visit & comprehensive evaluation and management of multiple medical co-morbidities.  Patient has been followed for HTN, HLD, Prediabetes and Vitamin D Deficiency.       HTN predates since 2004. Patient's BP has been controlled at home.  Today's BP is at goal - 138/80. Patient denies any cardiac symptoms as chest pain, palpitations, shortness of breath, dizziness or ankle swelling.       Patient's hyperlipidemia is controlled with diet and medications. Patient denies myalgias or other medication SE's. Last lipids were at goal :  Lab Results  Component Value Date   CHOL 172 08/08/2020   HDL 49 08/08/2020   LDLCALC 95 08/08/2020   TRIG 189 (H) 08/08/2020   CHOLHDL 3.5 08/08/2020         Patient has hx/o prediabetes (A1c 5.7% /2011) and patient denies reactive hypoglycemic symptoms, visual blurring, diabetic polys or paresthesias. Last A1c was at goal :   Lab Results  Component Value Date   HGBA1C 5.5 08/08/2020                                            In May 2021, Patient was dx'd with mild Hypothyroidism and initiated on Thyroid Replacement.        Finally, patient has history of Vitamin D Deficiency ("33" /2011) and last vitamin D was at goal :   Lab Results  Component Value Date   VD25OH 79 08/08/2020    Current Outpatient Medications on File Prior to Visit  Medication Sig   acyclovir  200 MG capsule as needed.    ALPRAZolam 0.5 MG tablet Take  1/2-1 tablet at Bedtime ONLY if needed    aspirin 81 MG tablet Take daily.    cetirizine 10 MG tablet Take daily.   VITAMIN D 10,000 Units  Take daily.   cyanocobalamin 1000 MCG tablet Take  daily.   FLAXSEED OIL Take daily.   levothyroxine 50 MCG tablet TAKE 1  TABLET DAILY    meloxicam 15 MG tablet TAKE 1/2 TO 1 TAB DAILY    Omega-3 FISH OIL  Take  daily.   promethazine-DM 6.25-15 MG/5ML syrup Take  4  times daily as needed for cough.   Red Yeast Rice Extract  Take  daily.   sildenafil (VIAGRA) 100 MG tablet Take  1/2 to 1 tablet  Daily  if Needed    Verapamil 240 MG CR  Take  1 tablet  Daily  for BP & Cluster HA Prophylaxis     Zinc 50 MG TABS Take   daily.    Allergies  Allergen Reactions   Topamax [Topiramate]      Past Medical History:  Diagnosis Date   Cluster headache    Hyperlipidemia    Hypertension    Prediabetes    Vitamin D deficiency     Health Maintenance  Topic Date Due   Pneumococcal Vaccine 85-64 Years old (1 - PCV) [birth year]/24/1964   Zoster Vaccines- Shingrix (1 of 2) Never done   COLONOSCOPY  Never done   COVID-19 Vaccine 06/14/2019   INFLUENZA VACCINE  09/17/2020  TETANUS/TDAP  07/05/2028   Hepatitis C Screening  Completed   HIV Screening  Completed   HPV VACCINES  Aged Out     Immunization History  Administered Date(s) Administered   Influenza 11/18/2016, 11/25/2017, 11/18/2019   Janssen (J&J) SARS-COV-2 Vacc 05/17/2019   PPD Test 12/21/2017, 01/11/2019, 02/06/2020   Pneumococcal-23 07/24/2008   Td 04/14/2006, 04/09/2008   Tdap 07/06/2018    Last Colon - 2010 [facility] 10 yr f/u  - overdue - Patient prefers to establish with GI in [place]  Past Surgical History:  Procedure Laterality Date   APPENDECTOMY  1972   INCISE AND DRAIN ABCESS  2008    right shoulder   PLEURAL SCARIFICATION Left 1979   chest tube thoracostomy     Family History  Problem Relation Age of Onset   Stroke Father      Social History   Tobacco Use   Smoking status: Every Day    Packs/day: 0.75    Years: 46.00    Pack years: 34.50    Types: Cigarettes    Start date: 1975   Smokeless tobacco: Never  Substance Use Topics   Alcohol use: Yes,  occas beer   Drug use: No       ROS Constitutional: Denies fever, chills, weight loss/gain, headaches, insomnia,  night sweats or change in appetite. Does c/o fatigue. Eyes: Denies redness, blurred vision, diplopia, discharge, itchy or watery eyes.  ENT: Denies discharge, congestion, post nasal drip, epistaxis, sore throat, earache, hearing loss, dental pain, Tinnitus, Vertigo, Sinus pain or snoring.  Cardio: Denies chest pain, palpitations, irregular heartbeat, syncope, dyspnea, diaphoresis, orthopnea, PND, claudication or edema Respiratory: denies cough, dyspnea, DOE, pleurisy, hoarseness, laryngitis or wheezing.  Gastrointestinal: Denies dysphagia, heartburn, reflux, water brash, pain, cramps, nausea, vomiting, bloating, diarrhea, constipation, hematemesis, melena, hematochezia, jaundice or hemorrhoids Genitourinary: Denies dysuria, frequency, urgency, nocturia, hesitancy, discharge, hematuria or flank pain Musculoskeletal: Denies arthralgia, myalgia, stiffness, Jt. Swelling, pain, limp or strain/sprain. Denies Falls. Skin: Denies puritis, rash, hives, warts, acne, eczema or change in skin lesion Neuro: No weakness, tremor, incoordination, spasms, paresthesia or pain Psychiatric: Denies confusion, memory loss or sensory loss. Denies Depression. Endocrine: Denies change in weight, skin, hair change, nocturia, and paresthesia, diabetic polys, visual blurring or hyper / hypo glycemic episodes.  Heme/Lymph: No excessive bleeding, bruising or enlarged lymph nodes.   Physical Exam  BP 138/80    Pulse 76    Temp 97.9 F (36.6 C)    Resp 16    Ht 5\' 10"  (1.778 m)    Wt 192 lb (87.1 kg)    SpO2 96%    BMI 27.55 kg/m   General Appearance: Well nourished and well groomed and in no apparent distress.  Eyes: PERRLA, EOMs, conjunctiva no swelling or erythema, normal fundi and vessels. Sinuses: No frontal/maxillary tenderness ENT/Mouth: EACs patent / TMs  nl. Nares clear without erythema, swelling, mucoid exudates. Oral hygiene is  good. No erythema, swelling, or exudate. Tongue normal, non-obstructing. Tonsils not swollen or erythematous. Hearing normal.  Neck: Supple, thyroid not palpable. No bruits, nodes or JVD. Respiratory: Respiratory effort normal.  BS equal and clear bilateral without rales, rhonci, wheezing or stridor. Cardio: Heart sounds are normal with regular rate and rhythm and no murmurs, rubs or gallops. Peripheral pulses are normal and equal bilaterally without edema. No aortic or femoral bruits. Chest: symmetric with normal excursions and percussion.  Abdomen: Soft, with Nl bowel sounds. Nontender, no guarding, rebound, hernias, masses, or  organomegaly.  Lymphatics: Non tender without lymphadenopathy.  Musculoskeletal: Full ROM all peripheral extremities, joint stability, 5/5 strength, and normal gait. Skin: Warm and dry without rashes, lesions, cyanosis, clubbing or  ecchymosis.  Neuro: Cranial nerves intact, reflexes equal bilaterally. Normal muscle tone, no cerebellar symptoms. Sensation intact.  Pysch: Alert and oriented X 3 with normal affect, insight and judgment appropriate.   Assessment and Plan  1. Annual Preventative/Screening Exam    2. Essential hypertension  - EKG 12-Lead - [place], RETROPERITNL ABD,  LTD - CBC with Differential/Platelet - COMPLETE METABOLIC PANEL WITH GFR - Magnesium - TSH  3. Hyperlipidemia, mixed  - EKG 12-Lead - [place], RETROPERITNL ABD,  LTD - Lipid panel - TSH  4. Abnormal glucose  - EKG 12-Lead - [place], RETROPERITNL ABD,  LTD - Hemoglobin A1c - Insulin, random  5. Vitamin D deficiency  - VITAMIN D 25 Hydroxy  6. Hypothyroidism - TSH  7. BPH with obstruction/lower urinary tract symptoms  - PSA  8. Screening examination for pulmonary tuberculosis  - TB Skin Test  9. Screening for colorectal cancer  - POC Hemoccult Bld/Stl  - Ambulatory referral to Gastroenterology  10. Prostate cancer screening  - PSA  11. Screening for ischemic heart  disease  - EKG 12-Lead  12. Screening for AAA (aortic abdominal aneurysm)  - [place], RETROPERITNL ABD,  LTD  13. Smoker  - EKG 12-Lead - [place], RETROPERITNL ABD,  LTD  14. Fatigue, unspecified type  - Iron, Total/Total Iron Binding Cap - Vitamin B12 - Testosterone - CBC with Differential/Platelet - TSH  15. Medication management  - Urinalysis, Routine w reflex microscopic - COMPLETE METABOLIC PANEL WITH GFR - Magnesium - Lipid panel - TSH - Hemoglobin A1c - Insulin, random - VITAMIN D 25 Hydroxy           Patient was counseled in prudent diet, weight control to achieve/maintain BMI less than 25, BP monitoring, regular exercise and medications as discussed.  Discussed med effects and SE's. Routine screening labs and tests as requested with regular follow-up as recommended. Over 40 minutes of exam, counseling, chart review and high complex critical decision making was performed   Kirtland Bouchard, MD

## 2021-02-23 NOTE — Progress Notes (Signed)
============================================================ - Test results slightly outside the reference range are not unusual. If there is anything important, I will review this with you,  otherwise it is considered normal test values.  If you have further questions,  please do not hesitate to contact me at the office or via My Chart.  ============================================================ ============================================================  -  Iron levels are Normal & OK  ============================================================ ============================================================  -   -  Vitamin B12 =   317  is    Very Low !  (Ideal or Goal Vit B12 is between 450 - 1,100)   Low Vit B12 may be associated with Anemia , Fatigue,   Peripheral Neuropathy, Dementia, "Brain Fog", & Depression  - Recommend take a sub-lingual form of Vitamin B12 tablet   1,000 to 5,000 mcg tab that you dissolve under your tongue /Daily   - Can get [employer] - best price at [employer] or on [employer] ============================================================ ============================================================  -  PSA - very Low - Great ! ============================================================ ============================================================   - Total Chol = 198 is very high risk for Heart Attack /Stroke /Vascular Dementia                    ( Ideal or Goal is less than 180 ! )  & - Bad /Dangerous LDL Chol =121 - - >> Sitting on a time Bomb !                    ( Ideal or Goal is less than 70 ! )   - Treating with meds to lower Cholesterol is treating the result                                          & NOT treating the cause - The cause is Bad Diet !  - Read or listen to   Dr Glenard HaringMichael Gregger 's book    " How Not to Die ! "   - Recommend a stricter plant based low  cholesterol diet   - Cholesterol only comes from ANIMAL sources   - ie. meat, dairy, egg yolks, Cheese , etc   - Eat all the vegetables you want.  - Avoid meat, especially red meat - Beef AND Pork .  - Avoid cheese & dairy - milk & ice cream.    - Cheese is the most concentrated form of trans-fats which  is the worst thing to clog up our arteries.   - Veggie cheese is OK which can be found in the fresh                           produce section at [employer] or [employer] or [employer] ============================================================ ============================================================   - As a last resort,                    you can take medicines to lower cholesterol,                                                                          if you  refuse to improve your diet   ============================================================ ============================================================  A1c = 5.9%  is Now Elevated  !   Which means   Blood sugar and A1c are elevated in the borderline and                                                      early or pre-diabetes range which has the same   300% increased risk for heart attack,                                                       stroke,                                                            cancer and                                        alzheimer- type vascular dementia as full blown diabetes.   But the good news is that diet, exercise with                                                 weight loss can cure the early diabetes at this point.  It is very important that you work harder with diet by                           avoiding all foods that are white except chicken, fish & calliflower.  - Avoid white rice  (brown & wild rice is OK),   - Avoid white potatoes  (sweet potatoes in moderation is OK),   White bread or wheat bread or anything made out of   white flour like  bagels, donuts, rolls, buns, biscuits, cakes,  - pastries, cookies, pizza crust, and pasta (made from  white flour & egg whites)   - vegetarian pasta or spinach or wheat pasta is OK.  - Multigrain breads like Arnold's, Pepperidge Farm or   multigrain sandwich thins or high fiber breads like   Eureka bread or "Dave's Killer" breads that are  4 to 5 grams fiber per slice !  are best.    Diet, exercise and weight loss can reverse and cure  diabetes in the early stages.    - Diet, exercise and weight loss is very important in the   control and prevention of complications of diabetes which  affects every system in your body, ie.   -Brain - dementia/stroke,  - eyes - glaucoma/blindness,  - heart - heart attack/heart failure,  - kidneys - dialysis,  - stomach - gastric paralysis,  - intestines - malabsorption,  - nerves - severe painful neuritis,  - circulation - gangrene & loss  of a leg(s)  - and finally  . . . . . . . . . . . . . . . . . .   - cancer and Alzheimers.  =============================================================  -  Vitamin D = 55 has dropped down from 20 ]                      - Vitamin D goal is between 70-100.   - Please make sure that you are taking your Vitamin D as directed.   - It is very important as a natural anti-inflammatory and helping the  immune system protect against viral infections, like the Covid-19    helping hair, skin, and nails, as well as reducing stroke and  heart attack risk.   - It helps your bones and helps with mood.  - It also decreases numerous cancer risks so please  take it as directed.   - Low Vit D is associated with a 200-300% higher risk for  CANCER   and 200-300% higher risk for HEART   ATTACK  &  STROKE.    - It is also associated with higher death rate at younger ages,   autoimmune diseases like Rheumatoid arthritis, Lupus,  Multiple Sclerosis.     - Also many other serious conditions, like depression,  Alzheimer's  Dementia, infertility, muscle aches, fatigue, fibromyalgia   - just to name a few. ============================================================ ============================================================  -  All Else - CBC - Kidneys - Electrolytes - Liver - Magnesium & Thyroid    - all  Normal / OK ============================================================ ============================================================

## 2021-02-25 LAB — CBC WITH DIFFERENTIAL/PLATELET
Absolute Monocytes: 742 cells/uL (ref 200–950)
Basophils Absolute: 29 cells/uL (ref 0–200)
Basophils Relative: 0.5 %
Eosinophils Absolute: 52 cells/uL (ref 15–500)
Eosinophils Relative: 0.9 %
HCT: 42.9 % (ref 38.5–50.0)
Hemoglobin: 14.9 g/dL (ref 13.2–17.1)
Lymphs Abs: 1537 cells/uL (ref 850–3900)
MCH: 33.4 pg — ABNORMAL HIGH (ref 27.0–33.0)
MCHC: 34.7 g/dL (ref 32.0–36.0)
MCV: 96.2 fL (ref 80.0–100.0)
MPV: 8.6 fL (ref 7.5–12.5)
Monocytes Relative: 12.8 %
Neutro Abs: 3439 cells/uL (ref 1500–7800)
Neutrophils Relative %: 59.3 %
Platelets: 420 10*3/uL — ABNORMAL HIGH (ref 140–400)
RBC: 4.46 10*6/uL (ref 4.20–5.80)
RDW: 12.1 % (ref 11.0–15.0)
Total Lymphocyte: 26.5 %
WBC: 5.8 10*3/uL (ref 3.8–10.8)

## 2021-02-25 LAB — LIPID PANEL
Cholesterol: 198 mg/dL (ref <200)
HDL: 47 mg/dL (ref >=40)
LDL Cholesterol (Calc): 121 mg/dL (calc) — ABNORMAL HIGH
Non-HDL Cholesterol (Calc): 151 mg/dL (calc) — ABNORMAL HIGH (ref <130)
Total CHOL/HDL Ratio: 4.2 (calc) (ref <5.0)
Triglycerides: 182 mg/dL — ABNORMAL HIGH (ref <150)

## 2021-02-25 LAB — URINALYSIS, ROUTINE W REFLEX MICROSCOPIC
Bilirubin Urine: NEGATIVE
Glucose, UA: NEGATIVE
Hgb urine dipstick: NEGATIVE
Ketones, ur: NEGATIVE
Leukocytes,Ua: NEGATIVE
Nitrite: NEGATIVE
Protein, ur: NEGATIVE
Specific Gravity, Urine: 1.014 (ref 1.001–1.035)
pH: 6.5 (ref 5.0–8.0)

## 2021-02-25 LAB — COMPLETE METABOLIC PANEL WITH GFR
AG Ratio: 1.9 (calc) (ref 1.0–2.5)
ALT: 28 U/L (ref 9–46)
AST: 20 U/L (ref 10–35)
Albumin: 4.6 g/dL (ref 3.6–5.1)
Alkaline phosphatase (APISO): 67 U/L (ref 35–144)
BUN: 11 mg/dL (ref 7–25)
CO2: 24 mmol/L (ref 20–32)
Calcium: 9.8 mg/dL (ref 8.6–10.3)
Chloride: 104 mmol/L (ref 98–110)
Creat: 0.85 mg/dL (ref 0.70–1.35)
Globulin: 2.4 g/dL (calc) (ref 1.9–3.7)
Glucose, Bld: 85 mg/dL (ref 65–99)
Potassium: 4.9 mmol/L (ref 3.5–5.3)
Sodium: 137 mmol/L (ref 135–146)
Total Bilirubin: 0.6 mg/dL (ref 0.2–1.2)
Total Protein: 7 g/dL (ref 6.1–8.1)
eGFR: 98 mL/min/{1.73_m2} (ref >=60)

## 2021-02-25 LAB — IRON, TOTAL/TOTAL IRON BINDING CAP
%SAT: 29 % (calc) (ref 20–48)
Iron: 119 ug/dL (ref 50–180)
TIBC: 404 mcg/dL (calc) (ref 250–425)

## 2021-02-25 LAB — HEMOGLOBIN A1C
Hgb A1c MFr Bld: 5.9 % of total Hgb — ABNORMAL HIGH (ref <5.7)
Mean Plasma Glucose: 123 mg/dL
eAG (mmol/L): 6.8 mmol/L

## 2021-02-25 LAB — INSULIN, RANDOM: Insulin: 8.7 u[IU]/mL

## 2021-02-25 LAB — MAGNESIUM: Magnesium: 2.4 mg/dL (ref 1.5–2.5)

## 2021-02-25 LAB — VITAMIN D 25 HYDROXY (VIT D DEFICIENCY, FRACTURES): Vit D, 25-Hydroxy: 55 ng/mL (ref 30–100)

## 2021-02-25 LAB — PSA: PSA: 0.38 ng/mL (ref <=4.00)

## 2021-02-25 LAB — VITAMIN B12: Vitamin B-12: 317 pg/mL (ref 200–1100)

## 2021-02-25 LAB — TESTOSTERONE: Testosterone: 547 ng/dL (ref 250–827)

## 2021-02-25 LAB — TSH: TSH: 2.59 mIU/L (ref 0.40–4.50)

## 2021-03-25 ENCOUNTER — Other Ambulatory Visit: Payer: Self-pay | Admitting: Internal Medicine

## 2021-03-25 DIAGNOSIS — N5201 Erectile dysfunction due to arterial insufficiency: Secondary | ICD-10-CM

## 2021-03-25 MED ORDER — SILDENAFIL CITRATE 100 MG PO TABS: ORAL_TABLET | ORAL | 1 refills | Status: DC

## 2021-04-02 ENCOUNTER — Other Ambulatory Visit: Payer: Self-pay | Admitting: Internal Medicine

## 2021-04-02 ENCOUNTER — Encounter: Payer: Self-pay | Admitting: Internal Medicine

## 2021-04-02 DIAGNOSIS — N5201 Erectile dysfunction due to arterial insufficiency: Secondary | ICD-10-CM

## 2021-04-02 MED ORDER — SILDENAFIL CITRATE 100 MG PO TABS: ORAL_TABLET | ORAL | 1 refills | Status: DC

## 2021-08-08 ENCOUNTER — Encounter: Payer: Self-pay | Admitting: Internal Medicine

## 2021-08-08 ENCOUNTER — Other Ambulatory Visit: Payer: Self-pay | Admitting: Internal Medicine

## 2021-08-08 DIAGNOSIS — G44021 Chronic cluster headache, intractable: Secondary | ICD-10-CM

## 2021-08-08 MED ORDER — RIZATRIPTAN BENZOATE 10 MG PO TBDP: ORAL_TABLET | ORAL | 0 refills | Status: DC

## 2021-08-30 ENCOUNTER — Other Ambulatory Visit: Payer: Self-pay | Admitting: Nurse Practitioner

## 2021-08-30 DIAGNOSIS — E039 Hypothyroidism, unspecified: Secondary | ICD-10-CM

## 2021-09-03 ENCOUNTER — Encounter: Payer: Self-pay | Admitting: Internal Medicine

## 2021-09-03 ENCOUNTER — Ambulatory Visit (INDEPENDENT_AMBULATORY_CARE_PROVIDER_SITE_OTHER): Payer: 59 | Admitting: Internal Medicine

## 2021-09-03 VITALS — BP 170/92 | HR 67 | Temp 97.9°F | Ht 70.0 in | Wt 190.6 lb

## 2021-09-03 DIAGNOSIS — I10 Essential (primary) hypertension: Secondary | ICD-10-CM

## 2021-09-03 DIAGNOSIS — E559 Vitamin D deficiency, unspecified: Secondary | ICD-10-CM

## 2021-09-03 DIAGNOSIS — E782 Mixed hyperlipidemia: Secondary | ICD-10-CM

## 2021-09-03 DIAGNOSIS — E039 Hypothyroidism, unspecified: Secondary | ICD-10-CM

## 2021-09-03 DIAGNOSIS — R7309 Other abnormal glucose: Secondary | ICD-10-CM | POA: Diagnosis not present

## 2021-09-03 DIAGNOSIS — Z79899 Other long term (current) drug therapy: Secondary | ICD-10-CM

## 2021-09-03 NOTE — Patient Instructions (Signed)

## 2021-09-03 NOTE — Progress Notes (Signed)
Future Appointments  Date Time Provider Department  09/03/2021  4:00 PM Lucky Cowboy, MD [facility]  02/27/2022 10:00 AM Lucky Cowboy, MD [facility]    History of Present Illness:       This very nice 61 y.o. MWM presents for 6 month follow up with HTN, HLD, Pre-Diabetes and Vitamin D Deficiency.        Patient is treated for HTN  since 2004 & BP has been controlled at home. Today's BP is elevated at 170/92 & rechecked at 157/100 and rechecked again at 150/97.  Patient has had no complaints of any cardiac type chest pain, palpitations, dyspnea / orthopnea / PND, dizziness, claudication, or dependent edema.       Hyperlipidemia is not controlled with diet . Last Lipids were not at goal :  Lab Results  Component Value Date   CHOL 198 02/22/2021   HDL 47 02/22/2021   LDLCALC 121 (H) 02/22/2021   TRIG 182 (H) 02/22/2021   CHOLHDL 4.2 02/22/2021     Also, the patient has history of PreDiabetes  A1c 5.7% /2011) and has had no symptoms of reactive hypoglycemia, diabetic polys, paresthesias or visual blurring.  Last A1c was not at goal :  Lab Results  Component Value Date   HGBA1C 5.9 (H) 02/22/2021                                                    Patient was dx'd with mild Hypothyroidism in May 2021  and was initiated on Thyroid Replacement.                                                        Further, the patient also has history of Vitamin D Deficiency and supplements vitamin D without any suspected side-effects. Last vitamin D was still low (goal 70-100):  Lab Results  Component Value Date   VD25OH 55 02/22/2021       Current Outpatient Medications on File Prior to Visit  Medication Sig   acyclovir (ZOVIRAX) 200 MG capsule as needed.    ALPRAZolam (XANAX) 0.5 MG tablet Take       1/2-1 tablet       at Bedtime       ONLY        if needed for Sleep &  limit to 5 days /week to avoid addiction   aspirin 81 MG tablet Take daily.    cetirizine (ZYRTEC) 10 MG  tablet Take 10 mg daily.   Cholecalciferol (VITAMIN D PO) Take 5,000 Int'l Units by mouth daily.    cyanocobalamin 1000 MCG tablet Take 1,000 mcg by mouth daily.   Flaxseed, Linseed, (FLAXSEED OIL PO) Take by mouth daily.   levothyroxine 50 MCG tablet Take 1 tablet  Daily    meloxicam (MOBIC) 15 MG tablet TAKE 1/2 TO 1 TAB DAILY    Omega-3 Fatty Acids (FISH OIL PO) Take by mouth daily.   Red Yeast Rice Extract (RED YEAST RICE PO) Take by mouth daily.   sildenafil (VIAGRA) 100 MG tablet Take  1/2 to 1 tablet  Daily  if Needed for XXXX   verapamil (CALAN-SR) 240 MG CR tablet  TAKE 1 TABLET DAILY WITH FOOD FOR BP & CLUSTER HEADACHE PREVENTION     Allergies  Allergen Reactions   Topamax [Topiramate]     PMHx:   Past Medical History:  Diagnosis Date   Cluster headache    Hyperlipidemia    Hypertension    Prediabetes    Vitamin D deficiency      Immunization History  Administered Date(s) Administered   Influenza-Unspecified 11/18/2016, 11/25/2017, 11/18/2019   Janssen (J&J) SARS-COV-2 Vacc 05/17/2019   PPD Test 11/20/2016, 12/21/2017, 01/11/2019, 02/06/2020   Pneumococcal - 23 07/24/2008   Td 04/14/2006, 04/09/2008   Tdap 07/06/2018     Past Surgical History:  Procedure Laterality Date   APPENDECTOMY  1972   INCISE AND DRAIN ABCESS  2008    right shoulder   PLEURAL SCARIFICATION Left 1979   chest tube thoracostomy    FHx:    Reviewed / unchanged  SHx:    Reviewed / unchanged   Systems Review:  Constitutional: Denies fever, chills, wt changes, headaches, insomnia, fatigue, night sweats, change in appetite. Eyes: Denies redness, blurred vision, diplopia, discharge, itchy, watery eyes.  ENT: Denies discharge, congestion, post nasal drip, epistaxis, sore throat, earache, hearing loss, dental pain, tinnitus, vertigo, sinus pain, snoring.  CV: Denies chest pain, palpitations, irregular heartbeat, syncope, dyspnea, diaphoresis, orthopnea, PND, claudication or  edema. Respiratory: denies cough, dyspnea, DOE, pleurisy, hoarseness, laryngitis, wheezing.  Gastrointestinal: Denies dysphagia, odynophagia, heartburn, reflux, water brash, abdominal pain or cramps, nausea, vomiting, bloating, diarrhea, constipation, hematemesis, melena, hematochezia  or hemorrhoids. Genitourinary: Denies dysuria, frequency, urgency, nocturia, hesitancy, discharge, hematuria or flank pain. Musculoskeletal: Denies arthralgias, myalgias, stiffness, jt. swelling, pain, limping or strain/sprain.  Skin: Denies pruritus, rash, hives, warts, acne, eczema or change in skin lesion(s). Neuro: No weakness, tremor, incoordination, spasms, paresthesia or pain. Psychiatric: Denies confusion, memory loss or sensory loss. Endo: Denies change in weight, skin or hair change.  Heme/Lymph: No excessive bleeding, bruising or enlarged lymph nodes.  Physical Exam  BP (!) 170/92   Pulse 67   Temp 97.9 F (36.6 C)   Ht 5\' 10"  (1.778 m)   Wt 190 lb 9.6 oz (86.5 kg)   SpO2 98%   BMI 27.35 kg/m   Appears  well nourished, well groomed  and in no distress.  Eyes: PERRLA, EOMs, conjunctiva no swelling or erythema. Sinuses: No frontal/maxillary tenderness ENT/Mouth: EAC's clear, TM's nl w/o erythema, bulging. Nares clear w/o erythema, swelling, exudates. Oropharynx clear without erythema or exudates. Oral hygiene is good. Tongue normal, non obstructing. Hearing intact.  Neck: Supple. Thyroid not palpable. Car 2+/2+ without bruits, nodes or JVD. Chest: Respirations nl with BS clear & equal w/o rales, rhonchi, wheezing or stridor.  Cor: Heart sounds normal w/ regular rate and rhythm without sig. murmurs, gallops, clicks or rubs. Peripheral pulses normal and equal  without edema.  Abdomen: Soft & bowel sounds normal. Non-tender w/o guarding, rebound, hernias, masses or organomegaly.  Lymphatics: Unremarkable.  Musculoskeletal: Full ROM all peripheral extremities, joint stability, 5/5 strength and  normal gait.  Skin: Warm, dry without exposed rashes, lesions or ecchymosis apparent.  Neuro: Cranial nerves intact, reflexes equal bilaterally. Sensory-motor testing grossly intact. Tendon reflexes grossly intact.  Pysch: Alert & oriented x 3.  Insight and judgement nl & appropriate. No ideations.  Assessment and Plan:   1. Essential hypertension  - labile elevated  BP   -  ? White Coat HTN  - CBC with Differential/Platelet - COMPLETE METABOLIC PANEL WITH GFR - Magnesium -  TSH  2. Hyperlipidemia, mixed  - Lipid panel - TSH  3. Abnormal glucose  - Hemoglobin A1c - Insulin, random  4. Vitamin D deficiency  - VITAMIN D 25 Hydroxy   5. Hypothyroidism  - TSH  6. Medication management  - CBC with Differential/Platelet - COMPLETE METABOLIC PANEL WITH GFR - Magnesium - Lipid panel - TSH - Hemoglobin A1c - Insulin, random - VITAMIN D 25 Hydroxy           Discussed  regular exercise, BP monitoring 2 x /day & call if remains elevated  >145/90 .                                                                                                                                                                                                                                                                             Discussed weight control to achieve/maintain BMI less than 25 and discussed med and SE's. Recommended labs to assess and monitor clinical status with further disposition pending results of labs.  I discussed the assessment and treatment plan with the patient. The patient was provided an opportunity to ask questions and all were answered. The patient agreed with the plan and demonstrated an understanding of the instructions.  I provided  over 30 minutes of exam, counseling, chart review and  complex critical decision making.        The patient was advised to call back or seek an in-person evaluation if the symptoms worsen or if the condition fails to improve as  anticipated.   Marinus Maw, MD

## 2021-09-04 ENCOUNTER — Other Ambulatory Visit: Payer: Self-pay | Admitting: Internal Medicine

## 2021-09-04 DIAGNOSIS — E039 Hypothyroidism, unspecified: Secondary | ICD-10-CM

## 2021-09-04 LAB — CBC WITH DIFFERENTIAL/PLATELET
Absolute Monocytes: 827 cells/uL (ref 200–950)
Basophils Absolute: 32 cells/uL (ref 0–200)
Basophils Relative: 0.6 %
Eosinophils Absolute: 69 cells/uL (ref 15–500)
Eosinophils Relative: 1.3 %
HCT: 44.1 % (ref 38.5–50.0)
Hemoglobin: 15.4 g/dL (ref 13.2–17.1)
Lymphs Abs: 1389 cells/uL (ref 850–3900)
MCH: 34.5 pg — ABNORMAL HIGH (ref 27.0–33.0)
MCHC: 34.9 g/dL (ref 32.0–36.0)
MCV: 98.7 fL (ref 80.0–100.0)
MPV: 8.9 fL (ref 7.5–12.5)
Monocytes Relative: 15.6 %
Neutro Abs: 2984 cells/uL (ref 1500–7800)
Neutrophils Relative %: 56.3 %
Platelets: 275 10*3/uL (ref 140–400)
RBC: 4.47 10*6/uL (ref 4.20–5.80)
RDW: 12.9 % (ref 11.0–15.0)
Total Lymphocyte: 26.2 %
WBC: 5.3 10*3/uL (ref 3.8–10.8)

## 2021-09-04 LAB — HEMOGLOBIN A1C
Hgb A1c MFr Bld: 5.1 % of total Hgb (ref <5.7)
Mean Plasma Glucose: 100 mg/dL
eAG (mmol/L): 5.5 mmol/L

## 2021-09-04 LAB — COMPLETE METABOLIC PANEL WITH GFR
AG Ratio: 2 (calc) (ref 1.0–2.5)
ALT: 26 U/L (ref 9–46)
AST: 26 U/L (ref 10–35)
Albumin: 4.4 g/dL (ref 3.6–5.1)
Alkaline phosphatase (APISO): 72 U/L (ref 35–144)
BUN: 13 mg/dL (ref 7–25)
CO2: 25 mmol/L (ref 20–32)
Calcium: 10 mg/dL (ref 8.6–10.3)
Chloride: 103 mmol/L (ref 98–110)
Creat: 0.9 mg/dL (ref 0.70–1.35)
Globulin: 2.2 g/dL (calc) (ref 1.9–3.7)
Glucose, Bld: 74 mg/dL (ref 65–99)
Potassium: 4.4 mmol/L (ref 3.5–5.3)
Sodium: 136 mmol/L (ref 135–146)
Total Bilirubin: 0.7 mg/dL (ref 0.2–1.2)
Total Protein: 6.6 g/dL (ref 6.1–8.1)
eGFR: 96 mL/min/{1.73_m2} (ref >=60)

## 2021-09-04 LAB — LIPID PANEL
Cholesterol: 155 mg/dL (ref <200)
HDL: 64 mg/dL (ref >=40)
LDL Cholesterol (Calc): 72 mg/dL (calc)
Non-HDL Cholesterol (Calc): 91 mg/dL (calc) (ref <130)
Total CHOL/HDL Ratio: 2.4 (calc) (ref <5.0)
Triglycerides: 108 mg/dL (ref <150)

## 2021-09-04 LAB — TSH: TSH: 5.68 mIU/L — ABNORMAL HIGH (ref 0.40–4.50)

## 2021-09-04 LAB — INSULIN, RANDOM: Insulin: 9.7 u[IU]/mL

## 2021-09-04 LAB — MAGNESIUM: Magnesium: 2 mg/dL (ref 1.5–2.5)

## 2021-09-04 LAB — VITAMIN D 25 HYDROXY (VIT D DEFICIENCY, FRACTURES): Vit D, 25-Hydroxy: 80 ng/mL (ref 30–100)

## 2021-09-04 MED ORDER — LEVOTHYROXINE SODIUM 75 MCG PO TABS: ORAL_TABLET | ORAL | 3 refills | Status: DC

## 2021-09-04 NOTE — Progress Notes (Signed)
<><><><><><><><><><><><><><><><><><><><><><><><><><><><><><><><><> <><><><><><><><><><><><><><><><><><><><><><><><><><><><><><><><><> -   Test results slightly outside the reference range are not unusual. If there is anything important, I will review this with you,  otherwise it is considered normal test values.  If you have further questions,  please do not hesitate to contact me at the office or via My Chart.  <><><><><><><><><><><><><><><><><><><><><><><><><><><><><><><><><> <><><><><><><><><><><><><><><><><><><><><><><><><><><><><><><><><>  -  TSH is elevated which means that thyroid hormone level is low in blood.   So, sent in new Rx for a slightly higher  dose to [facility]  - Please call office to schedule a   Nurse  / Lab visit  between Aug 14th   to Aug   25th to recheck TSH level <><><><><><><><><><><><><><><><><><><><><><><><><><><><><><><><><> <><><><><><><><><><><><><><><><><><><><><><><><><><><><><><><><><>  -  Total Chol = 155  &  LDL Chol = 72   - Both  Excellent  & much better than last time   - Very low risk for Heart Attack  / Stroke <><><><><><><><><><><><><><><><><><><><><><><><><><><><><><><><><> <><><><><><><><><><><><><><><><><><><><><><><><><><><><><><><><><>  -  A1c is back to Normal - Great - No Diabetes  ! <><><><><><><><><><><><><><><><><><><><><><><><><><><><><><><><><> <><><><><><><><><><><><><><><><><><><><><><><><><><><><><><><><><>  -  Vitamin D = 80  - Excellent - Please keep dose same <><><><><><><><><><><><><><><><><><><><><><><><><><><><><><><><><> <><><><><><><><><><><><><><><><><><><><><><><><><><><><><><><><><>  -  All Else - CBC - Kidneys - Electrolytes - Liver - Magnesium & Thyroid    - all  Normal / OK <><><><><><><><><><><><><><><><><><><><><><><><><><><><><><><><><> <><><><><><><><><><><><><><><><><><><><><><><><><><><><><><><><><>  -  Keep up the [place] work   !  <><><><><><><><><><><><><><><><><><><><><><><><><><><><><><><><><> <><><><><><><><><><><><><><><><><><><><><><><><><><><><><><><><><>

## 2022-02-10 ENCOUNTER — Other Ambulatory Visit: Payer: Self-pay | Admitting: Internal Medicine

## 2022-02-10 DIAGNOSIS — G44019 Episodic cluster headache, not intractable: Secondary | ICD-10-CM

## 2022-02-27 ENCOUNTER — Encounter: Payer: 59 | Admitting: Internal Medicine

## 2022-02-27 ENCOUNTER — Encounter: Payer: Self-pay | Admitting: Internal Medicine

## 2022-02-27 ENCOUNTER — Ambulatory Visit (INDEPENDENT_AMBULATORY_CARE_PROVIDER_SITE_OTHER): Payer: 59 | Admitting: Internal Medicine

## 2022-02-27 VITALS — BP 136/68 | HR 71 | Temp 98.0°F | Resp 16 | Ht 70.0 in | Wt 192.2 lb

## 2022-02-27 DIAGNOSIS — N138 Other obstructive and reflux uropathy: Secondary | ICD-10-CM

## 2022-02-27 DIAGNOSIS — Z0000 Encounter for general adult medical examination without abnormal findings: Secondary | ICD-10-CM | POA: Diagnosis not present

## 2022-02-27 DIAGNOSIS — Z136 Encounter for screening for cardiovascular disorders: Secondary | ICD-10-CM

## 2022-02-27 DIAGNOSIS — Z111 Encounter for screening for respiratory tuberculosis: Secondary | ICD-10-CM

## 2022-02-27 DIAGNOSIS — F17200 Nicotine dependence, unspecified, uncomplicated: Secondary | ICD-10-CM

## 2022-02-27 DIAGNOSIS — E782 Mixed hyperlipidemia: Secondary | ICD-10-CM

## 2022-02-27 DIAGNOSIS — I10 Essential (primary) hypertension: Secondary | ICD-10-CM

## 2022-02-27 DIAGNOSIS — R5383 Other fatigue: Secondary | ICD-10-CM

## 2022-02-27 DIAGNOSIS — G44019 Episodic cluster headache, not intractable: Secondary | ICD-10-CM

## 2022-02-27 DIAGNOSIS — Z0001 Encounter for general adult medical examination with abnormal findings: Secondary | ICD-10-CM

## 2022-02-27 DIAGNOSIS — Z1211 Encounter for screening for malignant neoplasm of colon: Secondary | ICD-10-CM

## 2022-02-27 DIAGNOSIS — Z79899 Other long term (current) drug therapy: Secondary | ICD-10-CM

## 2022-02-27 DIAGNOSIS — R7309 Other abnormal glucose: Secondary | ICD-10-CM

## 2022-02-27 DIAGNOSIS — E559 Vitamin D deficiency, unspecified: Secondary | ICD-10-CM

## 2022-02-27 DIAGNOSIS — E039 Hypothyroidism, unspecified: Secondary | ICD-10-CM

## 2022-02-27 DIAGNOSIS — Z125 Encounter for screening for malignant neoplasm of prostate: Secondary | ICD-10-CM

## 2022-02-27 NOTE — Patient Instructions (Signed)

## 2022-02-27 NOTE — Progress Notes (Signed)
Annual  Screening/Preventative Visit  & Comprehensive Evaluation & Examination   Future Appointments  Date Time Provider Department  03/02/2023  2:00 PM Unk Pinto, MD [facility]             This very nice 62 y.o. MWM  presents for a Screening /Preventative Visit & comprehensive evaluation and management of multiple medical co-morbidities.  Patient has been followed for HTN, HLD, Prediabetes, Hypothyroidism  and Vitamin D Deficiency.       HTN predates since 2004. Patient's BP has been controlled at home.  Today's BP wasinitialy elevated & rechecked at goal - 136/68 - . Patient denies any cardiac symptoms as chest pain, palpitations, shortness of breath, dizziness or ankle swelling.       Patient's hyperlipidemia is controlled with diet and medications. Patient denies myalgias or other medication SE's. Last lipids were at goal :  Lab Results  Component Value Date   CHOL 155 09/03/2021   HDL 64 09/03/2021   LDLCALC 72 09/03/2021   TRIG 108 09/03/2021   CHOLHDL 2.4 09/03/2021         Patient has hx/o prediabetes (A1c 5.7% /2011) and patient denies reactive hypoglycemic symptoms, visual blurring, diabetic polys or paresthesias. Last A1c was at goal :   Lab Results  Component Value Date   HGBA1C 5.1 09/03/2021                                            In May 2021, Patient was dx'd with mild Hypothyroidism and initiated on Thyroid Replacement.        Finally, patient has history of Vitamin D Deficiency ("33" /2011) and last vitamin D was at goal :   Lab Results  Component Value Date   VD25OH 80 09/03/2021      Current Outpatient Medications:     acyclovir (ZOVIRAX) 200 MG capsule, as needed. , Disp: , Rfl:    ALPRAZolam (XANAX) 0.5 MG tablet, Take       1/2-1 tablet       at Bedtime       ONLY        if needed for Sleep &  limit to 5 days /week to avoid addiction, Disp: 90 tablet, Rfl: 0   aspirin 81 MG tablet, Take 81 mg by mouth daily. , Disp: , Rfl:     cetirizine (ZYRTEC) 10 MG tablet, Take 10 mg by mouth daily., Disp: , Rfl:    Cholecalciferol (VITAMIN D PO), Take 10,000 Int'l Units by mouth daily., Disp: , Rfl:    cyanocobalamin 1000 MCG tablet, Take 1,000 mcg by mouth daily., Disp: , Rfl:    Flaxseed, Linseed, (FLAXSEED OIL PO), Take by mouth daily., Disp: , Rfl:    levothyroxine  75 MCG tablet, Take  1 tablet  Daily  on an empty stomach with only water for 30 minutes & no Antacid meds, Calcium or Magnesium for 4 hours & avoid Biotin, Disp: 90 tablet, Rfl: 3   meloxicam (MOBIC) 15 MG tablet, TAKE 1/2 TO 1 TAB DAILY WITH FOOD FOR PAIN/INFLAMMATION LIMIT TO 5 TABS/WEEK TO AVOID KIDNEY DAMAGE, Disp: 90 tablet, Rfl: 3   Omega-3 Fatty Acids (FISH OIL PO), Take by mouth daily., Disp: , Rfl:    Red Yeast Rice Extract (RED YEAST RICE PO), Take by mouth daily., Disp: , Rfl:    rizatriptan (MAXALT-MLT)  10 MG disintegrating tablet, Take 1 tablet Immediately for Migraine & May repeat in 2 hours if needed ( Maximum 2 tablets /24 hours ), Disp: 27 tablet, Rfl: 0   sildenafil (VIAGRA) 100 MG tablet, Take  1/2 to 1 tablet  Daily  if Needed for XXXX, Disp: 30 tablet, Rfl: 1   verapamil (CALAN-SR) 240 MG CR tablet, TAKE 1 TABLET DAILY FOR BP & CLUSTER HA PROPHYLAXIS, Disp: 90 tablet, Rfl: 3   Zinc 50 MG TABS, Take by mouth daily., Disp: , Rfl:     Allergies  Allergen Reactions   Topamax [Topiramate]      Past Medical History:  Diagnosis Date   Cluster headache    Hyperlipidemia    Hypertension    Prediabetes    Vitamin D deficiency     Health Maintenance  Topic Date Due   Pneumococcal Vaccine 55-99 Years old (1 - PCV) 04/24/1966   Zoster Vaccines- Shingrix (1 of 2) Never done   COLONOSCOPY  Never done   COVID-19 Vaccine 06/14/2019   INFLUENZA VACCINE  09/17/2020   TETANUS/TDAP  07/05/2028   Hepatitis C Screening  Completed   HIV Screening  Completed   HPV VACCINES  Aged Out     Immunization History  Administered Date(s) Administered    Influenza 11/18/2016, 11/25/2017, 11/18/2019   Janssen (J&J) SARS-COV-2 Vacc 05/17/2019   PPD Test 12/21/2017, 01/11/2019, 02/06/2020   Pneumococcal-23 07/24/2008   Td 04/14/2006, 04/09/2008   Tdap 07/06/2018    Last Colon - 2010 [place] 10 yr f/u  - overdue - Patient prefers to establish with GI in [place]  Past Surgical History:  Procedure Laterality Date   APPENDECTOMY  1972   INCISE AND DRAIN ABCESS  2008    right shoulder   PLEURAL SCARIFICATION Left 1979   chest tube thoracostomy     Family History  Problem Relation Age of Onset   Stroke Father      Social History   Tobacco Use   Smoking status: Every Day    Packs/day: 0.75    Years: 46.00    Pack years: 34.50    Types: Cigarettes    Start date: 1975   Smokeless tobacco: Never  Substance Use Topics   Alcohol use: Yes,  occas beer   Drug use: No      ROS Constitutional: Denies fever, chills, weight loss/gain, headaches, insomnia,  night sweats or change in appetite. Does c/o fatigue. Eyes: Denies redness, blurred vision, diplopia, discharge, itchy or watery eyes.  ENT: Denies discharge, congestion, post nasal drip, epistaxis, sore throat, earache, hearing loss, dental pain, Tinnitus, Vertigo, Sinus pain or snoring.  Cardio: Denies chest pain, palpitations, irregular heartbeat, syncope, dyspnea, diaphoresis, orthopnea, PND, claudication or edema Respiratory: denies cough, dyspnea, DOE, pleurisy, hoarseness, laryngitis or wheezing.  Gastrointestinal: Denies dysphagia, heartburn, reflux, water brash, pain, cramps, nausea, vomiting, bloating, diarrhea, constipation, hematemesis, melena, hematochezia, jaundice or hemorrhoids Genitourinary: Denies dysuria, frequency, urgency, nocturia, hesitancy, discharge, hematuria or flank pain Musculoskeletal: Denies arthralgia, myalgia, stiffness, Jt. Swelling, pain, limp or strain/sprain. Denies Falls. Skin: Denies puritis, rash, hives,  warts, acne, eczema or change in skin lesion Neuro: No weakness, tremor, incoordination, spasms, paresthesia or pain Psychiatric: Denies confusion, memory loss or sensory loss. Denies Depression. Endocrine: Denies change in weight, skin, hair change, nocturia, and paresthesia, diabetic polys, visual blurring or hyper / hypo glycemic episodes.  Heme/Lymph: No excessive bleeding, bruising or enlarged lymph nodes.   Physical Exam  BP 136/68  Pulse 71   Temp 98 F (36.7 C)   Resp 16   Ht 5\' 10"  (1.778 m)   Wt 192 lb 3.2 oz (87.2 kg)   SpO2 99%   BMI 27.58 kg/m   General Appearance: Well nourished and well groomed and in no apparent distress.  Eyes: PERRLA, EOMs, conjunctiva no swelling or erythema, normal fundi and vessels. Sinuses: No frontal/maxillary tenderness ENT/Mouth: EACs patent / TMs  nl. Nares clear without erythema, swelling, mucoid exudates. Oral hygiene is good. No erythema, swelling, or exudate. Tongue normal, non-obstructing. Tonsils not swollen or erythematous. Hearing normal.  Neck: Supple, thyroid not palpable. No bruits, nodes or JVD. Respiratory: Respiratory effort normal.  BS equal and clear bilateral without rales, rhonci, wheezing or stridor. Cardio: Heart sounds are normal with regular rate and rhythm and no murmurs, rubs or gallops. Peripheral pulses are normal and equal bilaterally without edema. No aortic or femoral bruits. Chest: symmetric with normal excursions and percussion.  Abdomen: Soft, with Nl bowel sounds. Nontender, no guarding, rebound, hernias, masses, or organomegaly.  Lymphatics: Non tender without lymphadenopathy.  Musculoskeletal: Full ROM all peripheral extremities, joint stability, 5/5 strength, and normal gait. Skin: Warm and dry without rashes, lesions, cyanosis, clubbing or  ecchymosis.  Neuro: Cranial nerves intact, reflexes equal bilaterally. Normal muscle tone, no cerebellar symptoms. Sensation intact.  Pysch: Alert and oriented X 3  with normal affect, insight and judgment appropriate.   Assessment and Plan  1. Annual Preventative/Screening Exam    2. Essential hypertension  - EKG 12-Lead - , RETROPERITNL ABD,  LTD - CBC with Differential/Platelet - COMPLETE METABOLIC PANEL WITH GFR - Magnesium - TSH  3. Hyperlipidemia, mixed  - EKG 12-Lead - [place], RETROPERITNL ABD,  LTD - Lipid panel - TSH  4. Abnormal glucose  - EKG 12-Lead - [place], RETROPERITNL ABD,  LTD - Hemoglobin A1c - Insulin, random  5. Vitamin D deficiency  - VITAMIN D 25 Hydroxy  6. Hypothyroidism - TSH  7. BPH with obstruction/lower urinary tract symptoms  - PSA  8. Screening examination for pulmonary tuberculosis  - TB Skin Test  9. Screening for colorectal cancer  - POC Hemoccult Bld/Stl  - Ambulatory referral to Gastroenterology  10. Prostate cancer screening  - PSA  11. Screening for ischemic heart disease  - EKG 12-Lead  12. Screening for AAA (aortic abdominal aneurysm)  - [place], RETROPERITNL ABD,  LTD  13. Smoker  - EKG 12-Lead - [place], RETROPERITNL ABD,  LTD  14. Fatigue, unspecified type  - Iron, Total/Total Iron Binding Cap - Vitamin B12 - Testosterone - CBC with Differential/Platelet - TSH  15. Medication management  - Urinalysis, Routine w reflex microscopic - COMPLETE METABOLIC PANEL WITH GFR - Magnesium - Lipid panel - TSH - Hemoglobin A1c - Insulin, random - VITAMIN D 25 Hydroxy           Patient was counseled in prudent diet, weight control to achieve/maintain BMI less than 25, BP monitoring, regular exercise and medications as discussed.  Discussed med effects and SE's. Routine screening labs and tests as requested with regular follow-up as recommended. Over 40 minutes of exam, counseling, chart review and high complex critical decision making was performed   US, MD

## 2022-02-28 LAB — VITAMIN D 25 HYDROXY (VIT D DEFICIENCY, FRACTURES): Vit D, 25-Hydroxy: 50 ng/mL (ref 30–100)

## 2022-02-28 LAB — VITAMIN B12: Vitamin B-12: >2000 pg/mL — ABNORMAL HIGH (ref 200–1100)

## 2022-02-28 LAB — PSA: PSA: 0.22 ng/mL (ref <=4.00)

## 2022-02-28 LAB — URINALYSIS, ROUTINE W REFLEX MICROSCOPIC
Bilirubin Urine: NEGATIVE
Glucose, UA: NEGATIVE
Hgb urine dipstick: NEGATIVE
Ketones, ur: NEGATIVE
Leukocytes,Ua: NEGATIVE
Nitrite: NEGATIVE
Protein, ur: NEGATIVE
Specific Gravity, Urine: 1.009 (ref 1.001–1.035)
pH: 6 (ref 5.0–8.0)

## 2022-02-28 LAB — COMPLETE METABOLIC PANEL WITH GFR
AG Ratio: 2.1 (calc) (ref 1.0–2.5)
ALT: 28 U/L (ref 9–46)
AST: 24 U/L (ref 10–35)
Albumin: 4.7 g/dL (ref 3.6–5.1)
Alkaline phosphatase (APISO): 66 U/L (ref 35–144)
BUN: 10 mg/dL (ref 7–25)
CO2: 24 mmol/L (ref 20–32)
Calcium: 9.8 mg/dL (ref 8.6–10.3)
Chloride: 104 mmol/L (ref 98–110)
Creat: 0.85 mg/dL (ref 0.70–1.35)
Globulin: 2.2 g/dL (calc) (ref 1.9–3.7)
Glucose, Bld: 75 mg/dL (ref 65–99)
Potassium: 4.4 mmol/L (ref 3.5–5.3)
Sodium: 138 mmol/L (ref 135–146)
Total Bilirubin: 0.8 mg/dL (ref 0.2–1.2)
Total Protein: 6.9 g/dL (ref 6.1–8.1)
eGFR: 98 mL/min/{1.73_m2} (ref >=60)

## 2022-02-28 LAB — CBC WITH DIFFERENTIAL/PLATELET
Absolute Monocytes: 710 cells/uL (ref 200–950)
Basophils Absolute: 47 cells/uL (ref 0–200)
Basophils Relative: 0.7 %
Eosinophils Absolute: 87 cells/uL (ref 15–500)
Eosinophils Relative: 1.3 %
HCT: 42.6 % (ref 38.5–50.0)
Hemoglobin: 15 g/dL (ref 13.2–17.1)
Lymphs Abs: 2184 cells/uL (ref 850–3900)
MCH: 34.5 pg — ABNORMAL HIGH (ref 27.0–33.0)
MCHC: 35.2 g/dL (ref 32.0–36.0)
MCV: 97.9 fL (ref 80.0–100.0)
MPV: 8.9 fL (ref 7.5–12.5)
Monocytes Relative: 10.6 %
Neutro Abs: 3672 cells/uL (ref 1500–7800)
Neutrophils Relative %: 54.8 %
Platelets: 273 10*3/uL (ref 140–400)
RBC: 4.35 10*6/uL (ref 4.20–5.80)
RDW: 12.2 % (ref 11.0–15.0)
Total Lymphocyte: 32.6 %
WBC: 6.7 10*3/uL (ref 3.8–10.8)

## 2022-02-28 LAB — IRON, TOTAL/TOTAL IRON BINDING CAP
%SAT: 33 % (calc) (ref 20–48)
Iron: 131 ug/dL (ref 50–180)
TIBC: 401 mcg/dL (calc) (ref 250–425)

## 2022-02-28 LAB — LIPID PANEL
Cholesterol: 176 mg/dL (ref <200)
HDL: 67 mg/dL (ref >=40)
LDL Cholesterol (Calc): 91 mg/dL (calc)
Non-HDL Cholesterol (Calc): 109 mg/dL (calc) (ref <130)
Total CHOL/HDL Ratio: 2.6 (calc) (ref <5.0)
Triglycerides: 86 mg/dL (ref <150)

## 2022-02-28 LAB — HEMOGLOBIN A1C
Hgb A1c MFr Bld: 5.5 % of total Hgb (ref <5.7)
Mean Plasma Glucose: 111 mg/dL
eAG (mmol/L): 6.2 mmol/L

## 2022-02-28 LAB — MICROALBUMIN / CREATININE URINE RATIO
Creatinine, Urine: 54 mg/dL (ref 20–320)
Microalb Creat Ratio: 4 mcg/mg creat (ref <30)
Microalb, Ur: 0.2 mg/dL

## 2022-02-28 LAB — MAGNESIUM: Magnesium: 2.2 mg/dL (ref 1.5–2.5)

## 2022-02-28 LAB — INSULIN, RANDOM: Insulin: 5 u[IU]/mL

## 2022-02-28 LAB — TSH: TSH: 4.33 mIU/L (ref 0.40–4.50)

## 2022-02-28 LAB — TESTOSTERONE: Testosterone: 468 ng/dL (ref 250–827)

## 2022-02-28 NOTE — Progress Notes (Signed)
<><><><><><><><><><><><><><><><><><><><><><><><><><><><><><><><><> <><><><><><><><><><><><><><><><><><><><><><><><><><><><><><><><><> -   Test results slightly outside the reference range are not unusual. If there is anything important, I will review this with you,  otherwise it is considered normal test values.  If you have further questions,  please do not hesitate to contact me at the office or via My Chart.  <><><><><><><><><><><><><><><><><><><><><><><><><><><><><><><><><> <><><><><><><><><><><><><><><><><><><><><><><><><><><><><><><><><>  -  Vitamin B12 level is very high,                        So recommend that you decrease your Vitamin B12 to 1 x /week  <><><><><><><><><><><><><><><><><><><><><><><><><><><><><><><><><>  -  Iron levels are Normal & OK  <><><><><><><><><><><><><><><><><><><><><><><><><><><><><><><><><>  -  PSA - Low - No Prostate cancer - Great ! <><><><><><><><><><><><><><><><><><><><><><><><><><><><><><><><><>  -  Testosterone level - Normal  -  <><><><><><><><><><><><><><><><><><><><><><><><><><><><><><><><><>  -  Chol = 176  Excellent   - Very low risk for Heart Attack  / Stroke <><><><><><><><><><><><><><><><><><><><><><><><><><><><><><><><><>  -  A1c - Normal - No Diabetes - Great ! <><><><><><><><><><><><><><><><><><><><><><><><><><><><><><><><><>  -  Vitamin D= 50  - Sl  Low   - Vitamin D goal is between 70-100.   - Please make sure that you are taking your Vitamin D as directed.   - It is very important as a natural anti-inflammatory and helping the                                          immune system protect against viral infections, like the Covid-19    helping hair, skin, and nails, as well as reducing stroke and heart attack risk.   - It helps your bones and helps with mood.  - It also decreases numerous cancer risks so please  take it as directed.   - Low Vit D is associated with a 200-300% higher risk for  CANCER   and  200-300% higher risk for HEART   ATTACK  &  STROKE.    - It is also associated with higher death rate at younger ages,   autoimmune diseases like Rheumatoid arthritis, Lupus,  Multiple Sclerosis.     - Also many other serious conditions, like depression, Alzheimer's  Dementia,  muscle aches, fatigue, fibromyalgia  <><><><><><><><><><><><><><><><><><><><><><><><><><><><><><><><><> <><><><><><><><><><><><><><><><><><><><><><><><><><><><><><><><><>  -  All Else - CBC - Kidneys - Electrolytes - Liver - Magnesium & Thyroid    - all  Normal / OK <><><><><><><><><><><><><><><><><><><><><><><><><><><><><><><><><> <><><><><><><><><><><><><><><><><><><><><><><><><><><><><><><><><>

## 2022-04-10 ENCOUNTER — Encounter: Payer: Self-pay | Admitting: Internal Medicine

## 2022-09-02 NOTE — Progress Notes (Unsigned)
6 Month Follow Up   Assessment and Plan:   Essential hypertension Continue current regimen Monitor blood pressure at home; patient to call if consistently greater than 140/90 Continue DASH diet.   Reminder to go to the ER if any CP, SOB, nausea, dizziness, severe HA, changes vision/speech, left arm numbness and tingling and jaw pain  Hyperlipidemia, mixed Continue Red yeast rice Discussed dietary and exercise modifications -     Lipid panel -     TSH  Abnormal Glucose Recent A1Cs at goal Discussed diet/exercise, weight management  Defer A1C; check CMP  Vitamin D deficiency Near goal at recent check; continue to recommend supplementation for goal of 60-100 Defer vitamin D level  Insomnia, unspecified type  Doing well on current regimen Alprazolam 1/2 tab prn, only needs 1-2/week  good sleep hygiene discussed  Erectile dysfunction, unspecified erectile dysfunction type Doing well Using sildenafil 100mg  PRN   BMI 27.0-27.9,adult Discussed dietary and exercise modifications  Medication management Minimal labs due to high deductible insurance, gets large lab bill  -     COMPLETE METABOLIC PANEL WITH GFR  Smoker, 30+ pack year history Discussed risks associated with tobacco use and advised to reduce or quit Patient is not ready to do so, but advised to consider strongly Will follow up at the next visit  -lung cancer screening with low dose CT discussed as recommended by guidelines based on age, number of pack year history.  Discussed risks of screening including but not limited to false positives on xray, further testing or consultation with specialist, and possible false negative CT as well. Understanding expressed, is interested but not at this time due to high deductible insurance.     Continue diet and meds as discussed. Further disposition pending results of labs. Discussed med's effects and SE's.   Over 30 minutes of exam, counseling, chart review, and critical  decision making was performed.   Future Appointments  Date Time Provider Department Center  09/03/2022  3:30 PM Raynelle Dick, NP [facility] None  03/02/2023  2:00 PM Lucky Cowboy, MD [facility] None    ----------------------------------------------------------------------------------------------------------------------  HPI 62 y.o. male  presents for 3 month follow up on HTN, HLD, history of pre-diabetes,insomnia, migraines, ED, weight and vitamin D deficiency.   He has hx of migraines, had when got off of verapamil, none since resuming.   He is prescribed xanax for insomnia, long hx of this, uses xanax sparingly, typically takes 1/2 tab 1-2 days per week.   he currently continues to smoke 0.75 pack a day average since 1975; ~35 pack year. Discussed risks associated with smoking, patient is not ready to quit. Discussed low dose screening CT, declines due to high deductible insurance.   BMI is There is no height or weight on file to calculate BMI., he has been working on diet, not intentional exercise but walks a lot at work (several miles) and very active in yard.  Wt Readings from Last 3 Encounters:  02/27/22 192 lb 3.2 oz (87.2 kg)  09/03/21 190 lb 9.6 oz (86.5 kg)  02/22/21 192 lb (87.1 kg)    HTN predates 2004, on verapamil due to migraines  His blood pressure has been controlled at home, today their BP is    He does not workout. He denies any cardiac symptoms, chest pains, palpitations, shortness of breath, dizziness or lower extremity edema.     He is on cholesterol medication omega 3 and Red Yeast Rice Supplement and denies myalgias. His cholesterol is not  at goal. The cholesterol last visit was:   Lab Results  Component Value Date   CHOL 176 02/27/2022   HDL 67 02/27/2022   LDLCALC 91 02/27/2022   TRIG 86 02/27/2022   CHOLHDL 2.6 02/27/2022    He has not been working on diet and exercise for hx of prediabetes (A1c 5.7%, 2011), and denies foot ulcerations,  paresthesia of the feet, polydipsia, polyuria and visual disturbances. Last A1C in the office was:  Lab Results  Component Value Date   HGBA1C 5.5 02/27/2022    Last GFR:  Lab Results  Component Value Date   GFRNONAA 75 08/08/2020   Patient is on Vitamin D supplement.   Lab Results  Component Value Date   VD25OH 50 02/27/2022       Current Medications:  Current Outpatient Medications on File Prior to Visit  Medication Sig   acyclovir (ZOVIRAX) 200 MG capsule as needed.    ALPRAZolam (XANAX) 0.5 MG tablet Take       1/2-1 tablet       at Bedtime       ONLY        if needed for Sleep &  limit to 5 days /week to avoid addiction   aspirin 81 MG tablet Take 81 mg by mouth daily.    cetirizine (ZYRTEC) 10 MG tablet Take 10 mg by mouth daily.   Cholecalciferol (VITAMIN D PO) Take 10,000 Int'l Units by mouth daily.   cyanocobalamin 1000 MCG tablet Take 1,000 mcg by mouth daily.   Flaxseed, Linseed, (FLAXSEED OIL PO) Take by mouth daily.   levothyroxine (SYNTHROID) 75 MCG tablet Take  1 tablet  Daily  on an empty stomach with only water for 30 minutes & no Antacid meds, Calcium or Magnesium for 4 hours & avoid Biotin   meloxicam (MOBIC) 15 MG tablet TAKE 1/2 TO 1 TAB DAILY WITH FOOD FOR PAIN/INFLAMMATION LIMIT TO 5 TABS/WEEK TO AVOID KIDNEY DAMAGE   Omega-3 Fatty Acids (FISH OIL PO) Take by mouth daily.   Red Yeast Rice Extract (RED YEAST RICE PO) Take by mouth daily.   rizatriptan (MAXALT-MLT) 10 MG disintegrating tablet Take 1 tablet Immediately for Migraine & May repeat in 2 hours if needed ( Maximum 2 tablets /24 hours )   sildenafil (VIAGRA) 100 MG tablet Take  1/2 to 1 tablet  Daily  if Needed for XXXX   verapamil (CALAN-SR) 240 MG CR tablet TAKE 1 TABLET DAILY FOR BP & CLUSTER HA PROPHYLAXIS   Zinc 50 MG TABS Take by mouth daily.   No current facility-administered medications on file prior to visit.    Allergies:  Allergies  Allergen Reactions   Topamax [Topiramate]      Medical History:  Past Medical History:  Diagnosis Date   Cluster headache    Hyperlipidemia    Hypertension    Prediabetes    Vitamin D deficiency    Family history- Reviewed and unchanged  Social history- Reviewed and unchanged  Review of Systems:  Review of Systems  Constitutional:  Negative for malaise/fatigue and weight loss.  HENT:  Negative for hearing loss and tinnitus.   Eyes:  Negative for blurred vision and double vision.  Respiratory:  Negative for cough, shortness of breath and wheezing.   Cardiovascular:  Negative for chest pain, palpitations, orthopnea, claudication and leg swelling.  Gastrointestinal:  Negative for abdominal pain, blood in stool, constipation, diarrhea, heartburn, melena, nausea and vomiting.  Genitourinary: Negative.   Musculoskeletal:  Negative for  joint pain and myalgias.  Skin:  Negative for rash.  Neurological:  Negative for dizziness, tingling, sensory change, weakness and headaches.  Endo/Heme/Allergies:  Negative for polydipsia.  Psychiatric/Behavioral: Negative.    All other systems reviewed and are negative.   Physical Exam: There were no vitals taken for this visit. Wt Readings from Last 3 Encounters:  02/27/22 192 lb 3.2 oz (87.2 kg)  09/03/21 190 lb 9.6 oz (86.5 kg)  02/22/21 192 lb (87.1 kg)   General Appearance: Well nourished, in no apparent distress. Eyes: PERRLA, EOMs, conjunctiva no swelling or erythema Sinuses: No Frontal/maxillary tenderness ENT/Mouth: Ext aud canals clear, TMs without erythema, bulging. No erythema, swelling, or exudate on post pharynx.  Tonsils not swollen or erythematous. Hearing normal.  Neck: Supple, thyroid normal.  Respiratory: Respiratory effort normal, BS equal bilaterally without rales, rhonchi, wheezing or stridor.  Cardio: RRR with no MRGs. Brisk peripheral pulses without edema.  Abdomen: Soft, + BS.  Non tender, no guarding, rebound, hernias, masses. Lymphatics: Non tender without  lymphadenopathy.  Musculoskeletal: Full ROM, 5/5 strength, normal gait Skin: Warm, dry without rashes, lesions, ecchymosis.  Neuro: Cranial nerves intact. No cerebellar symptoms.  Psych: Awake and oriented X 3, normal affect, Insight and Judgment appropriate.    Raynelle Dick, NP [facility] 14:30 PM

## 2022-09-03 ENCOUNTER — Encounter: Payer: Self-pay | Admitting: Nurse Practitioner

## 2022-09-03 ENCOUNTER — Ambulatory Visit (INDEPENDENT_AMBULATORY_CARE_PROVIDER_SITE_OTHER): Payer: 59 | Admitting: Nurse Practitioner

## 2022-09-03 VITALS — BP 130/72 | HR 68 | Temp 98.1°F | Ht 70.0 in | Wt 190.2 lb

## 2022-09-03 DIAGNOSIS — E039 Hypothyroidism, unspecified: Secondary | ICD-10-CM | POA: Diagnosis not present

## 2022-09-03 DIAGNOSIS — I10 Essential (primary) hypertension: Secondary | ICD-10-CM | POA: Diagnosis not present

## 2022-09-03 DIAGNOSIS — N5201 Erectile dysfunction due to arterial insufficiency: Secondary | ICD-10-CM

## 2022-09-03 DIAGNOSIS — R7309 Other abnormal glucose: Secondary | ICD-10-CM | POA: Diagnosis not present

## 2022-09-03 DIAGNOSIS — E782 Mixed hyperlipidemia: Secondary | ICD-10-CM | POA: Diagnosis not present

## 2022-09-03 DIAGNOSIS — G44019 Episodic cluster headache, not intractable: Secondary | ICD-10-CM

## 2022-09-03 DIAGNOSIS — F17200 Nicotine dependence, unspecified, uncomplicated: Secondary | ICD-10-CM

## 2022-09-03 DIAGNOSIS — E559 Vitamin D deficiency, unspecified: Secondary | ICD-10-CM

## 2022-09-03 DIAGNOSIS — E663 Overweight: Secondary | ICD-10-CM

## 2022-09-03 DIAGNOSIS — Z79899 Other long term (current) drug therapy: Secondary | ICD-10-CM

## 2022-09-03 DIAGNOSIS — Z6827 Body mass index (BMI) 27.0-27.9, adult: Secondary | ICD-10-CM

## 2022-09-03 NOTE — Patient Instructions (Signed)

## 2022-09-04 ENCOUNTER — Other Ambulatory Visit: Payer: Self-pay | Admitting: Nurse Practitioner

## 2022-09-04 DIAGNOSIS — E871 Hypo-osmolality and hyponatremia: Secondary | ICD-10-CM

## 2022-09-04 LAB — COMPLETE METABOLIC PANEL WITH GFR
AG Ratio: 2 (calc) (ref 1.0–2.5)
ALT: 24 U/L (ref 9–46)
AST: 23 U/L (ref 10–35)
Albumin: 4.3 g/dL (ref 3.6–5.1)
Alkaline phosphatase (APISO): 66 U/L (ref 35–144)
BUN: 13 mg/dL (ref 7–25)
CO2: 24 mmol/L (ref 20–32)
Calcium: 9.7 mg/dL (ref 8.6–10.3)
Chloride: 101 mmol/L (ref 98–110)
Creat: 0.98 mg/dL (ref 0.70–1.35)
Globulin: 2.2 g/dL (calc) (ref 1.9–3.7)
Glucose, Bld: 78 mg/dL (ref 65–99)
Potassium: 4 mmol/L (ref 3.5–5.3)
Sodium: 132 mmol/L — ABNORMAL LOW (ref 135–146)
Total Bilirubin: 0.7 mg/dL (ref 0.2–1.2)
Total Protein: 6.5 g/dL (ref 6.1–8.1)
eGFR: 86 mL/min/{1.73_m2} (ref >=60)

## 2022-09-04 LAB — CBC WITH DIFFERENTIAL/PLATELET
Absolute Monocytes: 945 cells/uL (ref 200–950)
Basophils Absolute: 52 cells/uL (ref 0–200)
Basophils Relative: 0.9 %
Eosinophils Absolute: 87 cells/uL (ref 15–500)
Eosinophils Relative: 1.5 %
HCT: 44.5 % (ref 38.5–50.0)
Hemoglobin: 15.2 g/dL (ref 13.2–17.1)
Lymphs Abs: 1630 cells/uL (ref 850–3900)
MCH: 33.6 pg — ABNORMAL HIGH (ref 27.0–33.0)
MCHC: 34.2 g/dL (ref 32.0–36.0)
MCV: 98.5 fL (ref 80.0–100.0)
MPV: 8.9 fL (ref 7.5–12.5)
Monocytes Relative: 16.3 %
Neutro Abs: 3086 cells/uL (ref 1500–7800)
Neutrophils Relative %: 53.2 %
Platelets: 275 10*3/uL (ref 140–400)
RBC: 4.52 10*6/uL (ref 4.20–5.80)
RDW: 12.5 % (ref 11.0–15.0)
Total Lymphocyte: 28.1 %
WBC: 5.8 10*3/uL (ref 3.8–10.8)

## 2022-09-04 LAB — LIPID PANEL
Cholesterol: 162 mg/dL (ref <200)
HDL: 53 mg/dL (ref >=40)
LDL Cholesterol (Calc): 84 mg/dL (calc)
Non-HDL Cholesterol (Calc): 109 mg/dL (calc) (ref <130)
Total CHOL/HDL Ratio: 3.1 (calc) (ref <5.0)
Triglycerides: 156 mg/dL — ABNORMAL HIGH (ref <150)

## 2022-09-04 LAB — TSH: TSH: 4.29 mIU/L (ref 0.40–4.50)

## 2022-09-18 ENCOUNTER — Other Ambulatory Visit: Payer: Self-pay

## 2022-09-18 ENCOUNTER — Other Ambulatory Visit: Payer: 59

## 2022-09-18 DIAGNOSIS — E871 Hypo-osmolality and hyponatremia: Secondary | ICD-10-CM

## 2022-10-04 ENCOUNTER — Other Ambulatory Visit: Payer: Self-pay | Admitting: Internal Medicine

## 2022-10-04 DIAGNOSIS — E039 Hypothyroidism, unspecified: Secondary | ICD-10-CM

## 2022-12-19 NOTE — Progress Notes (Unsigned)
6 Month Follow Up   Assessment and Plan:   Essential hypertension Continue current regimen Monitor blood pressure at home; patient to call if consistently greater than 140/90 Continue DASH diet.   Reminder to go to the ER if any CP, SOB, nausea, dizziness, severe HA, changes vision/speech, left arm numbness and tingling and jaw pain  Hyperlipidemia, mixed Continue Red yeast rice Discussed dietary and exercise modifications -     Lipid panel  Hypothyroidism Please take your thyroid medication greater than 30 min before breakfast, separated by at least 4 hours  from antacids, calcium, iron, and multivitamins.  - TSH  Episodic cluster headaches Well controlled with verapamil  Abnormal Glucose Recent A1Cs at goal Discussed diet/exercise, weight management  Check CMP  Vitamin D deficiency Near goal at recent check; continue to recommend supplementation for goal of 60-100 Defer vitamin D level    Erectile dysfunction, unspecified erectile dysfunction type Doing well Using sildenafil 100mg  PRN   Overweight BMI 27.0-27.9,adult Discussed dietary and exercise modifications  Hyponatremia - CMP If persists will do further work up  Medication management Minimal labs due to high deductible insurance, gets large lab bill  -     COMPLETE METABOLIC PANEL WITH GFR - CBC - TSH  Smoker, 30+ pack year history Discussed risks associated with tobacco use and advised to reduce or quit Patient is not ready to do so, but advised to consider strongly Will follow up at the next visit  -lung cancer screening with low dose CT discussed as recommended by guidelines based on age, number of pack year history.  Discussed risks of screening including but not limited to false positives on xray, further testing or consultation with specialist, and possible false negative CT as well. Understanding expressed, is interested but not at this time due to high deductible insurance.     Continue  diet and meds as discussed. Further disposition pending results of labs. Discussed med's effects and SE's.   Over 30 minutes of exam, counseling, chart review, and critical decision making was performed.   Future Appointments  Date Time Provider Department Center  03/02/2023  2:00 PM Lucky Cowboy, MD [facility] None    ----------------------------------------------------------------------------------------------------------------------  HPI 62 y.o. male  presents for 3 month follow up on HTN, HLD, history of abnormal glucose, insomnia, migraines, ED, weight and vitamin D deficiency.   He has hx of migraines, controlled well with use of verapamil 240 mg every day .  he currently continues to smoke 0.5 pack a day average since 1975; ~35 pack year. Discussed risks associated with smoking, patient is not ready to quit. Discussed low dose screening CT, declines due to high deductible insurance.   He does get pain in his hands from arthritis and will use Meloxicam every other day with good results.   BMI is Body mass index is 27.12 kg/m., he has been working on diet, not intentional exercise but walks a lot at work (several miles) and very active in yard.  Wt Readings from Last 3 Encounters:  12/22/22 189 lb (85.7 kg)  09/03/22 190 lb 3.2 oz (86.3 kg)  02/27/22 192 lb 3.2 oz (87.2 kg)    HTN predates 2004, on verapamil 240 mg every day due to migraines  His blood pressure has been controlled at home, today their BP is BP: 122/66  BP Readings from Last 3 Encounters:  12/22/22 122/66  09/03/22 130/72  02/27/22 136/68  He does not workout. He denies any cardiac symptoms, chest pains, palpitations, shortness  of breath, dizziness or lower extremity edema.     He is on cholesterol medication omega 3 and Red Yeast Rice Supplement and denies myalgias. His cholesterol is not at goal. Limits red meat, dairy.  The cholesterol last visit was:   Lab Results  Component Value Date   CHOL 162  09/03/2022   HDL 53 09/03/2022   LDLCALC 84 09/03/2022   TRIG 156 (H) 09/03/2022   CHOLHDL 3.1 09/03/2022    He has not been working on diet and exercise for hx of prediabetes (A1c 5.7%, 2011), and denies foot ulcerations, paresthesia of the feet, polydipsia, polyuria and visual disturbances. Last A1C in the office was:  Lab Results  Component Value Date   HGBA1C 5.5 02/27/2022   He drinks a lot of water.  Last GFR:  Lab Results  Component Value Date   EGFR 93 09/18/2022    Patient is on Vitamin D supplement.   Lab Results  Component Value Date   VD25OH 66 02/27/2022     He is on levothyroxine 75 mcg daily for hypothyroidism.  Has been taking the medication daily. Last TSH was: Lab Results  Component Value Date   TSH 4.29 09/03/2022     Current Medications:  Current Outpatient Medications on File Prior to Visit  Medication Sig   acyclovir (ZOVIRAX) 200 MG capsule as needed.    aspirin 81 MG tablet Take 81 mg by mouth daily.    cetirizine (ZYRTEC) 10 MG tablet Take 10 mg by mouth daily.   Cholecalciferol (VITAMIN D PO) Take 10,000 Int'l Units by mouth daily.   cyanocobalamin 1000 MCG tablet Take 1,000 mcg by mouth daily.   Flaxseed, Linseed, (FLAXSEED OIL PO) Take by mouth daily.   levothyroxine (SYNTHROID) 75 MCG tablet Take  1 tablet  Daily  on an empty stomach with only water for 30 minutes & no Antacid meds, Calcium or Magnesium for 4 hours & avoid Biotin   meloxicam (MOBIC) 15 MG tablet TAKE 1/2 TO 1 TAB DAILY WITH FOOD FOR PAIN/INFLAMMATION LIMIT TO 5 TABS/WEEK TO AVOID KIDNEY DAMAGE   Omega-3 Fatty Acids (FISH OIL PO) Take by mouth daily.   Red Yeast Rice Extract (RED YEAST RICE PO) Take by mouth daily.   sildenafil (VIAGRA) 100 MG tablet Take  1/2 to 1 tablet  Daily  if Needed for XXXX   verapamil (CALAN-SR) 240 MG CR tablet TAKE 1 TABLET DAILY FOR BP & CLUSTER HA PROPHYLAXIS   Zinc 50 MG TABS Take by mouth daily.   No current facility-administered medications on  file prior to visit.    Allergies:  Allergies  Allergen Reactions   Topamax [Topiramate]     Medical History:  Past Medical History:  Diagnosis Date   Cluster headache    Hyperlipidemia    Hypertension    Prediabetes    Vitamin D deficiency    Family history- Reviewed and unchanged  Social history- Reviewed and unchanged  Eye Doctor: Dr Hilton Cork Glasses 11/2022   Review of Systems:  Review of Systems  Constitutional:  Negative for malaise/fatigue and weight loss.  HENT:  Negative for hearing loss and tinnitus.   Eyes:  Negative for blurred vision and double vision.  Respiratory:  Negative for cough, shortness of breath and wheezing.   Cardiovascular:  Negative for chest pain, palpitations, orthopnea, claudication and leg swelling.  Gastrointestinal:  Negative for abdominal pain, blood in stool, constipation, diarrhea, heartburn, melena, nausea and vomiting.  Genitourinary: Negative.  Musculoskeletal:  Positive for joint pain (hand). Negative for myalgias.  Skin:  Negative for rash.  Neurological:  Negative for dizziness, tingling, sensory change, weakness and headaches.  Endo/Heme/Allergies:  Negative for polydipsia.  Psychiatric/Behavioral: Negative.    All other systems reviewed and are negative.   Physical Exam: BP 122/66   Pulse (!) 53   Temp 97.7 F (36.5 C)   Ht 5\' 10"  (1.778 m)   Wt 189 lb (85.7 kg)   SpO2 98%   BMI 27.12 kg/m  Wt Readings from Last 3 Encounters:  12/22/22 189 lb (85.7 kg)  09/03/22 190 lb 3.2 oz (86.3 kg)  02/27/22 192 lb 3.2 oz (87.2 kg)   General Appearance: Well nourished, in no apparent distress. Eyes: PERRLA, EOMs, conjunctiva no swelling or erythema Sinuses: No Frontal/maxillary tenderness ENT/Mouth: Ext aud canals clear, TMs without erythema, bulging. No erythema, swelling, or exudate on post pharynx.  Hearing normal.  Neck: Supple, thyroid normal.  Respiratory: Respiratory effort normal, BS equal bilaterally without  rales, rhonchi, wheezing or stridor.  Cardio: RRR with no MRGs. Brisk peripheral pulses without edema.  Abdomen: Soft, + BS.  Non tender, no guarding, rebound, hernias, masses. Lymphatics: Non tender without lymphadenopathy.  Musculoskeletal: Full ROM, 5/5 strength, normal gait Skin: Warm, dry without rashes, lesions, ecchymosis.  Neuro: Cranial nerves intact. No cerebellar symptoms.  Psych: Awake and oriented X 3, normal affect, Insight and Judgment appropriate.    Raynelle Dick, NP [facility] 14:30 PM

## 2022-12-22 ENCOUNTER — Ambulatory Visit (INDEPENDENT_AMBULATORY_CARE_PROVIDER_SITE_OTHER): Payer: 59 | Admitting: Nurse Practitioner

## 2022-12-22 ENCOUNTER — Encounter: Payer: Self-pay | Admitting: Nurse Practitioner

## 2022-12-22 VITALS — BP 122/66 | HR 53 | Temp 97.7°F | Ht 70.0 in | Wt 189.0 lb

## 2022-12-22 DIAGNOSIS — E782 Mixed hyperlipidemia: Secondary | ICD-10-CM | POA: Diagnosis not present

## 2022-12-22 DIAGNOSIS — G44019 Episodic cluster headache, not intractable: Secondary | ICD-10-CM

## 2022-12-22 DIAGNOSIS — I10 Essential (primary) hypertension: Secondary | ICD-10-CM | POA: Diagnosis not present

## 2022-12-22 DIAGNOSIS — R7309 Other abnormal glucose: Secondary | ICD-10-CM | POA: Diagnosis not present

## 2022-12-22 DIAGNOSIS — N5201 Erectile dysfunction due to arterial insufficiency: Secondary | ICD-10-CM

## 2022-12-22 DIAGNOSIS — E871 Hypo-osmolality and hyponatremia: Secondary | ICD-10-CM

## 2022-12-22 DIAGNOSIS — E039 Hypothyroidism, unspecified: Secondary | ICD-10-CM

## 2022-12-22 DIAGNOSIS — Z6827 Body mass index (BMI) 27.0-27.9, adult: Secondary | ICD-10-CM

## 2022-12-22 DIAGNOSIS — E559 Vitamin D deficiency, unspecified: Secondary | ICD-10-CM

## 2022-12-22 DIAGNOSIS — Z79899 Other long term (current) drug therapy: Secondary | ICD-10-CM

## 2022-12-22 DIAGNOSIS — E663 Overweight: Secondary | ICD-10-CM

## 2022-12-22 DIAGNOSIS — F17200 Nicotine dependence, unspecified, uncomplicated: Secondary | ICD-10-CM

## 2022-12-22 NOTE — Patient Instructions (Signed)
Hyponatremia Hyponatremia is when the amount of salt (sodium) in a person's blood is too low. When sodium levels are low, the cells absorb extra water, which causes them to swell. The swelling happens throughout the body, but it mostly affects the brain. What are the causes? This condition may be caused by: Certain medical conditions, such as: Heart, kidney, or liver problems. Thyroid problems. Adrenal gland problems. Metabolic conditions, such as Addison's disease or syndrome of inappropriate antidiuresis (SIAD). Excessive vomiting, diarrhea, or sweating. Certain medicines or illegal drugs. Fluids given through an IV. What increases the risk? You are more likely to develop this condition if you: Have certain medical conditions such as heart, kidney, or liver failure. Have a medical condition that causes frequent or excessive diarrhea. Participate in intense physical activities, such as marathon running. Take certain medicines that affect the sodium and fluid balance in the blood. Some of these medicine types include: Diuretics. NSAIDs, such as ibuprofen. Some opioid pain medicines. Some antidepressants. Some seizure prevention medicines. What are the signs or symptoms? Symptoms of this condition include: Headache. Nausea and vomiting. Being very tired (lethargic). Muscle weakness and cramping. Loss of appetite. Feeling weak or light-headed. Severe symptoms of this condition include: Confusion. Agitation. Having a rapid heart rate. Fainting. Seizures. Coma. How is this diagnosed? This condition is diagnosed based on: A physical exam. Your medical history. Tests, including: Blood tests. Urine tests. How is this treated? Treatment for this condition depends on the cause. Treatment may include: Getting fluids through an IV that is inserted into one of your veins. Medicines to correct the sodium imbalance. If medicines are causing the condition, the medicines will need to  be adjusted. Limiting your water or fluid intake to get the correct sodium balance, in certain cases. Monitoring in the hospital to closely watch your symptoms for improvement. Follow these instructions at home:  Take over-the-counter and prescription medicines only as told by your health care provider. Many medicines can make this condition worse. Talk with your health care provider about any medicines that you are currently taking. Do not drink alcohol. Keep all follow-up visits. This is important. Contact a health care provider if: You develop worsening nausea, fatigue, headache, confusion, or weakness. Your symptoms go away and then return. Get help right away if: You have a seizure. You faint. You have ongoing diarrhea or vomiting. Summary Hyponatremia is when the amount of salt (sodium) in your blood is too low. When sodium levels are low, your cells absorb extra water, which causes them to swell. The swelling happens throughout the body, but it mostly affects the brain. Treatment for this condition depends on the cause. It may include receiving IV fluids, taking or adjusting medicines, limiting fluid intake, and monitoring in the hospital. This information is not intended to replace advice given to you by your health care provider. Make sure you discuss any questions you have with your health care provider. Document Revised: 08/14/2020 Document Reviewed: 08/14/2020 Elsevier Patient Education  2024 [employer].

## 2022-12-23 ENCOUNTER — Other Ambulatory Visit: Payer: Self-pay | Admitting: Nurse Practitioner

## 2022-12-23 DIAGNOSIS — E039 Hypothyroidism, unspecified: Secondary | ICD-10-CM

## 2022-12-23 LAB — CBC WITH DIFFERENTIAL/PLATELET
Absolute Lymphocytes: 1629 {cells}/uL (ref 850–3900)
Absolute Monocytes: 946 {cells}/uL (ref 200–950)
Basophils Absolute: 43 {cells}/uL (ref 0–200)
Basophils Relative: 0.7 %
Eosinophils Absolute: 61 {cells}/uL (ref 15–500)
Eosinophils Relative: 1 %
HCT: 45.5 % (ref 38.5–50.0)
Hemoglobin: 15.8 g/dL (ref 13.2–17.1)
MCH: 34.3 pg — ABNORMAL HIGH (ref 27.0–33.0)
MCHC: 34.7 g/dL (ref 32.0–36.0)
MCV: 98.7 fL (ref 80.0–100.0)
MPV: 8.9 fL (ref 7.5–12.5)
Monocytes Relative: 15.5 %
Neutro Abs: 3422 {cells}/uL (ref 1500–7800)
Neutrophils Relative %: 56.1 %
Platelets: 306 10*3/uL (ref 140–400)
RBC: 4.61 10*6/uL (ref 4.20–5.80)
RDW: 12.6 % (ref 11.0–15.0)
Total Lymphocyte: 26.7 %
WBC: 6.1 10*3/uL (ref 3.8–10.8)

## 2022-12-23 LAB — LIPID PANEL
Cholesterol: 163 mg/dL (ref <200)
HDL: 58 mg/dL (ref >=40)
LDL Cholesterol (Calc): 86 mg/dL
Non-HDL Cholesterol (Calc): 105 mg/dL (ref <130)
Total CHOL/HDL Ratio: 2.8 (calc) (ref <5.0)
Triglycerides: 91 mg/dL (ref <150)

## 2022-12-23 LAB — COMPLETE METABOLIC PANEL WITH GFR
AG Ratio: 2 (calc) (ref 1.0–2.5)
ALT: 23 U/L (ref 9–46)
AST: 20 U/L (ref 10–35)
Albumin: 4.5 g/dL (ref 3.6–5.1)
Alkaline phosphatase (APISO): 65 U/L (ref 35–144)
BUN: 11 mg/dL (ref 7–25)
CO2: 24 mmol/L (ref 20–32)
Calcium: 9.9 mg/dL (ref 8.6–10.3)
Chloride: 102 mmol/L (ref 98–110)
Creat: 0.86 mg/dL (ref 0.70–1.35)
Globulin: 2.3 g/dL (ref 1.9–3.7)
Glucose, Bld: 78 mg/dL (ref 65–139)
Potassium: 4.2 mmol/L (ref 3.5–5.3)
Sodium: 134 mmol/L — ABNORMAL LOW (ref 135–146)
Total Bilirubin: 0.9 mg/dL (ref 0.2–1.2)
Total Protein: 6.8 g/dL (ref 6.1–8.1)
eGFR: 97 mL/min/{1.73_m2} (ref >=60)

## 2022-12-23 LAB — TSH: TSH: 4.65 m[IU]/L — ABNORMAL HIGH (ref 0.40–4.50)

## 2022-12-23 MED ORDER — LEVOTHYROXINE SODIUM 88 MCG PO TABS: 88.0000 ug | ORAL_TABLET | Freq: Every day | ORAL | 11 refills | Status: DC

## 2023-03-01 NOTE — Progress Notes (Signed)
 [facility]   [facility]   [facility]      INTER[facility]IC[facility]  Elsie Richards, M.D.          Lonell Rous, ANP        Bascom Necessary, FNP  [facility] [address]  [place], [place]. [address] Telephone [phone] Telefax [phone]  Annual  Screening/Preventative Visit  & Comprehensive Evaluation & Examination   Future Appointments  Date Time Provider Department  03/02/2023                        cpe  2:00 PM Richards Elsie, MD [facility]  03/10/2024                        cpe  2:00 PM Richards Elsie, MD [facility]            This very nice 63 y.o. MWM  presents for a Screening /Preventative Visit & comprehensive evaluation and management of multiple medical co-morbidities.  Patient has been followed for HTN, HLD, Prediabetes, Hypothyroidism  and Vitamin D  Deficiency.        HTN predates since 2004. Patient's BP has been controlled at home.  Today's BP was initially elevated & rechecked at goal - 130/86  . Patient denies any cardiac symptoms as chest pain, palpitations, shortness of breath, dizziness or ankle swelling.        Patient's hyperlipidemia is controlled with diet and medications. Patient denies myalgias or other medication SE's. Last lipids were at goal :  Lab Results  Component Value Date   CHOL 163 12/22/2022   HDL 58 12/22/2022   LDLCALC 86 12/22/2022   TRIG 91 12/22/2022   CHOLHDL 2.8 12/22/2022         Patient has hx/o prediabetes (A1c 5.7% /2011) and patient denies reactive hypoglycemic symptoms, visual blurring, diabetic polys or paresthesias. Last A1c was at goal :   Lab Results  Component Value Date   HGBA1C 5.5 02/27/2022                                            In May 2021, Patient was dx'd with mild Hypothyroidism and initiated on Thyroid Replacement.         Finally, patient has history of Vitamin D  Deficiency (33 /2011) and last vitamin D  was at goal :   Lab Results  Component Value Date    VD25OH 50 02/27/2022       Current Outpatient Medications  Medication Instructions   acyclovir 200 MG capsule As needed   aspirin81 mg Daily   cetirizine 10 mg,  Daily   VITAMIN D    10,000  Units  Daily   Cyanocobalamin   1,000 mcg   Daily   FLAXSEED OIL  Daily   levothyroxine  88 mcg Daily   meloxicam   15 MG tablet TAKE 1/2 TO 1 TAB DAILY    Omega-3 FISH OIL  Daily   Red Yeast Rice Extract  Daily   Sildenafil    100 MG tablet Take  1/2 to 1 tablet  Daily  if Needed    Verapamil  -SR 240 MG CR TAKE 1 TABLET DAILY    Zinc 50 MG TABS Oral, Daily     Allergies  Allergen Reactions   Topamax [Topiramate]  Past Medical History:  Diagnosis Date   Cluster headache    Hyperlipidemia    Hypertension    Prediabetes    Vitamin D  deficiency     Health Maintenance  Topic Date Due   Pneumococcal Vaccine  04/24/1964   Zoster Vaccines- Shingrix (1 of 2) Never done   COVID-19 Vaccine 06/14/2019   INFLUENZA VACCINE  09/17/2020   TETANUS/TDAP  07/05/2028   Hepatitis C Screening  Completed   HIV Screening  Completed   HPV VACCINES  Aged Out     Immunization History  Administered Date(s) Administered   Influenza 11/18/2016, 11/25/2017, 11/18/2019   Janssen (J&J) SARS-COV-2 Vacc 05/17/2019   PPD Test 12/21/2017, 01/11/2019, 02/06/2020   Pneumococcal - 23 07/24/2008   Td 04/14/2006, 04/09/2008   Tdap 07/06/2018    Last Colon - 2010 [facility] - recc 10 yr f/u                                          - overdue - Patient prefers to establish with GI in [place]  Past Surgical History:  Procedure Laterality Date   APPENDECTOMY  1972   INCISE AND DRAIN ABCESS  2008    right shoulder   PLEURAL SCARIFICATION Left 1979   chest tube thoracostomy     Family History  Problem Relation Age of Onset   Stroke Father      Social History   Tobacco Use   Smoking status: Every Day    Packs/day: 0.75    Years: 46.00    Pack years: 34.50    Types:  Cigarettes    Start date: 1975   Smokeless tobacco: Never  Substance Use Topics   Alcohol use: Yes,  occas beer   Drug use: No      ROS Constitutional: Denies fever, chills, weight loss/gain, headaches, insomnia,  night sweats or change in appetite. Does c/o fatigue. Eyes: Denies redness, blurred vision, diplopia, discharge, itchy or watery eyes.  ENT: Denies discharge, congestion, post nasal drip, epistaxis, sore throat, earache, hearing loss, dental pain, Tinnitus, Vertigo, Sinus pain or snoring.  Cardio: Denies chest pain, palpitations, irregular heartbeat, syncope, dyspnea, diaphoresis, orthopnea, PND, claudication or edema Respiratory: denies cough, dyspnea, DOE, pleurisy, hoarseness, laryngitis or wheezing.  Gastrointestinal: Denies dysphagia, heartburn, reflux, water brash, pain, cramps, nausea, vomiting, bloating, diarrhea, constipation, hematemesis, melena, hematochezia, jaundice or hemorrhoids Genitourinary: Denies dysuria, frequency, urgency, nocturia, hesitancy, discharge, hematuria or flank pain Musculoskeletal: Denies arthralgia, myalgia, stiffness, Jt. Swelling, pain, limp or strain/sprain. Denies Falls. Skin: Denies puritis, rash, hives, warts, acne, eczema or change in skin lesion Neuro: No weakness, tremor, incoordination, spasms, paresthesia or pain Psychiatric: Denies confusion, memory loss or sensory loss. Denies Depression. Endocrine: Denies change in weight, skin, hair change, nocturia, and paresthesia, diabetic polys, visual blurring or hyper / hypo glycemic episodes.  Heme/Lymph: No excessive bleeding, bruising or enlarged lymph nodes.   Physical Exam  BP 130/86   Pulse 72   Temp 98.3 F (36.8 C)   Resp 16   Ht 5' 10 (1.778 m)   Wt 184 lb 12.8 oz (83.8 kg)   SpO2 97%   BMI 26.52 kg/m   General Appearance: Well nourished and well groomed and in no apparent distress.  Eyes: PERRLA, EOMs, conjunctiva no swelling or erythema, normal fundi and  vessels. Sinuses: No frontal/maxillary tenderness ENT/Mouth: EACs patent / TMs  nl.  Nares clear without erythema, swelling, mucoid exudates. Oral hygiene is good. No erythema, swelling, or exudate. Tongue normal, non-obstructing. Tonsils not swollen or erythematous. Hearing normal.  Neck: Supple, thyroid not palpable. No bruits, nodes or JVD. Respiratory: Respiratory effort normal.  BS equal and clear bilateral without rales, rhonci, wheezing or stridor. Cardio: Heart sounds are normal with regular rate and rhythm and no murmurs, rubs or gallops. Peripheral pulses are normal and equal bilaterally without edema. No aortic or femoral bruits. Chest: symmetric with normal excursions and percussion.  Abdomen: Soft, with Nl bowel sounds. Nontender, no guarding, rebound, hernias, masses, or organomegaly.  Lymphatics: Non tender without lymphadenopathy.  Musculoskeletal: Full ROM all peripheral extremities, joint stability, 5/5 strength, and normal gait. Skin: Warm and dry without rashes, lesions, cyanosis, clubbing or  ecchymosis.  Neuro: Cranial nerves intact, reflexes equal bilaterally. Normal muscle tone, no cerebellar symptoms. Sensation intact.  Pysch: Alert and oriented X 3 with normal affect, insight and judgment appropriate.   Assessment and Plan   1. Annual Preventative/Screening Exam    2. Essential hypertension  - EKG 12-Lead - US , RETROPERITNL ABD,  LTD - Urinalysis, Routine w reflex microscopic - Microalbumin / creatinine urine ratio - CBC with Differential/Platelet - COMPLETE METABOLIC PANEL WITH GFR - Magnesium - TSH   3. Hyperlipidemia, mixed  - EKG 12-Lead - US , RETROPERITNL ABD,  LTD - Lipid panel - TSH   4. Abnormal glucose  - EKG 12-Lead - US , RETROPERITNL ABD,  LTD - Hemoglobin A1c - Insulin , random   5. Vitamin D  deficiency  - VITAMIN D  25 Hydroxy    6. Hypothyroidism  - TSH   7. BPH with obstruction/lower urinary tract symptoms  -  PSA   8. Episodic cluster headache, not intractable   9. Prostate cancer screening  - PSA  10. Screening examination for pulmonary tuberculosis  - TB Skin Test   11. Screening for colorectal cancer  - Ambulatory referral to Gastroenterology   12. Screening for heart disease  - EKG 12-Lead   13. Smoker  - EKG 12-Lead - US , RETROPERITNL ABD,  LTD   14. Screening for AAA (aortic abdominal aneurysm)  - US , RETROPERITNL ABD,  LTD   15. Fatigue,  - Vitamin B12 - Iron, TIBC and Ferritin Panel - Testosterone  - CBC with Differential/Platelet - TSH   16. Medication management  - Urinalysis, Routine w reflex microscopic - Microalbumin / creatinine urine ratio - CBC with Differential/Platelet - COMPLETE METABOLIC PANEL WITH GFR - Magnesium - Lipid panel - TSH - Hemoglobin A1c - Insulin , random - VITAMIN D  25 Hydroxy            Patient was counseled in prudent diet, weight control to achieve/maintain BMI less than 25, BP monitoring, regular exercise and medications as discussed.  Discussed med effects and SE's. Routine screening labs and tests as requested with regular follow-up as recommended. Over 40 minutes of exam, counseling, chart review and high complex critical decision making was performed   Elsie JONETTA Richards, MD

## 2023-03-02 ENCOUNTER — Encounter: Payer: Self-pay | Admitting: Internal Medicine

## 2023-03-02 ENCOUNTER — Ambulatory Visit (INDEPENDENT_AMBULATORY_CARE_PROVIDER_SITE_OTHER): Payer: 59 | Admitting: Internal Medicine

## 2023-03-02 VITALS — BP 130/86 | HR 72 | Temp 98.3°F | Resp 16 | Ht 70.0 in | Wt 184.8 lb

## 2023-03-02 DIAGNOSIS — I700 Atherosclerosis of aorta: Secondary | ICD-10-CM

## 2023-03-02 DIAGNOSIS — I10 Essential (primary) hypertension: Secondary | ICD-10-CM

## 2023-03-02 DIAGNOSIS — Z0001 Encounter for general adult medical examination with abnormal findings: Secondary | ICD-10-CM

## 2023-03-02 DIAGNOSIS — Z111 Encounter for screening for respiratory tuberculosis: Secondary | ICD-10-CM

## 2023-03-02 DIAGNOSIS — Z1211 Encounter for screening for malignant neoplasm of colon: Secondary | ICD-10-CM

## 2023-03-02 DIAGNOSIS — Z125 Encounter for screening for malignant neoplasm of prostate: Secondary | ICD-10-CM

## 2023-03-02 DIAGNOSIS — G44019 Episodic cluster headache, not intractable: Secondary | ICD-10-CM

## 2023-03-02 DIAGNOSIS — R7309 Other abnormal glucose: Secondary | ICD-10-CM

## 2023-03-02 DIAGNOSIS — Z0000 Encounter for general adult medical examination without abnormal findings: Secondary | ICD-10-CM

## 2023-03-02 DIAGNOSIS — R5383 Other fatigue: Secondary | ICD-10-CM

## 2023-03-02 DIAGNOSIS — N138 Other obstructive and reflux uropathy: Secondary | ICD-10-CM

## 2023-03-02 DIAGNOSIS — Z136 Encounter for screening for cardiovascular disorders: Secondary | ICD-10-CM

## 2023-03-02 DIAGNOSIS — E782 Mixed hyperlipidemia: Secondary | ICD-10-CM

## 2023-03-02 DIAGNOSIS — Z79899 Other long term (current) drug therapy: Secondary | ICD-10-CM

## 2023-03-02 DIAGNOSIS — E559 Vitamin D deficiency, unspecified: Secondary | ICD-10-CM

## 2023-03-02 DIAGNOSIS — F17200 Nicotine dependence, unspecified, uncomplicated: Secondary | ICD-10-CM

## 2023-03-02 DIAGNOSIS — E039 Hypothyroidism, unspecified: Secondary | ICD-10-CM

## 2023-03-02 NOTE — Patient Instructions (Signed)

## 2023-03-03 LAB — COMPLETE METABOLIC PANEL WITH GFR
AG Ratio: 2 (calc) (ref 1.0–2.5)
ALT: 25 U/L (ref 9–46)
AST: 21 U/L (ref 10–35)
Albumin: 4.7 g/dL (ref 3.6–5.1)
Alkaline phosphatase (APISO): 70 U/L (ref 35–144)
BUN: 15 mg/dL (ref 7–25)
CO2: 24 mmol/L (ref 20–32)
Calcium: 9.8 mg/dL (ref 8.6–10.3)
Chloride: 103 mmol/L (ref 98–110)
Creat: 0.94 mg/dL (ref 0.70–1.35)
Globulin: 2.3 g/dL (ref 1.9–3.7)
Glucose, Bld: 74 mg/dL (ref 65–99)
Potassium: 4.4 mmol/L (ref 3.5–5.3)
Sodium: 135 mmol/L (ref 135–146)
Total Bilirubin: 0.8 mg/dL (ref 0.2–1.2)
Total Protein: 7 g/dL (ref 6.1–8.1)
eGFR: 91 mL/min/{1.73_m2} (ref >=60)

## 2023-03-03 LAB — URINALYSIS, ROUTINE W REFLEX MICROSCOPIC
Bacteria, UA: NONE SEEN /[HPF]
Bilirubin Urine: NEGATIVE
Glucose, UA: NEGATIVE
Hgb urine dipstick: NEGATIVE
Leukocytes,Ua: NEGATIVE
Nitrite: NEGATIVE
Specific Gravity, Urine: 1.024 (ref 1.001–1.035)
Squamous Epithelial / HPF: NONE SEEN /[HPF] (ref <=5)
WBC, UA: NONE SEEN /[HPF] (ref 0–5)
pH: 6 (ref 5.0–8.0)

## 2023-03-03 LAB — VITAMIN B12: Vitamin B-12: >2000 pg/mL — ABNORMAL HIGH (ref 200–1100)

## 2023-03-03 LAB — CBC WITH DIFFERENTIAL/PLATELET
Absolute Lymphocytes: 1738 {cells}/uL (ref 850–3900)
Absolute Monocytes: 1010 {cells}/uL — ABNORMAL HIGH (ref 200–950)
Basophils Absolute: 46 {cells}/uL (ref 0–200)
Basophils Relative: 0.5 %
Eosinophils Absolute: 46 {cells}/uL (ref 15–500)
Eosinophils Relative: 0.5 %
HCT: 45.9 % (ref 38.5–50.0)
Hemoglobin: 15.8 g/dL (ref 13.2–17.1)
MCH: 33.4 pg — ABNORMAL HIGH (ref 27.0–33.0)
MCHC: 34.4 g/dL (ref 32.0–36.0)
MCV: 97 fL (ref 80.0–100.0)
MPV: 9 fL (ref 7.5–12.5)
Monocytes Relative: 11.1 %
Neutro Abs: 6261 {cells}/uL (ref 1500–7800)
Neutrophils Relative %: 68.8 %
Platelets: 326 10*3/uL (ref 140–400)
RBC: 4.73 10*6/uL (ref 4.20–5.80)
RDW: 12 % (ref 11.0–15.0)
Total Lymphocyte: 19.1 %
WBC: 9.1 10*3/uL (ref 3.8–10.8)

## 2023-03-03 LAB — MICROALBUMIN / CREATININE URINE RATIO
Creatinine, Urine: 282 mg/dL (ref 20–320)
Microalb Creat Ratio: 11 mg/g{creat} (ref <30)
Microalb, Ur: 3.1 mg/dL

## 2023-03-03 LAB — HEMOGLOBIN A1C
Hgb A1c MFr Bld: 5.7 %{Hb} — ABNORMAL HIGH (ref <5.7)
Mean Plasma Glucose: 117 mg/dL
eAG (mmol/L): 6.5 mmol/L

## 2023-03-03 LAB — IRON,TIBC AND FERRITIN PANEL
%SAT: 32 % (ref 20–48)
Ferritin: 114 ng/mL (ref 24–380)
Iron: 128 ug/dL (ref 50–180)
TIBC: 406 ug/dL (ref 250–425)

## 2023-03-03 LAB — LIPID PANEL
Cholesterol: 179 mg/dL (ref <200)
HDL: 64 mg/dL (ref >=40)
LDL Cholesterol (Calc): 91 mg/dL
Non-HDL Cholesterol (Calc): 115 mg/dL (ref <130)
Total CHOL/HDL Ratio: 2.8 (calc) (ref <5.0)
Triglycerides: 144 mg/dL (ref <150)

## 2023-03-03 LAB — MAGNESIUM: Magnesium: 2.2 mg/dL (ref 1.5–2.5)

## 2023-03-03 LAB — VITAMIN D 25 HYDROXY (VIT D DEFICIENCY, FRACTURES): Vit D, 25-Hydroxy: 43 ng/mL (ref 30–100)

## 2023-03-03 LAB — TSH: TSH: 4.26 m[IU]/L (ref 0.40–4.50)

## 2023-03-03 LAB — TESTOSTERONE: Testosterone: 385 ng/dL (ref 250–827)

## 2023-03-03 LAB — INSULIN, RANDOM: Insulin: 5.3 u[IU]/mL

## 2023-03-03 LAB — PSA: PSA: 0.28 ng/mL (ref <=4.00)

## 2023-03-03 LAB — MICROSCOPIC MESSAGE

## 2023-03-03 NOTE — Progress Notes (Signed)
 - Test results slightly outside the reference range are not unusual. If there is anything important, I will review this with you,  otherwise it is considered normal test values.  If you have further questions,  please do not hesitate to contact me at the office or via My Chart.   =========================================================================  -  Vitamin B12 level is very high , So you should cut back dose to just take 2 x / week   =========================================================================  -  A1c = 5.7% is again borderline elevated high 12 week average Blood Sugar, So  - Avoid Sweets, Candy & White Stuff   - White Rice, White Potatoes, White Flour  - Breads &  Pasta  =========================================================================  -  PSA - Low - No prostate Cancer - Great   ========================================================================  -  Testosterone  level is borderline low -So plesase be sure taking your Zince which helps raise Testosterone  levels naturally .  Also ,  9 Ways to Naturally Increase Testosterone  Levels  1.   Lose Weight  If you're overweight, shedding the excess pounds may increase your testosterone  levels, according to research presented at the Endocrine Society's 2012 meeting. Overweight men are more likely to have low testosterone  levels to begin with, so this is an important trick to increase your body's testosterone  production when you need it most.  2.   High-Intensity Exercise like Peak Fitness   Short intense exercise has a proven positive effect on increasing testosterone  levels and preventing its decline. That's unlike aerobics or prolonged moderate exercise, which have shown to have negative or no effect on testosterone  levels. Having a whey protein meal after exercise can further enhance the satiety/testosterone -boosting impact (hunger hormones cause the opposite effect on your testosterone  and libido).  Here's a summary of what a typical high-intensity Peak Fitness routine might look like:  Warm up for three minutes   Exercise as hard and fast as you can for 30 seconds. You should feel like you couldn't possibly go on another few seconds   Recover at a slow to moderate pace for 90 seconds   Repeat the high intensity exercise and recovery 7 more times .  3.   Consume Plenty of Zinc  The mineral zinc is important for testosterone  production, and supplementing your diet for as little as six weeks has been shown to cause a marked improvement in testosterone  among men with low levels.1 Likewise, research has shown that restricting dietary sources of zinc leads to a significant decrease in testosterone , while zinc supplementation increases it - and even protects men from exercised-induced reductions in testosterone  levels.  It's estimated that up to 45 percent of adults over the age of 60 may have lower than recommended zinc intakes; even when dietary supplements were added in, an estimated 20-25 percent of older adults still had inadequate zinc intakes, according to a [employer] and Nutrition Examination Survey.4 Your diet is the best source of zinc; along with protein-rich foods like  fish, other good dietary sources of zinc include raw milk, raw cheese, beans, and yogurt or kefir made from raw milk. It can be difficult to obtain enough dietary zinc if you're a vegetarian, and also for meat-eaters as well, largely because of conventional farming methods that rely heavily on chemical fertilizers and pesticides. These chemicals deplete the soil of nutrients ... nutrients like zinc that must be absorbed by plants in order to be passed on to you. In many cases, you may further deplete the nutrients in your food  by the way you prepare it. For most food, cooking it will drastically reduce its levels of nutrients like zinc ... particularly over-cooking, which many people do. If you decide to use a zinc  supplement, stick to a dosage of 50 mg a day, as this is the recommended adult dose. Taking too much zinc can interfere with your body's ability to absorb other minerals, especially copper, and may cause nausea as a side effect.  4.   Strength Training  In addition to Peak Fitness, strength training is also known to boost testosterone  levels, provided you are doing so intensely enough. When strength training to boost testosterone , you'll want to increase the weight and lower your number of reps, and then focus on exercises that work a large number of muscles, such as dead lifts or squats.  You can turbo-charge your weight training by going slower. By slowing down your movement, you're actually turning it into a high-intensity exercise. Super Slow movement allows your muscle, at the microscopic level, to access the maximum number of cross-bridges between the protein filaments that produce movement in the muscle.   5.   Optimize Your Vitamin D  Levels  Vitamin D , a steroid hormone, is essential for the healthy development of the nucleus of the sperm cell, and helps maintain semen quality and sperm count. Vitamin D  also increases levels of testosterone , which may boost libido. In one study, overweight men who were given vitamin D  supplements had a significant increase in testosterone  levels after one year.5   6.   Reduce Stress  When you're under a lot of stress, your body releases high levels of the stress hormone cortisol. This hormone actually blocks the effects of testosterone ,6 presumably because, from a biological standpoint, testosterone -associated behaviors (mating, competing, aggression) may have lowered your chances of survival in an emergency (hence, the fight or flight response is dominant, courtesy of cortisol).  7.   Limit or Eliminate Sugar from Your Diet  Testosterone  levels decrease after you eat sugar, which is likely because the sugar leads to a high insulin  level, another factor  leading to low testosterone .7 Based on USDA estimates, the average American consumes 12 teaspoons of sugar a day, which equates to about TWO TONS of sugar during a lifetime.  8.   Eat Healthy Fats  By healthy, this means not only mono- and polyunsaturated fats, like that found in avocadoes and nuts, but also saturated, as these are essential for building testosterone . Research shows that a diet with less than 40 percent of energy as fat (and that mainly from animal sources, i.e. saturated) lead to a decrease in testosterone  levels. ie eat less animal products - as Meat , poultry and dairy. Experts agree that the ideal diet includes somewhere between 50-70 percent fat.  It's important to understand that your body requires saturated fats from animal and vegetable sources (such as meat, dairy, certain oils, and tropical plants like coconut) for optimal functioning, and if you neglect this important food group in favor of sugar, grains and other starchy carbs, your health and weight are almost guaranteed to suffer. Examples of healthy fats you can eat more of to give your testosterone  levels a boost include: Olives and Olive oil  Coconuts and coconut oil Butter made from raw grass-fed organic milk Raw nuts, such as, almonds or pecans Organic pastured egg yolks Avocados Grass-fed meats Palm oil Unheated organic nut oils  9.   Boost Your Intake of Branch Chain Amino Acids (BCAA) from Foods Like  Whey Protein Research suggests that BCAAs result in higher testosterone  levels, particularly when taken along with resistance training. While BCAAs are available in supplement form, you'll find the highest concentrations of BCAAs like leucine in whey protein. Even when getting leucine from your natural food supply, it's often wasted or used as a building block instead of an anabolic agent. So to create the correct anabolic environment, you need to boost leucine consumption way beyond mere maintenance levels. That  said, keep in mind that using leucine as a free form amino acid can be highly counterproductive as when free form amino acids are artificially administrated, they rapidly enter your circulation while disrupting insulin  function, and impairing your body's glycemic control. Food-based leucine is really the ideal form that can benefit your muscles without side effects.   ========================================================================= =========================================================================  -  Thyroid - Normal - OK   =========================================================================  -  Vitamin D  = 43 - is LOW    - Vitamin D  goal is between 70-100.   - Please make sure that you are taking your Vitamin D  10,000 units / day as directed.   - It is very important as a natural anti-inflammatory and helping the                          immune system protect against viral infections, like Flu  & the Covid    - Also helps hair, skin, and nails, as well as reducing stroke and heart attack risk.   - It helps your bones  &  and helps with mood.  - It also decreases numerous cancer risks, so please                                                                                           take it as directed.   - Low Vit D is associated with a 200-300% higher risk for CANCER   and 200-300% higher risk for HEART   ATTACK  &  STROKE.    - It is also associated with higher death rate at younger ages,   autoimmune diseases like Rheumatoid arthritis, Lupus, Multiple Sclerosis.     - Also many other serious conditions, like depression, Alzheimer's Dementia                                                                             muscle aches, fatigue, fibromyalgia   =========================================================================  -  Iron levels are Normal & OK   =========================================================================  -  Chol = 179    &  LDL = 91   - Both  Excellent   - Very low risk for Heart Attack  / Stroke  =========================================================================  -  All Else - CBC - Kidneys - Electrolytes - Liver - Magnesium & Thyroid    - all  Normal /  OK  =========================================================================  -  Referral was made to GI for you Overdue Colonoscopy since 2020  =========================================================================

## 2023-03-15 ENCOUNTER — Other Ambulatory Visit: Payer: Self-pay | Admitting: Internal Medicine

## 2023-03-15 DIAGNOSIS — G44019 Episodic cluster headache, not intractable: Secondary | ICD-10-CM

## 2023-06-08 ENCOUNTER — Ambulatory Visit: Payer: 59 | Admitting: Nurse Practitioner

## 2023-08-28 ENCOUNTER — Emergency Department (HOSPITAL_COMMUNITY)

## 2023-08-28 ENCOUNTER — Other Ambulatory Visit: Payer: Self-pay

## 2023-08-28 ENCOUNTER — Inpatient Hospital Stay (HOSPITAL_COMMUNITY)

## 2023-08-28 ENCOUNTER — Inpatient Hospital Stay (HOSPITAL_COMMUNITY)
Admission: EM | Admit: 2023-08-28 | Discharge: 2023-08-31 | DRG: 164 | Disposition: A | Discharge status: Home / Self Care | Attending: Thoracic Surgery (Cardiothoracic Vascular Surgery) | Admitting: Thoracic Surgery (Cardiothoracic Vascular Surgery)

## 2023-08-28 ENCOUNTER — Encounter (HOSPITAL_COMMUNITY): Payer: Self-pay

## 2023-08-28 DIAGNOSIS — J9311 Primary spontaneous pneumothorax: Secondary | ICD-10-CM | POA: Diagnosis not present

## 2023-08-28 DIAGNOSIS — Z9889 Other specified postprocedural states: Secondary | ICD-10-CM

## 2023-08-28 DIAGNOSIS — Z79890 Hormone replacement therapy (postmenopausal): Secondary | ICD-10-CM | POA: Diagnosis not present

## 2023-08-28 DIAGNOSIS — K5900 Constipation, unspecified: Secondary | ICD-10-CM | POA: Diagnosis present

## 2023-08-28 DIAGNOSIS — J449 Chronic obstructive pulmonary disease, unspecified: Secondary | ICD-10-CM | POA: Diagnosis not present

## 2023-08-28 DIAGNOSIS — I10 Essential (primary) hypertension: Secondary | ICD-10-CM | POA: Diagnosis present

## 2023-08-28 DIAGNOSIS — J439 Emphysema, unspecified: Secondary | ICD-10-CM | POA: Diagnosis present

## 2023-08-28 DIAGNOSIS — Z79899 Other long term (current) drug therapy: Secondary | ICD-10-CM | POA: Diagnosis not present

## 2023-08-28 DIAGNOSIS — J930 Spontaneous tension pneumothorax: Principal | ICD-10-CM | POA: Diagnosis present

## 2023-08-28 DIAGNOSIS — J9382 Other air leak: Secondary | ICD-10-CM | POA: Diagnosis present

## 2023-08-28 DIAGNOSIS — Z7982 Long term (current) use of aspirin: Secondary | ICD-10-CM | POA: Diagnosis not present

## 2023-08-28 DIAGNOSIS — Z888 Allergy status to other drugs, medicaments and biological substances status: Secondary | ICD-10-CM | POA: Diagnosis not present

## 2023-08-28 DIAGNOSIS — J9811 Atelectasis: Secondary | ICD-10-CM | POA: Diagnosis present

## 2023-08-28 DIAGNOSIS — Z6826 Body mass index (BMI) 26.0-26.9, adult: Secondary | ICD-10-CM

## 2023-08-28 DIAGNOSIS — Z823 Family history of stroke: Secondary | ICD-10-CM | POA: Diagnosis not present

## 2023-08-28 DIAGNOSIS — N179 Acute kidney failure, unspecified: Secondary | ICD-10-CM | POA: Diagnosis not present

## 2023-08-28 DIAGNOSIS — E46 Unspecified protein-calorie malnutrition: Secondary | ICD-10-CM | POA: Diagnosis present

## 2023-08-28 DIAGNOSIS — E785 Hyperlipidemia, unspecified: Secondary | ICD-10-CM | POA: Diagnosis present

## 2023-08-28 DIAGNOSIS — E039 Hypothyroidism, unspecified: Secondary | ICD-10-CM | POA: Diagnosis present

## 2023-08-28 DIAGNOSIS — E871 Hypo-osmolality and hyponatremia: Secondary | ICD-10-CM | POA: Diagnosis present

## 2023-08-28 DIAGNOSIS — F17210 Nicotine dependence, cigarettes, uncomplicated: Secondary | ICD-10-CM | POA: Diagnosis present

## 2023-08-28 DIAGNOSIS — G44019 Episodic cluster headache, not intractable: Secondary | ICD-10-CM

## 2023-08-28 DIAGNOSIS — F17200 Nicotine dependence, unspecified, uncomplicated: Secondary | ICD-10-CM

## 2023-08-28 DIAGNOSIS — R7303 Prediabetes: Secondary | ICD-10-CM | POA: Diagnosis present

## 2023-08-28 LAB — CBC
HCT: 45.4 % (ref 39.0–52.0)
Hemoglobin: 16.1 g/dL (ref 13.0–17.0)
MCH: 33.8 pg (ref 26.0–34.0)
MCHC: 35.5 g/dL (ref 30.0–36.0)
MCV: 95.2 fL (ref 80.0–100.0)
Platelets: 371 K/uL (ref 150–400)
RBC: 4.77 MIL/uL (ref 4.22–5.81)
RDW: 12.2 % (ref 11.5–15.5)
WBC: 9.1 K/uL (ref 4.0–10.5)
nRBC: 0 % (ref 0.0–0.2)

## 2023-08-28 LAB — COMPREHENSIVE METABOLIC PANEL WITH GFR
ALT: 23 U/L (ref 0–44)
AST: 19 U/L (ref 15–41)
Albumin: 3.9 g/dL (ref 3.5–5.0)
Alkaline Phosphatase: 62 U/L (ref 38–126)
Anion gap: 9 (ref 5–15)
BUN: 7 mg/dL — ABNORMAL LOW (ref 8–23)
CO2: 21 mmol/L — ABNORMAL LOW (ref 22–32)
Calcium: 9.5 mg/dL (ref 8.9–10.3)
Chloride: 106 mmol/L (ref 98–111)
Creatinine, Ser: 0.75 mg/dL (ref 0.61–1.24)
GFR, Estimated: >60 mL/min (ref >60)
Glucose, Bld: 109 mg/dL — ABNORMAL HIGH (ref 70–99)
Potassium: 4.3 mmol/L (ref 3.5–5.1)
Sodium: 136 mmol/L (ref 135–145)
Total Bilirubin: 0.9 mg/dL (ref 0.0–1.2)
Total Protein: 7.1 g/dL (ref 6.5–8.1)

## 2023-08-28 LAB — HIV ANTIBODY (ROUTINE TESTING W REFLEX): HIV Screen 4th Generation wRfx: NONREACTIVE

## 2023-08-28 LAB — BRAIN NATRIURETIC PEPTIDE: B Natriuretic Peptide: 35.5 pg/mL (ref 0.0–100.0)

## 2023-08-28 LAB — TROPONIN I (HIGH SENSITIVITY)
Troponin I (High Sensitivity): 6 ng/L (ref <18)
Troponin I (High Sensitivity): 7 ng/L (ref <18)

## 2023-08-28 MED ORDER — MORPHINE SULFATE (PF) 4 MG/ML IV SOLN
4.0000 mg | Freq: Once | INTRAVENOUS | Status: AC
  Administered 2023-08-28: 4 mg via INTRAVENOUS
  Filled 2023-08-28: qty 1

## 2023-08-28 MED ORDER — ACETAMINOPHEN 500 MG PO TABS
1000.0000 mg | ORAL_TABLET | Freq: Three times a day (TID) | ORAL | Status: DC
  Administered 2023-08-28 – 2023-08-29 (×4): 1000 mg via ORAL
  Filled 2023-08-28 (×4): qty 2

## 2023-08-28 MED ORDER — ONDANSETRON HCL 4 MG/2ML IJ SOLN
4.0000 mg | Freq: Once | INTRAMUSCULAR | Status: AC
  Administered 2023-08-28: 4 mg via INTRAVENOUS
  Filled 2023-08-28: qty 2

## 2023-08-28 MED ORDER — LIDOCAINE-EPINEPHRINE (PF) 2 %-1:200000 IJ SOLN
10.0000 mL | Freq: Once | INTRAMUSCULAR | Status: AC
  Administered 2023-08-28: 10 mL
  Filled 2023-08-28: qty 20

## 2023-08-28 MED ORDER — OXYCODONE HCL 5 MG PO TABS
5.0000 mg | ORAL_TABLET | ORAL | Status: DC | PRN
  Administered 2023-08-28: 5 mg via ORAL
  Filled 2023-08-28: qty 1

## 2023-08-28 MED ORDER — GABAPENTIN 300 MG PO CAPS
300.0000 mg | ORAL_CAPSULE | Freq: Three times a day (TID) | ORAL | Status: DC
  Administered 2023-08-28 – 2023-08-31 (×9): 300 mg via ORAL
  Filled 2023-08-28 (×10): qty 1

## 2023-08-28 MED ORDER — NICOTINE POLACRILEX 2 MG MT GUM: 2.0000 mg | CHEWING_GUM | OROMUCOSAL | Status: DC | PRN

## 2023-08-28 MED ORDER — METHOCARBAMOL 500 MG PO TABS
500.0000 mg | ORAL_TABLET | Freq: Three times a day (TID) | ORAL | Status: DC
  Administered 2023-08-28 – 2023-08-31 (×10): 500 mg via ORAL
  Filled 2023-08-28 (×10): qty 1

## 2023-08-28 MED ORDER — SODIUM CHLORIDE 0.9% FLUSH
10.0000 mL | Freq: Three times a day (TID) | INTRAVENOUS | Status: DC
  Administered 2023-08-29 (×2): 10 mL via INTRAPLEURAL

## 2023-08-28 MED ORDER — OXYCODONE HCL 5 MG PO TABS: 5.0000 mg | ORAL_TABLET | ORAL | Status: DC | PRN

## 2023-08-28 MED ORDER — NICOTINE 14 MG/24HR TD PT24
14.0000 mg | MEDICATED_PATCH | Freq: Every day | TRANSDERMAL | Status: DC
  Administered 2023-08-28 – 2023-08-31 (×4): 14 mg via TRANSDERMAL
  Filled 2023-08-28 (×4): qty 1

## 2023-08-28 MED ORDER — OXYCODONE HCL 5 MG PO TABS
10.0000 mg | ORAL_TABLET | ORAL | Status: DC | PRN
  Administered 2023-08-28 – 2023-08-29 (×4): 10 mg via ORAL
  Filled 2023-08-28 (×4): qty 2

## 2023-08-28 MED ORDER — LEVOTHYROXINE SODIUM 88 MCG PO TABS
88.0000 ug | ORAL_TABLET | Freq: Every day | ORAL | Status: DC
  Administered 2023-08-29 – 2023-08-31 (×3): 88 ug via ORAL
  Filled 2023-08-28 (×3): qty 1

## 2023-08-28 MED ORDER — VERAPAMIL HCL ER 240 MG PO TBCR
240.0000 mg | EXTENDED_RELEASE_TABLET | Freq: Every day | ORAL | Status: DC
  Administered 2023-08-28 – 2023-08-29 (×2): 240 mg via ORAL
  Filled 2023-08-28 (×3): qty 1

## 2023-08-28 NOTE — Consult Note (Signed)
 NAME:  Vander Kueker, MRN:  [id number], DOB:  [birth year], LOS: 0 ADMISSION DATE:  08/28/2023, CONSULTATION DATE:  08/28/23 REFERRING MD:  Long, CHIEF COMPLAINT:  ptx   History of Present Illness:  63 yo M PMH HTN, HLD, tobacco use, prior ptx (spontaneous R ptx late June 2025 req chest tube & admission to [facility] x3d, prior hx spontaneous L ptx when he was 17)  who presented to ED 7/11 w CC sob. Started 7/10 overnight and progressed. Felt like prior ptx.  In ED CXR w large R ptx. Pigtail placed.  PCCM called for mgmnt of  chest tube    Pertinent  Medical History  Tobacco use Spontaneous ptx HLD HTN Hypothyroidism Cluster HA   Significant Hospital Events: Including procedures, antibiotic start and stop dates in addition to other pertinent events   7/11 pigtail for R spontaneous ptx   Interim History / Subjective:  Chest tube placed by EM  Objective    Blood pressure (!) 149/101, pulse 81, temperature 98.2 F (36.8 C), temperature source Oral, resp. rate 20, height 5' 10 (1.778 m), weight 83.8 kg, SpO2 94%.       No intake or output data in the 24 hours ending 08/28/23 0921 Filed Weights   08/28/23 0543  Weight: 83.8 kg    Examination: General: wdwn middle aged M NAD  HENT: NCAT  Lungs: R pigtail with continuous air leak. 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 Post w good re-expansion, some residual R ptx  P -CT chest & will reach out to [facility] for possible surgical eval  -routine chest tube care, cont to sxn  -tobacco cessation support   Best Practice (right click and Reselect all SmartList Selections daily)   Per primary    Labs   CBC: Recent Labs  Lab 08/28/23 0550  WBC 9.1  HGB 16.1  HCT 45.4  MCV 95.2  PLT 371    Basic Metabolic Panel: Recent Labs  Lab 08/28/23 0550  NA 136  K 4.3  CL 106  CO2 21*  GLUCOSE 109*  BUN 7*  CREATININE 0.75  CALCIUM 9.5   GFR: Estimated Creatinine Clearance: 95.1 mL/min (by C-G formula based on SCr of 0.75 mg/dL). Recent Labs  Lab 08/28/23 0550  WBC 9.1    Liver Function Tests: Recent Labs  Lab 08/28/23 0550  AST 19  ALT 23  ALKPHOS 62  BILITOT 0.9  PROT 7.1  ALBUMIN 3.9   No results for input(s): LIPASE, AMYLASE in the last 168 hours. No results for input(s): AMMONIA in the last 168 hours.  ABG No results found for: PHART, PCO2ART, PO2ART, HCO3, TCO2, ACIDBASEDEF, O2SAT   Coagulation Profile: No results for input(s): INR, PROTIME in the last 168 hours.  Cardiac Enzymes: No results for input(s): CKTOTAL, CKMB, CKMBINDEX, TROPONINI in the last 168 hours.  HbA1C: Hgb A1c MFr Bld  Date/Time Value Ref Range Status  03/02/2023 03:31 PM 5.7 (H) <5.7 % of total Hgb Final    Comment:    For someone without known diabetes, a hemoglobin  A1c value between 5.7% and 6.4% is consistent with prediabetes and should be confirmed with a  follow-up test. . For someone with known diabetes, a value <7% indicates that their diabetes is well controlled. A1c targets should be individualized based on duration of diabetes, age, comorbid conditions, and other considerations. . This assay result is consistent with an increased risk of diabetes. . Currently, no consensus exists regarding use of hemoglobin A1c for diagnosis of diabetes for children. SABRA   02/27/2022 03:41 PM 5.5 <5.7 % of total Hgb Final    Comment:    For the purpose of screening for the presence of diabetes: . <5.7%       Consistent with the absence of diabetes 5.7-6.4%    Consistent with increased risk for diabetes             (prediabetes) >  or =6.5%  Consistent with diabetes . This assay result is consistent with a decreased risk of diabetes. . Currently, no consensus exists regarding use of hemoglobin A1c for diagnosis of diabetes in children. . According to American Diabetes Association (ADA) guidelines, hemoglobin A1c <7.0% represents optimal control in non-pregnant diabetic patients. Different metrics may apply to specific patient populations.  Standards of Medical Care in Diabetes(ADA). .     CBG: No results for input(s): GLUCAP in the last 168 hours.  Review of Systems:   + for SOB   Past Medical History:  He,  has a past medical history of Cluster headache, Hyperlipidemia, Hypertension, Prediabetes, and Vitamin D  deficiency.   Surgical History:   Past Surgical History:  Procedure Laterality Date   APPENDECTOMY  1972   INCISE AND DRAIN ABCESS  2008    right shoulder   PLEURAL SCARIFICATION Left 1979   chest tube thoracostomy     Social History:   reports that he has been smoking cigarettes. He started smoking about 50 years ago. He has a 37.9 pack-year smoking history. He has never used smokeless tobacco. He reports current alcohol use. He reports that he does not use drugs.   Family History:  His family history includes Stroke in his father.   Allergies Allergies  Allergen Reactions   Topamax [Topiramate] Other (See Comments)    Confusion Brain fog     Home Medications  Prior to Admission medications   Medication Sig Start Date End Date Taking? Authorizing Provider  acyclovir (ZOVIRAX) 200 MG capsule as needed.  09/10/13   [provider]  aspirin 81 MG tablet Take 81 mg by mouth daily.     [provider]  cetirizine (ZYRTEC) 10 MG tablet Take 10 mg by mouth daily.    [provider]  Cholecalciferol (VITAMIN D  PO) Take 10,000 Int'l Units by mouth daily.    [provider]  cyanocobalamin 1000 MCG tablet Take 1,000 mcg by mouth daily.    [provider]  Flaxseed, Linseed, (FLAXSEED OIL PO) Take by mouth daily.    [provider]  levothyroxine  (SYNTHROID ) 88 MCG tablet Take 1 tablet (88 mcg total) by mouth daily. 12/23/22 12/23/23  Wilkinson, Dana E, FNP  meloxicam  (MOBIC ) 15 MG tablet TAKE 1/2 TO 1 TAB DAILY WITH FOOD FOR PAIN/INFLAMMATION LIMIT TO 5 TABS/WEEK TO AVOID KIDNEY DAMAGE 02/10/22   Tonita Fallow, MD  Omega-3 Fatty Acids (FISH OIL PO) Take by mouth daily.    [provider]  Red Yeast Rice Extract (RED YEAST RICE PO) Take by mouth daily.    [provider]  sildenafil  (VIAGRA ) 100 MG tablet Take  1/2 to 1 tablet  Daily  if Needed for  MANDY 04/02/21   Tonita Fallow, MD  verapamil  (CALAN -SR) 240 MG CR tablet TAKE 1 TABLET DAILY FOR BP & CLUSTER HA PROPHYLAXIS 03/16/23   Cranford, Tonya, NP  Zinc 50 MG TABS Take by mouth daily.    [provider]     Critical care time: na      Ronnald Gave MSN, AGACNP-BC [facility] Amion for pager 08/28/2023, 9:21 AM

## 2023-08-28 NOTE — ED Provider Notes (Signed)
 Medical screening examination/treatment/procedure(s) were conducted as a shared visit with non-physician practitioner(s) and myself.  I personally evaluated the patient during the encounter.  Patient presents emergency department with shortness of breath and right-sided chest pain similar to spontaneous pneumothorax he experienced during a hospitalization outside of our system 3 weeks ago.  He is in no distress but requiring supplemental oxygen.   CHEST TUBE INSERTION  Date/Time: 08/28/2023 7:09 AM  Performed by: Darra Fonda MATSU, MD Authorized by: Darra Fonda MATSU, MD   Consent:    Consent obtained:  Emergent situation   Consent given by:  Patient   Risks, benefits, and alternatives were discussed: yes     Risks discussed:  Bleeding, damage to surrounding structures, incomplete drainage, infection, nerve damage and pain   Alternatives discussed:  No treatment Universal protocol:    Patient identity confirmed:  Verbally with patient Pre-procedure details:    Skin preparation:  Chlorhexidine  Sedation:    Sedation type:  None Anesthesia:    Anesthesia method:  Local infiltration   Local anesthetic:  Lidocaine  2% WITH epi Procedure details:    Placement location:  R lateral   Scalpel size:  11   Dissection instrument: needle.   Ultrasound guidance: no     Tension pneumothorax: yes     Tube connected to:  Suction   Drainage characteristics:  Air only   Suture material:  2-0 silk   Dressing:  4x4 sterile gauze Post-procedure details:    Post-insertion x-ray findings: tube in good position     Procedure completion:  Tolerated well, no immediate complications  CRITICAL CARE Performed by: Fonda MATSU Darra Total critical care time: 35 minutes Critical care time was exclusive of separately billable procedures and treating other patients. Critical care was necessary to treat or prevent imminent or life-threatening deterioration. Critical care was time spent personally by me on the following  activities: development of treatment plan with patient and/or surrogate as well as nursing, discussions with consultants, evaluation of patient's response to treatment, examination of patient, obtaining history from patient or surrogate, ordering and performing treatments and interventions, ordering and review of laboratory studies, ordering and review of radiographic studies, pulse oximetry and re-evaluation of patient's condition.  Fonda Darra, MD Emergency Medicine   Fonda Darra, MD Emergency Medicine    Rian Koon, Fonda MATSU, MD 08/29/23 [phone]

## 2023-08-28 NOTE — ED Notes (Signed)
 Pt transported to CT ?

## 2023-08-28 NOTE — ED Provider Notes (Signed)
 [name] EMERGENCY DEPARTMENT AT [facility] Provider Note   CSN: [id number] Arrival date & time: 08/28/23  0541     Patient presents with: Shortness of Breath   Keith Castro is a 63 y.o. male.  With past medical history of hyperlipidemia, hypertension, prediabetes, smoking history presents to emergency room with complaint of shortness of breath and chest pain.  Patient reports he woke up this morning about 4 hours ago with right sided chest pain and shortness of breath.  He was hypoxic and he is doing better on 2 L nasal cannula.  He reports he had spontaneous pneumothorax 3 weeks ago and that this feels similar.  He denies any cough fever or swelling in feet and ankles.    Shortness of Breath      Prior to Admission medications   Medication Sig Start Date End Date Taking? Authorizing Provider  acyclovir (ZOVIRAX) 200 MG capsule as needed.  09/10/13   [provider]  aspirin 81 MG tablet Take 81 mg by mouth daily.     [provider]  cetirizine (ZYRTEC) 10 MG tablet Take 10 mg by mouth daily.    [provider]  Cholecalciferol (VITAMIN D  PO) Take 10,000 Int'l Units by mouth daily.    [provider]  cyanocobalamin 1000 MCG tablet Take 1,000 mcg by mouth daily.    [provider]  Flaxseed, Linseed, (FLAXSEED OIL PO) Take by mouth daily.    [provider]  levothyroxine  (SYNTHROID ) 88 MCG tablet Take 1 tablet (88 mcg total) by mouth daily. 12/23/22 12/23/23  Wilkinson, Dana E, FNP  meloxicam  (MOBIC ) 15 MG tablet TAKE 1/2 TO 1 TAB DAILY WITH FOOD FOR PAIN/INFLAMMATION LIMIT TO 5 TABS/WEEK TO AVOID KIDNEY DAMAGE 02/10/22   Tonita Fallow, MD  Omega-3 Fatty Acids (FISH OIL PO) Take by mouth daily.    [provider]  Red Yeast Rice Extract (RED YEAST RICE PO) Take by mouth daily.    [provider]  sildenafil  (VIAGRA ) 100 MG tablet Take  1/2 to 1 tablet  Daily  if Needed for XXXX 04/02/21    Tonita Fallow, MD  verapamil  (CALAN -SR) 240 MG CR tablet TAKE 1 TABLET DAILY FOR BP & CLUSTER HA PROPHYLAXIS 03/16/23   Cranford, Tonya, NP  Zinc 50 MG TABS Take by mouth daily.    [provider]    Allergies: Topamax [topiramate]    Review of Systems  Respiratory:  Positive for shortness of breath.     Updated Vital Signs BP (!) 144/105   Pulse 92   Temp 98.2 F (36.8 C) (Oral)   Resp 20   Ht 5' 10 (1.778 m)   Wt 83.8 kg   SpO2 100%   BMI 26.51 kg/m   Physical Exam Vitals and nursing note reviewed.  Constitutional:      General: He is not in acute distress.    Appearance: He is not toxic-appearing.  HENT:     Head: Normocephalic and atraumatic.  Eyes:     General: No scleral icterus.    Conjunctiva/sclera: Conjunctivae normal.  Cardiovascular:     Rate and Rhythm: Normal rate and regular rhythm.     Pulses: Normal pulses.     Heart sounds: Normal heart sounds.  Pulmonary:     Effort: Pulmonary effort is normal. No respiratory distress.     Breath sounds: Normal breath sounds.     Comments: Decreased breath sounds throughout right lung. Abdominal:     General:  Abdomen is flat. Bowel sounds are normal.     Palpations: Abdomen is soft.     Tenderness: There is no abdominal tenderness.  Skin:    General: Skin is warm and dry.     Findings: No lesion.  Neurological:     General: No focal deficit present.     Mental Status: He is alert and oriented to person, place, and time. Mental status is at baseline.     (all labs ordered are listed, but only abnormal results are displayed) Labs Reviewed  COMPREHENSIVE METABOLIC PANEL WITH GFR  CBC  BRAIN NATRIURETIC PEPTIDE  TROPONIN I (HIGH SENSITIVITY)    EKG: None  Radiology: DG Chest 1 View Result Date: 08/28/2023 CLINICAL DATA:  Chest pain. Patient says it feels like is lung is collapsed. History of pneumothorax. EXAM: CHEST  1 VIEW COMPARISON:  06/19/2008 FINDINGS: Large right-sided pneumothorax  with shift of cardiomediastinal anatomy to the left. Left lung clear. Cardiopericardial silhouette is at upper limits of normal for size. No substantial pleural fluid. No acute bony abnormality. IMPRESSION: Large right-sided pneumothorax with shift of cardiomediastinal anatomy to the left concerning for tension pneumothorax. Critical Value/emergent results were called by telephone at the time of interpretation on 08/28/2023 at 6:35 am to provider JOSHUA LONG , who verbally acknowledged these results. Electronically Signed   By: Camellia Candle M.D.   On: 08/28/2023 06:37     Procedures   Medications Ordered in the ED - No data to display                                  Medical Decision Making Amount and/or Complexity of Data Reviewed Labs: ordered. Radiology: ordered.  Risk Prescription drug management.   This patient presents to the ED for concern of chest pain, this involves an extensive number of treatment options, and is a complaint that carries with it a high risk of complications and morbidity.  The differential diagnosis includes ACS, pneumonia, pneumothorax, PE, aortic dissection   Co morbidities that complicate the patient evaluation  History of smoking    Lab Tests:  I personally interpreted labs.  The pertinent results include:  CBC, CMP, BNP, Troponin    Imaging Studies ordered:  I ordered imaging studies including chest x-ray I independently visualized and interpreted imaging which showed right pneumothorax I agree with the radiologist interpretation Waiting for repeat chest x-ray    Cardiac Monitoring: / EKG:  The patient was maintained on a cardiac monitor.    Consultations Obtained:  I requested consultation with the pulmonology,  and discussed lab and imaging findings as well as pertinent plan - they will see patient in consult.  Will admit to medicine.    Problem List / ED Course / Critical interventions / Medication management  Patient reports to  emergency room with complaint of right-sided chest pain and shortness of breath.  Reports has been ongoing for about 2 hours.  He is hemodynamically stable and requiring oxygen.  Chest x-ray shows large tension pneumothorax.  Discussed with pulmonology who agrees to see patient in consult.  Attending Dr. Darra placed chest tube.  Waiting for CBC, CMP, troponin and BNP as well as repeat chest x-ray but patient almost passed out but signed off to [place] [facility] for admission and labs.  I ordered medication including morphine   Reevaluation of the patient after these medicines showed that the patient improved I have reviewed the patients  home medicines and have made adjustments as needed      Final diagnoses:  None    ED Discharge Orders     None          Shermon Warren SAILOR, PA-C 08/28/23 0708    Darra Fonda MATSU, MD 08/29/23 [phone]

## 2023-08-28 NOTE — H&P (Signed)
 History and Physical    Patient: Keith Castro FMW:[id number] DOB: [birth year] DOA: 08/28/2023 DOS: the patient was seen and examined on 08/28/2023 PCP: Tonita Fallow, MD  Patient coming from: Home  Chief Complaint:  Chief Complaint  Patient presents with   Shortness of Breath   HPI: Keith Castro is a 63 y.o. male with medical history significant of HTN, HLD, tobacco use, and cluster headaches p/w recurrent R tension PTX.  Pt states that he was in his USOH until last night at 0100 when pain in his R shoulder woke him up from sleep. He rated the pain at 3/10 and says that it initially felt like a crick or sore muscle. He was eventually able to go back to sleep by 0230, but woke up 30 mins later with worse pain, now 8/10, and SOB; as such, he activated EMS and was BIBA to the ED. Of note, pt has a recent h/o R PTX 2 weeks ago while in [facility] that was treated with a chest tube; additionally, he had a L PTX at the age of 65.  In the ED, pt hypertensive and tachypneic on RA. Labs unremarkable. CT chest showed R PTX. EDP placed pigtail catheter, consulted  pulm, and admitted the pt to medicine for ongoing care.  Review of Systems: As mentioned in the history of present illness. All other systems reviewed and are negative. Past Medical History:  Diagnosis Date   Cluster headache    Hyperlipidemia    Hypertension    Prediabetes    Vitamin D  deficiency    Past Surgical History:  Procedure Laterality Date   APPENDECTOMY  1972   INCISE AND DRAIN ABCESS  2008    right shoulder   PLEURAL SCARIFICATION Left 1979   chest tube thoracostomy   Social History:  reports that he has been smoking cigarettes. He started smoking about 50 years ago. He has a 37.9 pack-year smoking history. He has never used smokeless tobacco. He reports current alcohol use. He reports that he does not use drugs.  Allergies  Allergen Reactions   Topamax [Topiramate] Other (See Comments)     Confusion Brain fog    Family History  Problem Relation Age of Onset   Stroke Father     Prior to Admission medications   Medication Sig Start Date End Date Taking? Authorizing Provider  Ascorbic Acid (VITAMIN C PO) Take 1 tablet by mouth daily.   Yes [provider]  aspirin 81 MG tablet Take 81 mg by mouth daily.    Yes [provider]  Cholecalciferol (VITAMIN D -3 PO) Take 1 capsule by mouth daily.   Yes [provider]  ibuprofen (ADVIL) 200 MG tablet Take 400 mg by mouth daily as needed for headache or moderate pain (pain score 4-6).   Yes [provider]  levothyroxine  (SYNTHROID ) 88 MCG tablet Take 1 tablet (88 mcg total) by mouth daily. 12/23/22 12/23/23 Yes Wilkinson, Dana E, FNP  meloxicam  (MOBIC ) 15 MG tablet TAKE 1/2 TO 1 TAB DAILY WITH FOOD FOR PAIN/INFLAMMATION LIMIT TO 5 TABS/WEEK TO AVOID KIDNEY DAMAGE Patient taking differently: Take 15 mg by mouth every other day. 02/10/22  Yes Tonita Fallow, MD  MILK THISTLE PO Take 1 tablet by mouth daily.   Yes [provider]  Multiple Vitamins-Minerals (ZINC PO) Take 1 tablet by mouth daily.   Yes [provider]  verapamil  (CALAN -SR) 240 MG CR tablet TAKE 1 TABLET DAILY FOR BP & CLUSTER HA PROPHYLAXIS 03/16/23  Yes  Laurice President, NP    Physical Exam: Vitals:   08/28/23 0543 08/28/23 0547 08/28/23 0700  BP:  (!) 144/105 (!) 149/101  Pulse:  92 81  Resp:  20 20  Temp:  98.2 F (36.8 C)   TempSrc:  Oral   SpO2:  100% 94%  Weight: 83.8 kg    Height: 5' 10 (1.778 m)     General: Alert, oriented x3, resting comfortably in no acute distress Respiratory: Lungs clear to auscultation bilaterally with decreased breath sounds over RLL; no wheezing Cardiovascular: Regular rate and rhythm w/o m/r/g   Data Reviewed:  Lab Results  Component Value Date   WBC 9.1 08/28/2023   HGB 16.1 08/28/2023   HCT 45.4 08/28/2023   MCV 95.2 08/28/2023   PLT 371 08/28/2023   Lab  Results  Component Value Date   GLUCOSE 109 (H) 08/28/2023   CALCIUM 9.5 08/28/2023   NA 136 08/28/2023   K 4.3 08/28/2023   CO2 21 (L) 08/28/2023   CL 106 08/28/2023   BUN 7 (L) 08/28/2023   CREATININE 0.75 08/28/2023   Lab Results  Component Value Date   ALT 23 08/28/2023   AST 19 08/28/2023   ALKPHOS 62 08/28/2023   BILITOT 0.9 08/28/2023   No results found for: INR, PROTIME  Radiology: CT CHEST WO CONTRAST Result Date: 08/28/2023 CLINICAL DATA:  Spontaneous pneumothorax.  Chest tube placement EXAM: CT CHEST WITHOUT CONTRAST TECHNIQUE: Multidetector CT imaging of the chest was performed following the standard protocol without IV contrast. RADIATION DOSE REDUCTION: This exam was performed according to the departmental dose-optimization program which includes automated exposure control, adjustment of the mA and/or kV according to patient size and/or use of iterative reconstruction technique. COMPARISON:  Radiograph 08/28/2023. FINDINGS: Cardiovascular: No significant vascular findings. Normal heart size. No pericardial effusion. Mediastinum/Nodes: No mediastinal shift. Trachea is midline. Small mediastinal lymph nodes not pathologic by size criteria. Lungs/Pleura: The RIGHT pneumothorax increased in volume. Pneumothorax occupies approximately 50% of the RIGHT hemithorax volume. Pigtail chest tube catheter in the anterior RIGHT pleural space. Atelectasis at the RIGHT lung base. Upper Abdomen: Limited view of the liver, kidneys, pancreas are unremarkable. Normal adrenal glands. Musculoskeletal: No aggressive osseous lesion. IMPRESSION: 1. Increase in volume large RIGHT pneumothorax. 2. RIGHT chest tube appears in appropriate position. Electronically Signed   By: [name] M.D.   On: 08/28/2023 09:25   DG Chest Portable 1 View Result Date: 08/28/2023 EXAM: 1 VIEW XRAY OF THE CHEST 08/28/2023 07:11:04 AM COMPARISON: None available. CLINICAL HISTORY: Chest tube placed for large  right-sided pneumothorax with shift of cardiomediastinal anatomy to the left concerning for tension pneumothorax. FINDINGS: LUNGS AND PLEURA: A right-sided pigtail catheter chest tube is now in place. The right lung is re-expanded. Minimal right pneumothorax remains. Basilar atelectasis is noted. The left lung is clear. HEART AND MEDIASTINUM: No acute abnormality of the cardiac and mediastinal silhouettes. BONES AND SOFT TISSUES: No acute osseous abnormality. IMPRESSION: 1. Right lung re-expanded with minimal residual right pneumothorax and basilar atelectasis. 2. Right-sided pigtail catheter in place. Electronically signed by: Lonni Necessary MD 08/28/2023 07:30 AM EDT RP Workstation: [id number]   DG Chest 1 View Result Date: 08/28/2023 CLINICAL DATA:  Chest pain. Patient says it feels like is lung is collapsed. History of pneumothorax. EXAM: CHEST  1 VIEW COMPARISON:  06/19/2008 FINDINGS: Large right-sided pneumothorax with shift of cardiomediastinal anatomy to the left. Left lung clear. Cardiopericardial silhouette is at upper limits of normal for size. No substantial pleural  fluid. No acute bony abnormality. IMPRESSION: Large right-sided pneumothorax with shift of cardiomediastinal anatomy to the left concerning for tension pneumothorax. Critical Value/emergent results were called by telephone at the time of interpretation on 08/28/2023 at 6:35 am to provider JOSHUA LONG , who verbally acknowledged these results. Electronically Signed   By: Camellia Candle M.D.   On: 08/28/2023 06:37    Assessment and Plan: 40M h/o HTN, HLD, hypothyroidism, tobacco use, and cluster headaches p/w recurrent R tension PTX.  Spontaneous R PTX H/o recurrent PTX -Pulm consulted; apprec eval/recs; agree w/ CT and continuing suction -CVTS consulted; apprec eval/recs -Multimodal pain control: Tylenol  1g TID, robaxin  500mg  TID, gabapentin  300mg  TID, and oxycodone  5-10mg  q4h prn  HTN -PTA verapamil  240mg   daily  Hypothyroid -PTA synthroid   Tobacco use -Nicotine  patch daily and gum prn   Advance Care Planning:   Code Status: Full Code   Consults: Pulm  Family Communication: Wife  Severity of Illness: The appropriate patient status for this patient is INPATIENT. Inpatient status is judged to be reasonable and necessary in order to provide the required intensity of service to ensure the patient's safety. The patient's presenting symptoms, physical exam findings, and initial radiographic and laboratory data in the context of their chronic comorbidities is felt to place them at high risk for further clinical deterioration. Furthermore, it is not anticipated that the patient will be medically stable for discharge from the hospital within 2 midnights of admission.   * I certify that at the point of admission it is my clinical judgment that the patient will require inpatient hospital care spanning beyond 2 midnights from the point of admission due to high intensity of service, high risk for further deterioration and high frequency of surveillance required.*   ------- I spent 55 minutes reviewing previous labs/notes, obtaining separate history at the bedside, counseling/discussing the treatment plan outlined above, ordering medications/tests, and performing clinical documentation.  Author: Marsha Ada, MD 08/28/2023 8:57 AM  For on call review [email or web address].

## 2023-08-28 NOTE — ED Provider Notes (Signed)
 Spontaneous ptx a few hrs ago.  Chest tube placed.  Critical care consulted who request medicine for admission.    Appreciate consultation from [facility] who agrees to admit pt  BP (!) 149/101   Pulse 81   Temp 98.2 F (36.8 C) (Oral)   Resp 20   Ht 5' 10 (1.778 m)   Wt 83.8 kg   SpO2 94%   BMI 26.51 kg/m   Results for orders placed or performed during the hospital encounter of 08/28/23  Comprehensive metabolic panel   Collection Time: 08/28/23  5:50 AM  Result Value Ref Range   Sodium 136 135 - 145 mmol/L   Potassium 4.3 3.5 - 5.1 mmol/L   Chloride 106 98 - 111 mmol/L   CO2 21 (L) 22 - 32 mmol/L   Glucose, Bld 109 (H) 70 - 99 mg/dL   BUN 7 (L) 8 - 23 mg/dL   Creatinine, Ser 9.24 0.61 - 1.24 mg/dL   Calcium 9.5 8.9 - 89.6 mg/dL   Total Protein 7.1 6.5 - 8.1 g/dL   Albumin 3.9 3.5 - 5.0 g/dL   AST 19 15 - 41 U/L   ALT 23 0 - 44 U/L   Alkaline Phosphatase 62 38 - 126 U/L   Total Bilirubin 0.9 0.0 - 1.2 mg/dL   GFR, Estimated >39 >39 mL/min   Anion gap 9 5 - 15  CBC   Collection Time: 08/28/23  5:50 AM  Result Value Ref Range   WBC 9.1 4.0 - 10.5 K/uL   RBC 4.77 4.22 - 5.81 MIL/uL   Hemoglobin 16.1 13.0 - 17.0 g/dL   HCT 54.5 60.9 - 47.9 %   MCV 95.2 80.0 - 100.0 fL   MCH 33.8 26.0 - 34.0 pg   MCHC 35.5 30.0 - 36.0 g/dL   RDW 87.7 88.4 - 84.4 %   Platelets 371 150 - 400 K/uL   nRBC 0.0 0.0 - 0.2 %  Troponin I (High Sensitivity)   Collection Time: 08/28/23  5:50 AM  Result Value Ref Range   Troponin I (High Sensitivity) 6 <18 ng/L  Brain natriuretic peptide   Collection Time: 08/28/23  6:30 AM  Result Value Ref Range   B Natriuretic Peptide 35.5 0.0 - 100.0 pg/mL   DG Chest Portable 1 View Result Date: 08/28/2023 EXAM: 1 VIEW XRAY OF THE CHEST 08/28/2023 07:11:04 AM COMPARISON: None available. CLINICAL HISTORY: Chest tube placed for large right-sided pneumothorax with shift of cardiomediastinal anatomy to the left concerning for tension pneumothorax.  FINDINGS: LUNGS AND PLEURA: A right-sided pigtail catheter chest tube is now in place. The right lung is re-expanded. Minimal right pneumothorax remains. Basilar atelectasis is noted. The left lung is clear. HEART AND MEDIASTINUM: No acute abnormality of the cardiac and mediastinal silhouettes. BONES AND SOFT TISSUES: No acute osseous abnormality. IMPRESSION: 1. Right lung re-expanded with minimal residual right pneumothorax and basilar atelectasis. 2. Right-sided pigtail catheter in place. Electronically signed by: Lonni Necessary MD 08/28/2023 07:30 AM EDT RP Workstation: [id number]   DG Chest 1 View Result Date: 08/28/2023 CLINICAL DATA:  Chest pain. Patient says it feels like is lung is collapsed. History of pneumothorax. EXAM: CHEST  1 VIEW COMPARISON:  06/19/2008 FINDINGS: Large right-sided pneumothorax with shift of cardiomediastinal anatomy to the left. Left lung clear. Cardiopericardial silhouette is at upper limits of normal for size. No substantial pleural fluid. No acute bony abnormality. IMPRESSION: Large right-sided pneumothorax with shift of cardiomediastinal anatomy to the left concerning for tension pneumothorax.  Critical Value/emergent results were called by telephone at the time of interpretation on 08/28/2023 at 6:35 am to provider JOSHUA LONG , who verbally acknowledged these results. Electronically Signed   By: Camellia Candle M.D.   On: 08/28/2023 06:37      Nivia Colon, PA-C 08/28/23 0848    Elnor Jayson LABOR, DO 08/30/23 2059

## 2023-08-28 NOTE — Consult Note (Addendum)
 [address]       [place] [address]             [phone]        Francois Elk [facility] Medical Record #[id number] Date of Birth: 03-Jul-1960  Referring:Grace Bowser MSN, AGACNP-BC  Primary Care: Tonita Fallow, MD Primary Cardiologist:None  Reason for consult: Evaluation for potential surgical management of recurrent right spontaneous pneumothorax.    History of Present Illness:    Mr. Maleek Craver is a 63 year old gentleman with a past history of hypertension, hypothyroidism, dyslipidemia,  and who is accompanied current tobacco user.  He has smoked for the last 50 years at a rate of 1/2 to 1 pack daily.  He has a history of left spontaneous pneumothorax at age 20 treated with a chest tube only.  He had a right pneumothorax last month while [place] the beach near [facility].  He was managed admitted to the local hospital after chest tube placement and discharged back to [place] on hospital day 3 after the air leak resolved.  He presented to the [facility] emergency room earlier today after having a 1 day history of chest pain with shortness of breath.  Chest x-ray in the emergency room demonstrated a large right-sided pneumothorax with mediastinal shift consistent with tension pneumothorax.  Chest tube was placed in the emergency room showed complete reexpansion of the right lung.  A large air leak has persisted, however.  A CT scan was later obtained showing recurrence with about 50% right pneumothorax and multiple peripheral blebs bilaterally.  The chest tube has remained on suction and there is currently a small but persistent air leak. The patient is currently NPO.  We have been asked to evaluate for surgical management of his recurrent right pneumothorax. Currently, Mr. Blank is resting in bed in the emergency room.  His oxygen saturation is 100% on room air.  He has some minor discomfort at the chest tube insertion site but is  otherwise comfortable. Mr. Pfluger is married and lives here in [place].  He continues to work for a [employer].   Current Activity/ Functional Status: Patient is independent with mobility/ambulation, transfers, ADL's, IADL's.   Zubrod Score: At the time of surgery this patient's most appropriate activity status/level should be described as: []     0    Normal activity, no symptoms [x]     1    Restricted in physical strenuous activity but ambulatory, able to do out light work []     2    Ambulatory and capable of self care, unable to do work activities, up and about                 more than 50%  Of the time                            []     3    Only limited self care, in bed greater than 50% of waking hours []     4    Completely disabled, no self care, confined to bed or chair []     5    Moribund  Past Medical History:  Diagnosis Date   Cluster headache    Hyperlipidemia    Hypertension    Prediabetes    Vitamin D  deficiency     Past Surgical History:  Procedure Laterality Date  APPENDECTOMY  1972   INCISE AND DRAIN ABCESS  2008    right shoulder   PLEURAL SCARIFICATION Left 1979   chest tube thoracostomy    Social History   Tobacco Use  Smoking Status Every Day   Current packs/day: 0.75   Average packs/day: 0.8 packs/day for 50.5 years (37.9 ttl pk-yrs)   Types: Cigarettes   Start date: 1975  Smokeless Tobacco Never    Social History   Substance and Sexual Activity  Alcohol Use Yes   Alcohol/week: 0.0 standard drinks of alcohol   Comment: occ beer     Allergies  Allergen Reactions   Topamax [Topiramate] Other (See Comments)    Confusion Brain fog    Current Facility-Administered Medications  Medication Dose Route Frequency Provider Last Rate Last Admin   acetaminophen  (TYLENOL ) tablet 1,000 mg  1,000 mg Oral TID Georgina Basket, MD       NOREEN ON 08/29/2023] levothyroxine  (SYNTHROID ) tablet 88 mcg  88 mcg  Oral Daily Georgina Basket, MD       methocarbamol  (ROBAXIN ) tablet 500 mg  500 mg Oral TID Georgina Basket, MD       nicotine  (NICODERM CQ  - dosed in mg/24 hours) patch 14 mg  14 mg Transdermal Daily Dgayli, Belva, MD       nicotine  polacrilex (NICORETTE ) gum 2 mg  2 mg Oral Q2H PRN Isadora Belva, MD       oxyCODONE  (Oxy IR/ROXICODONE ) immediate release tablet 5 mg  5 mg Oral Q4H PRN Georgina Basket, MD       sodium chloride  flush (NS) 0.9 % injection 10 mL  10 mL Intrapleural Q8H Bowser, Ronnald BRAVO, NP       verapamil  (CALAN -SR) CR tablet 240 mg  240 mg Oral Daily Georgina Basket, MD       Current Outpatient Medications  Medication Sig Dispense Refill   Ascorbic Acid (VITAMIN C PO) Take 1 tablet by mouth daily.     aspirin 81 MG tablet Take 81 mg by mouth daily.      Cholecalciferol (VITAMIN D -3 PO) Take 1 capsule by mouth daily.     ibuprofen (ADVIL) 200 MG tablet Take 400 mg by mouth daily as needed for headache or moderate pain (pain score 4-6).     levothyroxine  (SYNTHROID ) 88 MCG tablet Take 1 tablet (88 mcg total) by mouth daily. 30 tablet 11   meloxicam  (MOBIC ) 15 MG tablet TAKE 1/2 TO 1 TAB DAILY WITH FOOD FOR PAIN/INFLAMMATION LIMIT TO 5 TABS/WEEK TO AVOID KIDNEY DAMAGE (Patient taking differently: Take 15 mg by mouth every other day.) 90 tablet 3   MILK THISTLE PO Take 1 tablet by mouth daily.     Multiple Vitamins-Minerals (ZINC PO) Take 1 tablet by mouth daily.     verapamil  (CALAN -SR) 240 MG CR tablet TAKE 1 TABLET DAILY FOR BP & CLUSTER HA PROPHYLAXIS 30 tablet 11    (Not in a hospital admission)   Family History  Problem Relation Age of Onset   Stroke Father      Review of Systems:     Cardiac Review of Systems: Y or  [    ]= no  Chest Pain [  now resolved  ]  Resting SOB [ now resolved  ] Exertional SOB  [ x ]  Orthopnea [  ]   Pedal Edema [   ]    Palpitations [  ] Syncope  [  ]   Presyncope [   ]  General Review  of Systems: [Y] = yes [  ]=no Constitional: recent weight  change [  ]; anorexia [  ]; fatigue [  ]; nausea [  ]; night sweats [  ]; fever [  ]; or chills [  ]                                                               Dental: Last Dentist visit:   Eye : blurred vision [  ]; diplopia [   ]; vision changes [  ];  Amaurosis fugax[  ]; Resp: cough [  ];  wheezing[  ];  hemoptysis[  ]; shortness of breath[ x ]; paroxysmal nocturnal dyspnea[  ]; dyspnea on exertion[  ]; or orthopnea[  ];  GI:  gallstones[  ], vomiting[  ];  dysphagia[  ]; melena[  ];  hematochezia [  ]; heartburn[  ];   Hx of  Colonoscopy[  ]; GU: kidney stones [  ]; hematuria[  ];   dysuria [  ];  nocturia[  ];  history of     obstruction [  ]; urinary frequency [  ]             Skin: rash, swelling[  ];, hair loss[  ];  peripheral edema[  ];  or itching[  ]; Musculosketetal: myalgias[  ];  joint swelling[  ];  joint erythema[  ];  joint pain[  ];  back pain[  ];  Heme/Lymph: bruising[  ];  bleeding[  ];  anemia[  ];  Neuro: TIA[  ];  headaches[  ];  stroke[  ];  vertigo[  ];  seizures[  ];   paresthesias[  ];  difficulty walking[  ];  Psych:depression[  ]; anxiety[  ];  Endocrine: diabetes[  ];  thyroid dysfunction[  ];             Physical Exam: BP (!) 149/101   Pulse 81   Temp 98.4 F (36.9 C) (Oral)   Resp 20   Ht 5' 10 (1.778 m)   Wt 83.8 kg   SpO2 94%   BMI 26.51 kg/m    General appearance: alert, cooperative, and no distress Head: Normocephalic, without obvious abnormality, atraumatic Neck: no adenopathy, no carotid bruit, no JVD, and supple, symmetrical, trachea midline Lymph nodes: No obvious cervical or clavicular adenopathy Resp: Normal work of breathing on room air with oxygen saturation of 100%.  Breath sounds clear and full on the left, diminished with few wheezes and a pleural rub on the right.  Small bore chest tube exits the right chest and is connected to a Pleur-evac on suction.  There is a small intermittent airleak.  There is thin clear fluid in the  tubing. Cardio: Regular rate and rhythm, no murmur.  Monitor shows normal sinus rhythm GI: Soft, no tenderness, active bowel sounds Extremities: Warm, well-perfused, no deformities Neurologic: Grossly normal  Diagnostic Studies & Laboratory data:     Recent Radiology Findings:   CT CHEST WO CONTRAST Result Date: 08/28/2023 CLINICAL DATA:  Spontaneous pneumothorax.  Chest tube placement EXAM: CT CHEST WITHOUT CONTRAST TECHNIQUE: Multidetector CT imaging of the chest was performed following the standard protocol without IV contrast. RADIATION DOSE REDUCTION: This exam was performed according to the departmental dose-optimization program which includes  automated exposure control, adjustment of the mA and/or kV according to patient size and/or use of iterative reconstruction technique. COMPARISON:  Radiograph 08/28/2023. FINDINGS: Cardiovascular: No significant vascular findings. Normal heart size. No pericardial effusion. Mediastinum/Nodes: No mediastinal shift. Trachea is midline. Small mediastinal lymph nodes not pathologic by size criteria. Lungs/Pleura: The RIGHT pneumothorax increased in volume. Pneumothorax occupies approximately 50% of the RIGHT hemithorax volume. Pigtail chest tube catheter in the anterior RIGHT pleural space. Atelectasis at the RIGHT lung base. Upper Abdomen: Limited view of the liver, kidneys, pancreas are unremarkable. Normal adrenal glands. Musculoskeletal: No aggressive osseous lesion. IMPRESSION: 1. Increase in volume large RIGHT pneumothorax. 2. RIGHT chest tube appears in appropriate position. Electronically Signed   By: [name] M.D.   On: 08/28/2023 09:25   DG Chest Portable 1 View Result Date: 08/28/2023 EXAM: 1 VIEW XRAY OF THE CHEST 08/28/2023 07:11:04 AM COMPARISON: None available. CLINICAL HISTORY: Chest tube placed for large right-sided pneumothorax with shift of cardiomediastinal anatomy to the left concerning for tension pneumothorax. FINDINGS: LUNGS AND  PLEURA: A right-sided pigtail catheter chest tube is now in place. The right lung is re-expanded. Minimal right pneumothorax remains. Basilar atelectasis is noted. The left lung is clear. HEART AND MEDIASTINUM: No acute abnormality of the cardiac and mediastinal silhouettes. BONES AND SOFT TISSUES: No acute osseous abnormality. IMPRESSION: 1. Right lung re-expanded with minimal residual right pneumothorax and basilar atelectasis. 2. Right-sided pigtail catheter in place. Electronically signed by: Lonni Necessary MD 08/28/2023 07:30 AM EDT RP Workstation: [id number]   DG Chest 1 View Result Date: 08/28/2023 CLINICAL DATA:  Chest pain. Patient says it feels like is lung is collapsed. History of pneumothorax. EXAM: CHEST  1 VIEW COMPARISON:  06/19/2008 FINDINGS: Large right-sided pneumothorax with shift of cardiomediastinal anatomy to the left. Left lung clear. Cardiopericardial silhouette is at upper limits of normal for size. No substantial pleural fluid. No acute bony abnormality. IMPRESSION: Large right-sided pneumothorax with shift of cardiomediastinal anatomy to the left concerning for tension pneumothorax. Critical Value/emergent results were called by telephone at the time of interpretation on 08/28/2023 at 6:35 am to provider JOSHUA LONG , who verbally acknowledged these results. Electronically Signed   By: Camellia Candle M.D.   On: 08/28/2023 06:37     I have independently reviewed the above radiologic studies and discussed with the patient   Recent Lab Findings: Lab Results  Component Value Date   WBC 9.1 08/28/2023   HGB 16.1 08/28/2023   HCT 45.4 08/28/2023   PLT 371 08/28/2023   GLUCOSE 109 (H) 08/28/2023   CHOL 179 03/02/2023   TRIG 144 03/02/2023   HDL 64 03/02/2023   LDLCALC 91 03/02/2023   ALT 23 08/28/2023   AST 19 08/28/2023   NA 136 08/28/2023   K 4.3 08/28/2023   CL 106 08/28/2023   CREATININE 0.75 08/28/2023   BUN 7 (L) 08/28/2023   CO2 21 (L) 08/28/2023   TSH 4.26  03/02/2023   HGBA1C 5.7 (H) 03/02/2023      Assessment / Plan:      - 63 year old male with a past history of hypertension, hypothyroidism, and prediabetes. He is a tobacco smoker x 50 years and has prior history of a left spontaneous pneumothorax 45 years ago and a right spontaneous pneumothorax 3 weeks ago.  He presented to the emergency room this morning with recurrent chest pain and shortness of breath and was discovered to have a recurrent right tension pneumothorax that has been treated with a small  bore chest tube.  There was initial full reexpansion of the right lung on CXR but on CT chest obtained later in the morning, he was found to have a 50% right pneumothorax.  The chest tube was likely off suction at the time.  The pleural tube is currently back on suction and he is maintaining oxygen saturation of 100% on room air with minimal chest discomfort.  The chest x-ray and CT scan reviewed.  He has significant bilateral bullous emphysema.  Since this is the second right spontaneous pneumothorax in a 3-week timeframe, he would likely be best served with surgical bleb resection and mechanical pleurodesis.  Dr. Kerrin will see Mr. Carleen and review imaging.  Discussion regarding timing of surgery will follow.  Mr. Davalos is currently n.p.o. since last evening.  -History of hypertension: Verapamil  slow release 240 mg daily.  -History of hypothyroidism: On levothyroxine     I  spent 20 minutes counseling the patient face to face.  Laurel JUDITHANN Becket, PA-C  08/28/2023 9:44 AM  Patient seen and examined, agree with above 60 yo smoker with recent right pneumothorax presents with Cp and SOB.  Recurrent right pneumothorax.  Pigtail placed and lung reexpanded.  Breathing improved but c/o pain in right chest from tube.  VATS for bleb resection and pleural abrasion indicated due to high risk of recurrence.  I discussed the general nature of the procedure, including the need for  general anesthesia, the incisions to be used, the use of a drainage tube postoperatively with Mr. Kempner.  We discussed the expected hospital stay, overall recovery and short and long term outcomes. I informed him of the indications, risks, benefits and alternatives.   He understands the risks include, but are not limited to death, MI, DVT/PE, bleeding, possible need for transfusion, infections, cardiac arrhythmias, prolonged air leak, as well as other organ system dysfunction including respiratory, renal, or GI complications.    Will try to do tomorrow AM second case.  Elspeth MOTE Kerrin, MD [facility] [phone]

## 2023-08-28 NOTE — H&P (View-Only) (Signed)
 [address]       [place] [address]             [phone]        Francois Elk [facility] Medical Record #[id number] Date of Birth: 03-Jul-1960  Referring:Grace Bowser MSN, AGACNP-BC  Primary Care: Tonita Fallow, MD Primary Cardiologist:None  Reason for consult: Evaluation for potential surgical management of recurrent right spontaneous pneumothorax.    History of Present Illness:    Mr. Maleek Craver is a 63 year old gentleman with a past history of hypertension, hypothyroidism, dyslipidemia,  and who is accompanied current tobacco user.  He has smoked for the last 50 years at a rate of 1/2 to 1 pack daily.  He has a history of left spontaneous pneumothorax at age 20 treated with a chest tube only.  He had a right pneumothorax last month while [place] at the beach near [facility].  He was managed admitted to the local hospital after chest tube placement and [place] on hospital day 3 after the air leak resolved.  He presented to the [facility] emergency room earlier today after having a 1 day history of chest pain with shortness of breath.  Chest x-ray in the emergency room demonstrated a large right-sided pneumothorax with mediastinal shift consistent with tension pneumothorax.  Chest tube was placed in the emergency room showed complete reexpansion of the right lung.  A large air leak has persisted, however.  A CT scan was later obtained showing recurrence with about 50% right pneumothorax and multiple peripheral blebs bilaterally.  The chest tube has remained on suction and there is currently a small but persistent air leak. The patient is currently NPO.  We have been asked to evaluate for surgical management of his recurrent right pneumothorax. Currently, Mr. Blank is resting in bed in the emergency room.  His oxygen saturation is 100% on room air.  He has some minor discomfort at the chest tube insertion site but is  otherwise comfortable. Mr. Pfluger is married and lives here in [place].  He continues to work for a [employer].   Current Activity/ Functional Status: Patient is independent with mobility/ambulation, transfers, ADL's, IADL's.   Zubrod Score: At the time of surgery this patient's most appropriate activity status/level should be described as: []     0    Normal activity, no symptoms [x]     1    Restricted in physical strenuous activity but ambulatory, able to do out light work []     2    Ambulatory and capable of self care, unable to do work activities, up and about                 more than 50%  Of the time                            []     3    Only limited self care, in bed greater than 50% of waking hours []     4    Completely disabled, no self care, confined to bed or chair []     5    Moribund  Past Medical History:  Diagnosis Date   Cluster headache    Hyperlipidemia    Hypertension    Prediabetes    Vitamin D  deficiency     Past Surgical History:  Procedure Laterality Date  APPENDECTOMY  1972   INCISE AND DRAIN ABCESS  2008    right shoulder   PLEURAL SCARIFICATION Left 1979   chest tube thoracostomy    Social History   Tobacco Use  Smoking Status Every Day   Current packs/day: 0.75   Average packs/day: 0.8 packs/day for 50.5 years (37.9 ttl pk-yrs)   Types: Cigarettes   Start date: 1975  Smokeless Tobacco Never    Social History   Substance and Sexual Activity  Alcohol Use Yes   Alcohol/week: 0.0 standard drinks of alcohol   Comment: occ beer     Allergies  Allergen Reactions   Topamax [Topiramate] Other (See Comments)    Confusion Brain fog    Current Facility-Administered Medications  Medication Dose Route Frequency Provider Last Rate Last Admin   acetaminophen  (TYLENOL ) tablet 1,000 mg  1,000 mg Oral TID Georgina Basket, MD       NOREEN ON 08/29/2023] levothyroxine  (SYNTHROID ) tablet 88 mcg  88 mcg  Oral Daily Georgina Basket, MD       methocarbamol  (ROBAXIN ) tablet 500 mg  500 mg Oral TID Georgina Basket, MD       nicotine  (NICODERM CQ  - dosed in mg/24 hours) patch 14 mg  14 mg Transdermal Daily Dgayli, Belva, MD       nicotine  polacrilex (NICORETTE ) gum 2 mg  2 mg Oral Q2H PRN Isadora Belva, MD       oxyCODONE  (Oxy IR/ROXICODONE ) immediate release tablet 5 mg  5 mg Oral Q4H PRN Georgina Basket, MD       sodium chloride  flush (NS) 0.9 % injection 10 mL  10 mL Intrapleural Q8H Bowser, Ronnald BRAVO, NP       verapamil  (CALAN -SR) CR tablet 240 mg  240 mg Oral Daily Georgina Basket, MD       Current Outpatient Medications  Medication Sig Dispense Refill   Ascorbic Acid (VITAMIN C PO) Take 1 tablet by mouth daily.     aspirin 81 MG tablet Take 81 mg by mouth daily.      Cholecalciferol (VITAMIN D -3 PO) Take 1 capsule by mouth daily.     ibuprofen (ADVIL) 200 MG tablet Take 400 mg by mouth daily as needed for headache or moderate pain (pain score 4-6).     levothyroxine  (SYNTHROID ) 88 MCG tablet Take 1 tablet (88 mcg total) by mouth daily. 30 tablet 11   meloxicam  (MOBIC ) 15 MG tablet TAKE 1/2 TO 1 TAB DAILY WITH FOOD FOR PAIN/INFLAMMATION LIMIT TO 5 TABS/WEEK TO AVOID KIDNEY DAMAGE (Patient taking differently: Take 15 mg by mouth every other day.) 90 tablet 3   MILK THISTLE PO Take 1 tablet by mouth daily.     Multiple Vitamins-Minerals (ZINC PO) Take 1 tablet by mouth daily.     verapamil  (CALAN -SR) 240 MG CR tablet TAKE 1 TABLET DAILY FOR BP & CLUSTER HA PROPHYLAXIS 30 tablet 11    (Not in a hospital admission)   Family History  Problem Relation Age of Onset   Stroke Father      Review of Systems:     Cardiac Review of Systems: Y or  [    ]= no  Chest Pain [  now resolved  ]  Resting SOB [ now resolved  ] Exertional SOB  [ x ]  Orthopnea [  ]   Pedal Edema [   ]    Palpitations [  ] Syncope  [  ]   Presyncope [   ]  General Review  of Systems: [Y] = yes [  ]=no Constitional: recent weight  change [  ]; anorexia [  ]; fatigue [  ]; nausea [  ]; night sweats [  ]; fever [  ]; or chills [  ]                                                               Dental: Last Dentist visit:   Eye : blurred vision [  ]; diplopia [   ]; vision changes [  ];  Amaurosis fugax[  ]; Resp: cough [  ];  wheezing[  ];  hemoptysis[  ]; shortness of breath[ x ]; paroxysmal nocturnal dyspnea[  ]; dyspnea on exertion[  ]; or orthopnea[  ];  GI:  gallstones[  ], vomiting[  ];  dysphagia[  ]; melena[  ];  hematochezia [  ]; heartburn[  ];   Hx of  Colonoscopy[  ]; GU: kidney stones [  ]; hematuria[  ];   dysuria [  ];  nocturia[  ];  history of     obstruction [  ]; urinary frequency [  ]             Skin: rash, swelling[  ];, hair loss[  ];  peripheral edema[  ];  or itching[  ]; Musculosketetal: myalgias[  ];  joint swelling[  ];  joint erythema[  ];  joint pain[  ];  back pain[  ];  Heme/Lymph: bruising[  ];  bleeding[  ];  anemia[  ];  Neuro: TIA[  ];  headaches[  ];  stroke[  ];  vertigo[  ];  seizures[  ];   paresthesias[  ];  difficulty walking[  ];  Psych:depression[  ]; anxiety[  ];  Endocrine: diabetes[  ];  thyroid dysfunction[  ];             Physical Exam: BP (!) 149/101   Pulse 81   Temp 98.4 F (36.9 C) (Oral)   Resp 20   Ht 5' 10 (1.778 m)   Wt 83.8 kg   SpO2 94%   BMI 26.51 kg/m    General appearance: alert, cooperative, and no distress Head: Normocephalic, without obvious abnormality, atraumatic Neck: no adenopathy, no carotid bruit, no JVD, and supple, symmetrical, trachea midline Lymph nodes: No obvious cervical or clavicular adenopathy Resp: Normal work of breathing on room air with oxygen saturation of 100%.  Breath sounds clear and full on the left, diminished with few wheezes and a pleural rub on the right.  Small bore chest tube exits the right chest and is connected to a Pleur-evac on suction.  There is a small intermittent airleak.  There is thin clear fluid in the  tubing. Cardio: Regular rate and rhythm, no murmur.  Monitor shows normal sinus rhythm GI: Soft, no tenderness, active bowel sounds Extremities: Warm, well-perfused, no deformities Neurologic: Grossly normal  Diagnostic Studies & Laboratory data:     Recent Radiology Findings:   CT CHEST WO CONTRAST Result Date: 08/28/2023 CLINICAL DATA:  Spontaneous pneumothorax.  Chest tube placement EXAM: CT CHEST WITHOUT CONTRAST TECHNIQUE: Multidetector CT imaging of the chest was performed following the standard protocol without IV contrast. RADIATION DOSE REDUCTION: This exam was performed according to the departmental dose-optimization program which includes  automated exposure control, adjustment of the mA and/or kV according to patient size and/or use of iterative reconstruction technique. COMPARISON:  Radiograph 08/28/2023. FINDINGS: Cardiovascular: No significant vascular findings. Normal heart size. No pericardial effusion. Mediastinum/Nodes: No mediastinal shift. Trachea is midline. Small mediastinal lymph nodes not pathologic by size criteria. Lungs/Pleura: The RIGHT pneumothorax increased in volume. Pneumothorax occupies approximately 50% of the RIGHT hemithorax volume. Pigtail chest tube catheter in the anterior RIGHT pleural space. Atelectasis at the RIGHT lung base. Upper Abdomen: Limited view of the liver, kidneys, pancreas are unremarkable. Normal adrenal glands. Musculoskeletal: No aggressive osseous lesion. IMPRESSION: 1. Increase in volume large RIGHT pneumothorax. 2. RIGHT chest tube appears in appropriate position. Electronically Signed   By: [name] M.D.   On: 08/28/2023 09:25   DG Chest Portable 1 View Result Date: 08/28/2023 EXAM: 1 VIEW XRAY OF THE CHEST 08/28/2023 07:11:04 AM COMPARISON: None available. CLINICAL HISTORY: Chest tube placed for large right-sided pneumothorax with shift of cardiomediastinal anatomy to the left concerning for tension pneumothorax. FINDINGS: LUNGS AND  PLEURA: A right-sided pigtail catheter chest tube is now in place. The right lung is re-expanded. Minimal right pneumothorax remains. Basilar atelectasis is noted. The left lung is clear. HEART AND MEDIASTINUM: No acute abnormality of the cardiac and mediastinal silhouettes. BONES AND SOFT TISSUES: No acute osseous abnormality. IMPRESSION: 1. Right lung re-expanded with minimal residual right pneumothorax and basilar atelectasis. 2. Right-sided pigtail catheter in place. Electronically signed by: Lonni Necessary MD 08/28/2023 07:30 AM EDT RP Workstation: [id number]   DG Chest 1 View Result Date: 08/28/2023 CLINICAL DATA:  Chest pain. Patient says it feels like is lung is collapsed. History of pneumothorax. EXAM: CHEST  1 VIEW COMPARISON:  06/19/2008 FINDINGS: Large right-sided pneumothorax with shift of cardiomediastinal anatomy to the left. Left lung clear. Cardiopericardial silhouette is at upper limits of normal for size. No substantial pleural fluid. No acute bony abnormality. IMPRESSION: Large right-sided pneumothorax with shift of cardiomediastinal anatomy to the left concerning for tension pneumothorax. Critical Value/emergent results were called by telephone at the time of interpretation on 08/28/2023 at 6:35 am to provider JOSHUA LONG , who verbally acknowledged these results. Electronically Signed   By: Camellia Candle M.D.   On: 08/28/2023 06:37     I have independently reviewed the above radiologic studies and discussed with the patient   Recent Lab Findings: Lab Results  Component Value Date   WBC 9.1 08/28/2023   HGB 16.1 08/28/2023   HCT 45.4 08/28/2023   PLT 371 08/28/2023   GLUCOSE 109 (H) 08/28/2023   CHOL 179 03/02/2023   TRIG 144 03/02/2023   HDL 64 03/02/2023   LDLCALC 91 03/02/2023   ALT 23 08/28/2023   AST 19 08/28/2023   NA 136 08/28/2023   K 4.3 08/28/2023   CL 106 08/28/2023   CREATININE 0.75 08/28/2023   BUN 7 (L) 08/28/2023   CO2 21 (L) 08/28/2023   TSH 4.26  03/02/2023   HGBA1C 5.7 (H) 03/02/2023      Assessment / Plan:      - 63 year old male with a past history of hypertension, hypothyroidism, and prediabetes. He is a tobacco smoker x 50 years and has prior history of a left spontaneous pneumothorax 45 years ago and a right spontaneous pneumothorax 3 weeks ago.  He presented to the emergency room this morning with recurrent chest pain and shortness of breath and was discovered to have a recurrent right tension pneumothorax that has been treated with a small  bore chest tube.  There was initial full reexpansion of the right lung on CXR but on CT chest obtained later in the morning, he was found to have a 50% right pneumothorax.  The chest tube was likely off suction at the time.  The pleural tube is currently back on suction and he is maintaining oxygen saturation of 100% on room air with minimal chest discomfort.  The chest x-ray and CT scan reviewed.  He has significant bilateral bullous emphysema.  Since this is the second right spontaneous pneumothorax in a 3-week timeframe, he would likely be best served with surgical bleb resection and mechanical pleurodesis.  Dr. Kerrin will see Mr. Carleen and review imaging.  Discussion regarding timing of surgery will follow.  Mr. Davalos is currently n.p.o. since last evening.  -History of hypertension: Verapamil  slow release 240 mg daily.  -History of hypothyroidism: On levothyroxine     I  spent 20 minutes counseling the patient face to face.  Laurel JUDITHANN Becket, PA-C  08/28/2023 9:44 AM  Patient seen and examined, agree with above 60 yo smoker with recent right pneumothorax presents with Cp and SOB.  Recurrent right pneumothorax.  Pigtail placed and lung reexpanded.  Breathing improved but c/o pain in right chest from tube.  VATS for bleb resection and pleural abrasion indicated due to high risk of recurrence.  I discussed the general nature of the procedure, including the need for  general anesthesia, the incisions to be used, the use of a drainage tube postoperatively with Mr. Kempner.  We discussed the expected hospital stay, overall recovery and short and long term outcomes. I informed him of the indications, risks, benefits and alternatives.   He understands the risks include, but are not limited to death, MI, DVT/PE, bleeding, possible need for transfusion, infections, cardiac arrhythmias, prolonged air leak, as well as other organ system dysfunction including respiratory, renal, or GI complications.    Will try to do tomorrow AM second case.  Elspeth MOTE Kerrin, MD [facility] [phone]

## 2023-08-28 NOTE — ED Triage Notes (Signed)
 Pt BIB [facility] from home d/t SOB - 90% on RA - hx of collapsed lung -Feels the same  BP 130/72 HR 96 RR 20 O2 90 % Ra

## 2023-08-28 NOTE — ED Notes (Signed)
 A carb modified house tray has been ordered for the patient. Per [facility], the tray should arrive by 2:30pm, if not sooner.

## 2023-08-28 NOTE — ED Notes (Signed)
 [facility] called and notified

## 2023-08-28 NOTE — ED Notes (Signed)
 C[facility] called and they said they had them on the monitor already

## 2023-08-29 ENCOUNTER — Encounter (HOSPITAL_COMMUNITY)
Admission: EM | Disposition: A | Discharge status: Home / Self Care | Payer: Self-pay | Source: Home / Self Care | Attending: Thoracic Surgery (Cardiothoracic Vascular Surgery)

## 2023-08-29 ENCOUNTER — Other Ambulatory Visit: Payer: Self-pay

## 2023-08-29 ENCOUNTER — Inpatient Hospital Stay (HOSPITAL_COMMUNITY)

## 2023-08-29 ENCOUNTER — Inpatient Hospital Stay (HOSPITAL_COMMUNITY): Admitting: Certified Registered"

## 2023-08-29 ENCOUNTER — Encounter (HOSPITAL_COMMUNITY): Payer: Self-pay | Admitting: Hospitalist

## 2023-08-29 DIAGNOSIS — I10 Essential (primary) hypertension: Secondary | ICD-10-CM | POA: Diagnosis not present

## 2023-08-29 DIAGNOSIS — E039 Hypothyroidism, unspecified: Secondary | ICD-10-CM | POA: Diagnosis not present

## 2023-08-29 DIAGNOSIS — J449 Chronic obstructive pulmonary disease, unspecified: Secondary | ICD-10-CM | POA: Diagnosis not present

## 2023-08-29 DIAGNOSIS — J930 Spontaneous tension pneumothorax: Secondary | ICD-10-CM

## 2023-08-29 DIAGNOSIS — J9311 Primary spontaneous pneumothorax: Secondary | ICD-10-CM

## 2023-08-29 DIAGNOSIS — Z9889 Other specified postprocedural states: Secondary | ICD-10-CM

## 2023-08-29 HISTORY — PX: STAPLING OF BLEBS: SHX6429

## 2023-08-29 HISTORY — PX: VIDEO ASSISTED THORACOSCOPY: SHX5073

## 2023-08-29 LAB — BASIC METABOLIC PANEL WITH GFR
Anion gap: 4 — ABNORMAL LOW (ref 5–15)
Anion gap: 11 (ref 5–15)
BUN: 11 mg/dL (ref 8–23)
BUN: 10 mg/dL (ref 8–23)
CO2: 23 mmol/L (ref 22–32)
CO2: 22 mmol/L (ref 22–32)
Calcium: 8.9 mg/dL (ref 8.9–10.3)
Calcium: 9 mg/dL (ref 8.9–10.3)
Chloride: 102 mmol/L (ref 98–111)
Chloride: 97 mmol/L — ABNORMAL LOW (ref 98–111)
Creatinine, Ser: 0.9 mg/dL (ref 0.61–1.24)
Creatinine, Ser: 1.34 mg/dL — ABNORMAL HIGH (ref 0.61–1.24)
GFR, Estimated: >60 mL/min (ref >60)
GFR, Estimated: 59 mL/min — ABNORMAL LOW (ref >60)
Glucose, Bld: 125 mg/dL — ABNORMAL HIGH (ref 70–99)
Glucose, Bld: 192 mg/dL — ABNORMAL HIGH (ref 70–99)
Potassium: 3.8 mmol/L (ref 3.5–5.1)
Potassium: 4.1 mmol/L (ref 3.5–5.1)
Sodium: 129 mmol/L — ABNORMAL LOW (ref 135–145)
Sodium: 130 mmol/L — ABNORMAL LOW (ref 135–145)

## 2023-08-29 LAB — SURGICAL PCR SCREEN
MRSA, PCR: NEGATIVE
Staphylococcus aureus: NEGATIVE

## 2023-08-29 LAB — MAGNESIUM: Magnesium: 2 mg/dL (ref 1.7–2.4)

## 2023-08-29 SURGERY — VIDEO ASSISTED THORACOSCOPY
Anesthesia: General | Site: Chest | Laterality: Right

## 2023-08-29 MED ORDER — PROPOFOL 10 MG/ML IV BOLUS
INTRAVENOUS | Status: DC | PRN
  Administered 2023-08-29: 130 mg via INTRAVENOUS

## 2023-08-29 MED ORDER — ONDANSETRON HCL 4 MG/2ML IJ SOLN: 4.0000 mg | Freq: Four times a day (QID) | INTRAMUSCULAR | Status: DC | PRN

## 2023-08-29 MED ORDER — BISACODYL 5 MG PO TBEC
10.0000 mg | DELAYED_RELEASE_TABLET | Freq: Every day | ORAL | Status: DC
  Administered 2023-08-29 – 2023-08-31 (×3): 10 mg via ORAL
  Filled 2023-08-29 (×3): qty 2

## 2023-08-29 MED ORDER — ACETAMINOPHEN 10 MG/ML IV SOLN
1000.0000 mg | Freq: Once | INTRAVENOUS | Status: DC | PRN
Start: 2023-08-29 — End: 2023-08-29
  Administered 2023-08-29: 1000 mg via INTRAVENOUS

## 2023-08-29 MED ORDER — DEXAMETHASONE SODIUM PHOSPHATE 10 MG/ML IJ SOLN
INTRAMUSCULAR | Status: DC | PRN
  Administered 2023-08-29: 10 mg via INTRAVENOUS

## 2023-08-29 MED ORDER — ACETAMINOPHEN 10 MG/ML IV SOLN
INTRAVENOUS | Status: AC
Start: 2023-08-29 — End: 2023-08-29
  Filled 2023-08-29: qty 100

## 2023-08-29 MED ORDER — CHLORHEXIDINE GLUCONATE CLOTH 2 % EX PADS
6.0000 | MEDICATED_PAD | Freq: Every day | CUTANEOUS | Status: DC
  Administered 2023-08-29 – 2023-08-31 (×3): 6 via TOPICAL

## 2023-08-29 MED ORDER — CEFAZOLIN SODIUM-DEXTROSE 2-3 GM-%(50ML) IV SOLR
INTRAVENOUS | Status: DC | PRN
  Administered 2023-08-29: 2 g via INTRAVENOUS

## 2023-08-29 MED ORDER — SODIUM CHLORIDE (PF) 0.9 % IJ SOLN
INTRAMUSCULAR | Status: AC
  Filled 2023-08-29: qty 50

## 2023-08-29 MED ORDER — CHLORHEXIDINE GLUCONATE 0.12 % MT SOLN: 15.0000 mL | Freq: Once | OROMUCOSAL | Status: DC

## 2023-08-29 MED ORDER — ACETAMINOPHEN 500 MG PO TABS
1000.0000 mg | ORAL_TABLET | Freq: Four times a day (QID) | ORAL | Status: DC
  Administered 2023-08-30 – 2023-08-31 (×6): 1000 mg via ORAL
  Filled 2023-08-29 (×7): qty 2

## 2023-08-29 MED ORDER — ONDANSETRON HCL 4 MG/2ML IJ SOLN
4.0000 mg | Freq: Once | INTRAMUSCULAR | Status: AC | PRN
  Administered 2023-08-29: 4 mg via INTRAVENOUS

## 2023-08-29 MED ORDER — SUGAMMADEX SODIUM 200 MG/2ML IV SOLN
INTRAVENOUS | Status: DC | PRN
  Administered 2023-08-29: 200 mg via INTRAVENOUS

## 2023-08-29 MED ORDER — OXYCODONE HCL 5 MG/5ML PO SOLN: 5.0000 mg | Freq: Once | ORAL | Status: DC | PRN

## 2023-08-29 MED ORDER — FENTANYL CITRATE (PF) 250 MCG/5ML IJ SOLN
INTRAMUSCULAR | Status: AC
  Filled 2023-08-29: qty 5

## 2023-08-29 MED ORDER — PHENYLEPHRINE 80 MCG/ML (10ML) SYRINGE FOR IV PUSH (FOR BLOOD PRESSURE SUPPORT)
PREFILLED_SYRINGE | INTRAVENOUS | Status: DC | PRN
  Administered 2023-08-29: 80 ug via INTRAVENOUS

## 2023-08-29 MED ORDER — BUPIVACAINE HCL (PF) 0.5 % IJ SOLN
INTRAMUSCULAR | Status: AC
  Filled 2023-08-29: qty 30

## 2023-08-29 MED ORDER — LUNG SURGERY BOOK
Freq: Once | Status: AC
  Filled 2023-08-29: qty 1

## 2023-08-29 MED ORDER — ACETAMINOPHEN 160 MG/5ML PO SOLN: 1000.0000 mg | Freq: Four times a day (QID) | ORAL | Status: DC

## 2023-08-29 MED ORDER — FENTANYL CITRATE (PF) 100 MCG/2ML IJ SOLN
25.0000 ug | INTRAMUSCULAR | Status: DC | PRN
  Administered 2023-08-29 (×3): 50 ug via INTRAVENOUS

## 2023-08-29 MED ORDER — CHLORHEXIDINE GLUCONATE 0.12 % MT SOLN
OROMUCOSAL | Status: AC
  Filled 2023-08-29: qty 15

## 2023-08-29 MED ORDER — ENOXAPARIN SODIUM 40 MG/0.4ML IJ SOSY
40.0000 mg | PREFILLED_SYRINGE | Freq: Every day | INTRAMUSCULAR | Status: DC
Start: 2023-08-30 — End: 2023-08-31
  Administered 2023-08-30 – 2023-08-31 (×2): 40 mg via SUBCUTANEOUS
  Filled 2023-08-29 (×2): qty 0.4

## 2023-08-29 MED ORDER — OXYCODONE HCL 5 MG PO TABS
5.0000 mg | ORAL_TABLET | ORAL | Status: DC | PRN
  Administered 2023-08-29: 5 mg via ORAL
  Administered 2023-08-30 – 2023-08-31 (×8): 10 mg via ORAL
  Filled 2023-08-29 (×9): qty 2

## 2023-08-29 MED ORDER — LACTATED RINGERS IV SOLN: INTRAVENOUS | Status: DC

## 2023-08-29 MED ORDER — ORAL CARE MOUTH RINSE: 15.0000 mL | Freq: Once | OROMUCOSAL | Status: AC

## 2023-08-29 MED ORDER — TRAMADOL HCL 50 MG PO TABS
50.0000 mg | ORAL_TABLET | Freq: Four times a day (QID) | ORAL | Status: DC | PRN
  Administered 2023-08-29: 50 mg via ORAL
  Administered 2023-08-30 – 2023-08-31 (×2): 100 mg via ORAL
  Filled 2023-08-29 (×2): qty 2
  Filled 2023-08-29: qty 1

## 2023-08-29 MED ORDER — CEFAZOLIN SODIUM-DEXTROSE 2-4 GM/100ML-% IV SOLN
INTRAVENOUS | Status: AC
  Filled 2023-08-29: qty 100

## 2023-08-29 MED ORDER — SODIUM CHLORIDE 0.9 % IV SOLN: INTRAVENOUS | Status: DC

## 2023-08-29 MED ORDER — FENTANYL CITRATE (PF) 100 MCG/2ML IJ SOLN
INTRAMUSCULAR | Status: AC
  Filled 2023-08-29: qty 2

## 2023-08-29 MED ORDER — ONDANSETRON HCL 4 MG/2ML IJ SOLN
4.0000 mg | Freq: Four times a day (QID) | INTRAMUSCULAR | Status: DC | PRN
Start: 2023-08-29 — End: 2023-08-31

## 2023-08-29 MED ORDER — LIDOCAINE 2% (20 MG/ML) 5 ML SYRINGE
INTRAMUSCULAR | Status: DC | PRN
  Administered 2023-08-29: 100 mg via INTRAVENOUS

## 2023-08-29 MED ORDER — BUPIVACAINE LIPOSOME 1.3 % IJ SUSP
INTRAMUSCULAR | Status: AC
  Filled 2023-08-29: qty 20

## 2023-08-29 MED ORDER — CHLORHEXIDINE GLUCONATE 0.12 % MT SOLN
15.0000 mL | Freq: Once | OROMUCOSAL | Status: AC
Start: 2023-08-29 — End: 2023-08-29
  Administered 2023-08-29: 15 mL via OROMUCOSAL

## 2023-08-29 MED ORDER — MIDAZOLAM HCL 2 MG/2ML IJ SOLN
INTRAMUSCULAR | Status: AC
  Filled 2023-08-29: qty 2

## 2023-08-29 MED ORDER — PHENYLEPHRINE HCL-NACL 20-0.9 MG/250ML-% IV SOLN
INTRAVENOUS | Status: DC | PRN
  Administered 2023-08-29: 25 ug/min via INTRAVENOUS

## 2023-08-29 MED ORDER — OXYCODONE HCL 5 MG PO TABS: 5.0000 mg | ORAL_TABLET | Freq: Once | ORAL | Status: DC | PRN

## 2023-08-29 MED ORDER — PANTOPRAZOLE SODIUM 40 MG PO TBEC
40.0000 mg | DELAYED_RELEASE_TABLET | Freq: Every day | ORAL | Status: DC
  Administered 2023-08-30 – 2023-08-31 (×2): 40 mg via ORAL
  Filled 2023-08-29 (×2): qty 1

## 2023-08-29 MED ORDER — ORAL CARE MOUTH RINSE: 15.0000 mL | Freq: Once | OROMUCOSAL | Status: DC

## 2023-08-29 MED ORDER — ROCURONIUM BROMIDE 10 MG/ML (PF) SYRINGE
PREFILLED_SYRINGE | INTRAVENOUS | Status: DC | PRN
  Administered 2023-08-29: 80 mg via INTRAVENOUS

## 2023-08-29 MED ORDER — MORPHINE SULFATE (PF) 2 MG/ML IV SOLN
2.0000 mg | INTRAVENOUS | Status: DC | PRN
  Administered 2023-08-29 – 2023-08-31 (×2): 2 mg via INTRAVENOUS
  Filled 2023-08-29 (×2): qty 1

## 2023-08-29 MED ORDER — 0.9 % SODIUM CHLORIDE (POUR BTL) OPTIME
TOPICAL | Status: DC | PRN
  Administered 2023-08-29: 1000 mL

## 2023-08-29 MED ORDER — HYDROMORPHONE HCL 1 MG/ML IJ SOLN
0.5000 mg | INTRAMUSCULAR | Status: DC | PRN
  Administered 2023-08-29 (×2): 0.5 mg via INTRAVENOUS

## 2023-08-29 MED ORDER — KETOROLAC TROMETHAMINE 15 MG/ML IJ SOLN
15.0000 mg | Freq: Four times a day (QID) | INTRAMUSCULAR | Status: DC
  Administered 2023-08-29 – 2023-08-30 (×3): 15 mg via INTRAVENOUS
  Filled 2023-08-29 (×3): qty 1

## 2023-08-29 MED ORDER — MORPHINE SULFATE (PF) 2 MG/ML IV SOLN: 2.0000 mg | INTRAVENOUS | Status: DC | PRN

## 2023-08-29 MED ORDER — SENNOSIDES-DOCUSATE SODIUM 8.6-50 MG PO TABS: 1.0000 | ORAL_TABLET | Freq: Every day | ORAL | Status: DC

## 2023-08-29 MED ORDER — CEFAZOLIN SODIUM-DEXTROSE 2-4 GM/100ML-% IV SOLN
2.0000 g | Freq: Three times a day (TID) | INTRAVENOUS | Status: AC
  Administered 2023-08-29 – 2023-08-30 (×2): 2 g via INTRAVENOUS
  Filled 2023-08-29 (×2): qty 100

## 2023-08-29 MED ORDER — HYDROMORPHONE HCL 1 MG/ML IJ SOLN
INTRAMUSCULAR | Status: AC
  Filled 2023-08-29: qty 1

## 2023-08-29 MED ORDER — FENTANYL CITRATE (PF) 250 MCG/5ML IJ SOLN
INTRAMUSCULAR | Status: DC | PRN
  Administered 2023-08-29: 150 ug via INTRAVENOUS
  Administered 2023-08-29 (×2): 50 ug via INTRAVENOUS

## 2023-08-29 SURGICAL SUPPLY — 74 items
APPLICATOR COTTON TIP 6 STRL (MISCELLANEOUS) IMPLANT
BLADE CLIPPER SURG (BLADE) ×2 IMPLANT
CANISTER SUCTION 3000ML PPV (SUCTIONS) ×2 IMPLANT
CATH THORACIC 28FR RT ANG (CATHETERS) IMPLANT
CATH THORACIC 36FR (CATHETERS) IMPLANT
CATH THORACIC 36FR RT ANG (CATHETERS) IMPLANT
CLEANER TIP ELECTROSURG 2X2 (MISCELLANEOUS) IMPLANT
CLIP APPLIE ROT 10 11.4 M/L (STAPLE) IMPLANT
CLIP TI MEDIUM 6 (CLIP) IMPLANT
CNTNR URN SCR LID CUP LEK RST (MISCELLANEOUS) ×4 IMPLANT
CONN Y 3/8X3/8X3/8 BEN (MISCELLANEOUS) ×2 IMPLANT
COVER SURGICAL LIGHT HANDLE (MISCELLANEOUS) ×2 IMPLANT
DERMABOND ADVANCED .7 DNX12 (GAUZE/BANDAGES/DRESSINGS) IMPLANT
DRAIN CHANNEL 28F RND 3/8 FF (WOUND CARE) IMPLANT
DRAIN CONNECTOR BLAKE 1:1 (MISCELLANEOUS) IMPLANT
DRAPE CV SPLIT W-CLR ANES SCRN (DRAPES) ×2 IMPLANT
DRAPE SURG ORHT 6 SPLT 77X108 (DRAPES) ×2 IMPLANT
DRAPE WARM FLUID 44X44 (DRAPES) ×2 IMPLANT
ELECT BLADE 6.5 EXT (BLADE) ×2 IMPLANT
ELECTRODE REM PT RTRN 9FT ADLT (ELECTROSURGICAL) ×2 IMPLANT
GAUZE 4X4 16PLY ~~LOC~~+RFID DBL (SPONGE) ×2 IMPLANT
GAUZE SPONGE 4X4 12PLY STRL (GAUZE/BANDAGES/DRESSINGS) ×2 IMPLANT
GLOVE SS BIOGEL STRL SZ 7.5 (GLOVE) ×2 IMPLANT
GLOVE SURG MICRO LTX SZ7.5 (GLOVE) ×2 IMPLANT
GLOVE SURG SIGNA 7.5 PF LTX (GLOVE) ×4 IMPLANT
GOWN STRL REUS W/ TWL LRG LVL3 (GOWN DISPOSABLE) ×4 IMPLANT
GOWN STRL REUS W/ TWL XL LVL3 (GOWN DISPOSABLE) ×2 IMPLANT
HEMOSTAT SURGICEL 2X14 (HEMOSTASIS) IMPLANT
IV CATH 22GX1 FEP (IV SOLUTION) IMPLANT
KIT BASIN OR (CUSTOM PROCEDURE TRAY) ×2 IMPLANT
KIT SUCTION CATH 14FR (SUCTIONS) ×2 IMPLANT
KIT TURNOVER KIT B (KITS) ×2 IMPLANT
NDL HYPO 25GX1X1/2 BEV (NEEDLE) ×2 IMPLANT
NEEDLE HYPO 25GX1X1/2 BEV (NEEDLE) IMPLANT
NS IRRIG 1000ML POUR BTL (IV SOLUTION) ×4 IMPLANT
PACK CHEST (CUSTOM PROCEDURE TRAY) ×2 IMPLANT
PAD ARMBOARD POSITIONER FOAM (MISCELLANEOUS) ×4 IMPLANT
PENCIL BUTTON HOLSTER BLD 10FT (ELECTRODE) IMPLANT
POUCH ENDO CATCH II 15MM (MISCELLANEOUS) IMPLANT
RELOAD STAPLE 45 PURP MED/THCK (STAPLE) IMPLANT
RELOAD TRI 45 ART MED THCK PUR (STAPLE) ×6 IMPLANT
RELOAD TRI 60 ART MED THCK BLK (STAPLE) IMPLANT
RELOAD TRI 60 ART MED THCK PUR (STAPLE) IMPLANT
SEALANT PROGEL (MISCELLANEOUS) IMPLANT
SEALANT SURG COSEAL 4ML (VASCULAR PRODUCTS) IMPLANT
SEALANT SURG COSEAL 8ML (VASCULAR PRODUCTS) IMPLANT
SOL ANTI FOG 6CC (MISCELLANEOUS) ×2 IMPLANT
SPECIMEN JAR MEDIUM (MISCELLANEOUS) ×2 IMPLANT
SPONGE INTESTINAL PEANUT (DISPOSABLE) IMPLANT
SPONGE T-LAP 18X18 ~~LOC~~+RFID (SPONGE) ×8 IMPLANT
SPONGE T-LAP 4X18 ~~LOC~~+RFID (SPONGE) ×2 IMPLANT
SPONGE TONSIL 1 RF SGL (DISPOSABLE) ×2 IMPLANT
STAPLER ENDO GIA 12 SHRT THIN (STAPLE) IMPLANT
STAPLER ENDO GIA 12MM SHORT (STAPLE) ×2 IMPLANT
STOPCOCK 4 WAY LG BORE MALE ST (IV SETS) ×2 IMPLANT
SUT PROLENE 4-0 RB1 .5 CRCL 36 (SUTURE) IMPLANT
SUT SILK 1 MH (SUTURE) IMPLANT
SUT VIC AB 1 CTX36XBRD ANBCTR (SUTURE) IMPLANT
SUT VIC AB 2-0 CTX 36 (SUTURE) IMPLANT
SUT VIC AB 3-0 X1 27 (SUTURE) IMPLANT
SYR 10ML LL (SYRINGE) ×2 IMPLANT
SYR 20ML LL LF (SYRINGE) IMPLANT
SYR 50ML LL SCALE MARK (SYRINGE) ×2 IMPLANT
SYSTEM BAG RETRIEVAL 10MM (BASKET) IMPLANT
SYSTEM SAHARA CHEST DRAIN ATS (WOUND CARE) ×2 IMPLANT
TAPE CLOTH 4X10 WHT NS (GAUZE/BANDAGES/DRESSINGS) ×2 IMPLANT
TAPE CLOTH SURG 4X10 WHT LF (GAUZE/BANDAGES/DRESSINGS) IMPLANT
TOWEL GREEN STERILE (TOWEL DISPOSABLE) ×2 IMPLANT
TOWEL GREEN STERILE FF (TOWEL DISPOSABLE) ×2 IMPLANT
TRAY FOLEY MTR SLVR 16FR STAT (SET/KITS/TRAYS/PACK) ×2 IMPLANT
TROCAR XCEL BLADELESS 5X75MML (TROCAR) ×2 IMPLANT
TROCAR XCEL NON-BLD 5MMX100MML (ENDOMECHANICALS) IMPLANT
TUBING EXTENTION W/L.L. (IV SETS) ×2 IMPLANT
WATER STERILE IRR 1000ML POUR (IV SOLUTION) ×4 IMPLANT

## 2023-08-29 NOTE — Hospital Course (Signed)
 History of Present Illness:    Mr. Keith Castro is a 63 year old gentleman with a past history of hypertension, hypothyroidism, dyslipidemia,  and who is accompanied current tobacco user.  He has smoked for the last 50 years at a rate of 1/2 to 1 pack daily.  He has a history of left spontaneous pneumothorax at age 4 treated with a chest tube only.  He had a right pneumothorax last month while vacationing at the beach near [facility].  He was managed admitted to the local hospital after chest tube placement and [place] on hospital day 3 after the air leak resolved.  He presented to the [facility] emergency room earlier today after having a 1 day history of chest pain with shortness of breath.  Chest x-ray in the emergency room demonstrated a large right-sided pneumothorax with mediastinal shift consistent with tension pneumothorax.  Chest tube was placed in the emergency room showed complete reexpansion of the right lung.  A large air leak has persisted, however.  A CT scan was later obtained showing recurrence with about 50% right pneumothorax and multiple peripheral blebs bilaterally.  The chest tube has remained on suction and there is currently a small but persistent air leak. The patient is currently NPO.  We have been asked to evaluate for surgical management of his recurrent right pneumothorax. Currently, Mr. Keith Castro is resting in bed in the emergency room.  His oxygen saturation is 100% on room air.  He has some minor discomfort at the chest tube insertion site but is otherwise comfortable. Mr. Keith Castro is married and lives here in [place].  He continues to work for a [employer]. Dr. Kerrin recommended VATS for bleb resection with pleural abrasion due to high risk for recurrence.   Hospital Course:Mr. Keith Castro remained stable with the pigtail catheter in place. He was taken to the OR on 7/12 25 where right  video-assisted thoracotomy was performed with wedge bleb resections of the right upper and lower lobes. Apical pleurectomy and pleural abrasion were also carried out.  Following the procedure, Mr. Keith Castro was extubated, recovered in the PACU, and later transferred to [facility].

## 2023-08-29 NOTE — Anesthesia Procedure Notes (Signed)
 Procedure Name: Intubation Date/Time: 08/29/2023 11:00 AM  Performed by: Evette Ade, CRNAPre-anesthesia Checklist: Patient identified, Emergency Drugs available, Suction available, Patient being monitored and Timeout performed Patient Re-evaluated:Patient Re-evaluated prior to induction Oxygen Delivery Method: Circle system utilized Preoxygenation: Pre-oxygenation with 100% oxygen Induction Type: IV induction Ventilation: Mask ventilation without difficulty and Oral airway inserted - appropriate to patient size Laryngoscope Size: Mac and 4 Grade View: Grade II Tube type: Oral Endobronchial tube: Left, Double lumen EBT and EBT position confirmed by fiberoptic bronchoscope and 37 Fr Airway Equipment and Method: Stylet Tube secured with: Tape Dental Injury: Teeth and Oropharynx as per pre-operative assessment

## 2023-08-29 NOTE — Plan of Care (Signed)
  Problem: Clinical Measurements: Goal: Will remain free from infection Outcome: Progressing   Problem: Nutrition: Goal: Adequate nutrition will be maintained Outcome: Progressing   Problem: Skin Integrity: Goal: Risk for impaired skin integrity will decrease Outcome: Progressing   Problem: Clinical Measurements: Goal: Ability to maintain clinical measurements within normal limits will improve Outcome: Progressing Goal: Will remain free from infection Outcome: Progressing Goal: Diagnostic test results will improve Outcome: Progressing Goal: Respiratory complications will improve Outcome: Progressing Goal: Cardiovascular complication will be avoided Outcome: Progressing   Problem: Activity: Goal: Risk for activity intolerance will decrease Outcome: Progressing   Problem: Pain Managment: Goal: General experience of comfort will improve and/or be controlled Outcome: Progressing

## 2023-08-29 NOTE — Anesthesia Preprocedure Evaluation (Signed)
 Anesthesia Evaluation  Patient identified by MRN, date of birth, ID band Patient awake    Reviewed: Allergy & Precautions, NPO status , Patient's Chart, lab work & pertinent test results, reviewed documented beta blocker date and time   History of Anesthesia Complications Negative for: history of anesthetic complications  Airway Mallampati: II  TM Distance: >3 FB     Dental no notable dental hx.    Pulmonary COPD, Current Smoker and Patient abstained from smoking. IMPRESSION: 1. Increase in volume large RIGHT pneumothorax. 2. RIGHT chest tube appears in appropriate position.     + decreased breath sounds      Cardiovascular hypertension, (-) angina (-) CAD, (-) Past MI, (-) Cardiac Stents, (-) CABG and (-) Peripheral Vascular Disease  Rhythm:Regular Rate:Normal     Neuro/Psych  Headaches    GI/Hepatic ,neg GERD  ,,(+) neg Cirrhosis        Endo/Other  Hypothyroidism    Renal/GU Renal disease     Musculoskeletal   Abdominal   Peds  Hematology   Anesthesia Other Findings   Reproductive/Obstetrics                              Anesthesia Physical Anesthesia Plan  ASA: 3  Anesthesia Plan: General   Post-op Pain Management:    Induction: Intravenous  PONV Risk Score and Plan: 2 and Ondansetron  and Dexamethasone   Airway Management Planned: Double Lumen EBT  Additional Equipment:   Intra-op Plan:   Post-operative Plan: Extubation in OR  Informed Consent: I have reviewed the patients History and Physical, chart, labs and discussed the procedure including the risks, benefits and alternatives for the proposed anesthesia with the patient or authorized representative who has indicated his/her understanding and acceptance.     Dental advisory given  Plan Discussed with: CRNA  Anesthesia Plan Comments:          Anesthesia Quick Evaluation

## 2023-08-29 NOTE — Discharge Summary (Addendum)
 Physician Discharge Summary  Patient ID: Keith Castro MRN: [id number] DOB/AGE: [birth year] 63 y.o.  Admit date: 08/28/2023 Discharge date: 08/31/2023  Admission Diagnoses: Recurrent right spontaneous pneumothorax Right tension pneumothorax Hypertension Hypothyroidism Tobacco use  Discharge Diagnoses:  Recurrent right spontaneous pneumothorax Right tension pneumothorax S/P right video assisted thoracoscopy, right upper and lower lobe wedge resection and pleural abrasion Hypertension Hypothyroidism Tobacco use  Discharged Condition: good  History of Present Illness:    Mr. Keith Castro is a 63 year old gentleman with a past history of hypertension, hypothyroidism, dyslipidemia,  and who is accompanied current tobacco user.  He has smoked for the last 50 years at a rate of 1/2 to 1 pack daily.  He has a history of left spontaneous pneumothorax at age 47 treated with a chest tube only.  He had a right pneumothorax last month while [place] the beach near [facility].  He was managed admitted to the local hospital after chest tube placement and discharged back to his [place] on hospital day 3 after the air leak resolved.  He presented to the [facility] emergency room earlier today after having a 1 day history of chest pain with shortness of breath.  Chest x-ray in the emergency room demonstrated a large right-sided pneumothorax with mediastinal shift consistent with tension pneumothorax.  Chest tube was placed in the emergency room showed complete reexpansion of the right lung.  A large air leak has persisted, however.  A CT scan was later obtained showing recurrence with about 50% right pneumothorax and multiple peripheral blebs bilaterally.  The chest tube has remained on suction and there is currently a small but persistent air leak. The patient is currently NPO.  We have been asked to evaluate for surgical management of his recurrent right pneumothorax. Currently, Mr.  [name] is resting in bed in the emergency room.  His oxygen saturation is 100% on room air.  He has some minor discomfort at the chest tube insertion site but is otherwise comfortable. Mr. [name] is married and lives here in [place].  He continues to work for a [employer]. Dr. Kerrin recommended VATS for bleb resection with pleural abrasion due to high risk for recurrence.   Hospital Course:Mr. [name] remained stable with the pigtail catheter in place. He was taken to the OR on 7/12 25 where right video-assisted thoracotomy was performed with wedge bleb resections of the right upper and lower lobes. Apical pleurectomy and pleural abrasion were also carried out.  Following the procedure, Mr. [name] was extubated, recovered in the PACU, and later transferred to [facility].  The chest tube was left on suction.  By the morning of postop day 1, the airleak had resolved.  Chest x-ray on postop day 1 showed full expansion of both lungs without pneumothorax.  The chest tube was placed to waterseal.  On postop day #2 x-ray remained stable with no evidence of pneumothorax.  The patient had no airleak in the chest tubes system.  Chest tube was removed and chest x-ray following removal showed stable appearance of chest xray without pneumothorax.  He is felt to be stable for discharge home today.  Consults: None  Significant Diagnostic Studies: radiology:   CT scan:    FINDINGS: Cardiovascular: No significant vascular findings. Normal heart size. No pericardial effusion.   Mediastinum/Nodes: No mediastinal shift. Trachea is midline. Small mediastinal lymph nodes not pathologic by size criteria.   Lungs/Pleura: The RIGHT pneumothorax increased in volume. Pneumothorax occupies  approximately 50% of the RIGHT hemithorax volume. Pigtail chest tube catheter in the anterior RIGHT pleural space. Atelectasis at the RIGHT lung base.    Upper Abdomen: Limited view of the liver, kidneys, pancreas are unremarkable. Normal adrenal glands.   Musculoskeletal: No aggressive osseous lesion.   IMPRESSION: 1. Increase in volume large RIGHT pneumothorax. 2. RIGHT chest tube appears in appropriate position.     Electronically Signed   By: [name] M.D.   On: 08/28/2023 09:25  Treatments: surgery:  NAME: Castro, Keith MEDICAL RECORD NO: [id number] ACCOUNT NO: [id number] DATE OF BIRTH: 04/05/1960 FACILITY: [facility] LOCATION: [facility] PHYSICIAN: Keith BROCKS. Kerrin, MD   Operative Report    DATE OF PROCEDURE: 08/29/2023   PREOPERATIVE DIAGNOSIS:  Recurrent right spontaneous pneumothorax.   POSTOPERATIVE DIAGNOSIS:  Recurrent right spontaneous pneumothorax.   PROCEDURE:  Right video-assisted thoracoscopy, bleb resection, pleural stripping, and abrasion.   SURGEON:  Keith BROCKS. Kerrin, MD   ASSISTANT:  Keith Becket, PA   Experienced assistance was necessary for this case due to surgical complexity.  Keith Castro assisted with camera management, exposure, retraction of delicate tissues, suctioning, and wound closure.   Discharge Exam:  Per Lemond Cera PA-C Blood pressure 129/81, pulse 70, temperature 98.4 F (36.9 C), temperature source Oral, resp. rate 16, height 5' 10 (1.778 m), weight 82.4 kg, SpO2 96%.  General appearance: alert, cooperative, and no distress Heart: regular rate and rhythm Lungs: clear to auscultation bilaterally Abdomen: benign Extremities: no edema or calf tenderness Wound: incis healing well  Discharge disposition: 01-Home or Self Care   Discharge Instructions     Ambulatory Referral for Lung Cancer Scre   Complete by: As directed       Allergies as of 08/31/2023       Reactions   Topamax [topiramate] Other (See Comments)   Confusion Brain fog        Medication List     TAKE these medications    acetaminophen  325 MG tablet Commonly known as:  Tylenol  Take 2 tablets (650 mg total) by mouth every 6 (six) hours as needed.   aspirin 81 MG tablet Take 81 mg by mouth daily.   gabapentin  300 MG capsule Commonly known as: NEURONTIN  Take 1 capsule (300 mg total) by mouth 3 (three) times daily.   ibuprofen 200 MG tablet Commonly known as: ADVIL Take 400 mg by mouth daily as needed for headache or moderate pain (pain score 4-6).   levothyroxine  88 MCG tablet Commonly known as: Synthroid  Take 1 tablet (88 mcg total) by mouth daily.   meloxicam  15 MG tablet Commonly known as: MOBIC  TAKE 1/2 TO 1 TAB DAILY WITH FOOD FOR PAIN/INFLAMMATION LIMIT TO 5 TABS/WEEK TO AVOID KIDNEY DAMAGE What changed: See the new instructions.   methocarbamol  500 MG tablet Commonly known as: ROBAXIN  Take 1 tablet (500 mg total) by mouth every 8 (eight) hours as needed for muscle spasms.   MILK THISTLE PO Take 1 tablet by mouth daily.   nicotine  14 mg/24hr patch Commonly known as: NICODERM CQ  - dosed in mg/24 hours Place 1 patch (14 mg total) onto the skin daily.   nicotine  polacrilex 2 MG gum Commonly known as: NICORETTE  Take 1 each (2 mg total) by mouth every 2 (two) hours as needed for smoking cessation.   oxyCODONE  5 MG immediate release tablet Commonly known as: Oxy IR/ROXICODONE  Take 1 tablet (5 mg total) by mouth every 6 (six) hours as needed for up to 7 days for moderate pain (pain  score 4-6).   pantoprazole  40 MG tablet Commonly known as: PROTONIX  Take 1 tablet (40 mg total) by mouth daily for 14 days.   polyethylene glycol 17 g packet Commonly known as: MIRALAX  / GLYCOLAX  Take 17 g by mouth daily.   verapamil  240 MG CR tablet Commonly known as: CALAN -SR TAKE 1 TABLET DAILY FOR BP & CLUSTER HA PROPHYLAXIS   VITAMIN C PO Take 1 tablet by mouth daily.   VITAMIN D -3 PO Take 1 capsule by mouth daily.   ZINC PO Take 1 tablet by mouth daily.         Signed:  Care by Lemond Cera PA-C   CXR reviewed and discharge order  placed by:  Rocky Shad, PA-C 08/31/2023, 12:28 PM

## 2023-08-29 NOTE — Interval H&P Note (Signed)
 History and Physical Interval Note:  08/29/2023 9:44 AM  Keith Castro  has presented today for surgery, with the diagnosis of RIGHT PNEUMOTHORAX.  The various methods of treatment have been discussed with the patient and family. After consideration of risks, benefits and other options for treatment, the patient has consented to  Procedure(s) with comments: VIDEO ASSISTED THORACOSCOPY (Right) STAPLING, BLEB, LUNG (Right) - with pleural abrasion as a surgical intervention.  The patient's history has been reviewed, patient examined, no change in status, stable for surgery.  I have reviewed the patient's chart and labs.  Questions were answered to the patient's satisfaction.     [name]

## 2023-08-29 NOTE — Discharge Instructions (Signed)
 Discharge Instructions:  1. You may shower, please wash incisions daily with soap and water and keep dry.  If you wish to cover wounds with dressing you may do so but please keep clean and change daily.  No tub baths or swimming until incisions have completely healed.  If your incisions become red or develop any drainage please call our office at [phone]  2. No Driving until cleared by Dr. Chrystal office and you are no longer using narcotic pain medications  3. Monitor your weight daily.. Please use the same scale and weigh at same time... If you gain 5-10 lbs in 48 hours with associated lower extremity swelling, please contact our office at [phone]  4. Fever of 101.5 for at least 24 hours with no source, please contact our office at [phone]  5. Activity- up as tolerated, please walk at least 3 times per day.  Avoid strenuous activity, no lifting, pushing, or pulling with your arms over 8-10 lbs for a minimum of 6 weeks  6. If any questions or concerns arise, please do not hesitate to contact our office at [phone]

## 2023-08-29 NOTE — Op Note (Signed)
 NAME: Keith Castro, GOHEEN MEDICAL RECORD NO: [id number] ACCOUNT NO: [id number] DATE OF BIRTH: Oct 08, 1960 FACILITY: [facility] LOCATION: [facility] PHYSICIAN: Elspeth BROCKS. Kerrin, MD  Operative Report   DATE OF PROCEDURE: 08/29/2023  PREOPERATIVE DIAGNOSIS:  Recurrent right spontaneous pneumothorax.  POSTOPERATIVE DIAGNOSIS:  Recurrent right spontaneous pneumothorax.  PROCEDURE:  Right video-assisted thoracoscopy, bleb resection, pleural stripping, and abrasion.  SURGEON:  Elspeth BROCKS. Kerrin, MD  ASSISTANT:  Laurel Becket, PA  Experienced assistance was necessary for this case due to surgical complexity.  Laurel Becket assisted with camera management, exposure, retraction of delicate tissues, suctioning, and wound closure.  ANESTHESIA:  General.  FINDINGS:  Complex apical blebs with ruptured bleb apparent with adhesions to apical chest wall.  Anterior right upper lobe blebs as well.  Adhesions and possible bleb of lower lobe.  CLINICAL NOTE:  Mr. Keith Castro is a 63 year old gentleman with a history of tobacco abuse who had a spontaneous pneumothorax approximately a month ago.  He presented with chest pain and shortness of breath and had a recurrent right spontaneous pneumothorax.  He was advised to undergo surgical resection because of the high risk of recurrence.  The indications, risks, benefits, and alternatives were discussed in detail with the patient.  He understood and accepted the risks and agreed to proceed.  OPERATIVE NOTE:  Mr. Keith Castro was brought to the operating room on 08/29/2023.  He had induction of general anesthesia and was intubated with a double-lumen endotracheal tube.  Intravenous antibiotics were administered.  A Foley catheter was placed.  Sequential compression devices were placed on the calves for DVT prophylaxis.  He was placed in a left lateral decubitus position.  A Bair Hugger was placed for active warming.  Single-lung ventilation was initiated.  The  existing chest tube was removed.  The right chest was prepped and draped in the usual sterile fashion.  A timeout was performed.  An incision was made in the eighth interspace in the anterior axillary line.  A 5 mm port was inserted and the thoracoscope was advanced into the chest.  There was good isolation of the right lung.  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  There were blebs at the apex with a ruptured bleb apparent.  There were adhesions in that area.  A working incision was made in the fourth interspace anterolaterally.  No rib spreading was performed during the procedure.  The adhesions at the apex were taken down and the complex apical blebs were resected with sequential firings of the Covidien stapler.  All staple lines were reinforced with a combination of purple and black cartridges used.   Inspection of the upper lobe revealed another bleb more anteriorly.  A counterincision was made posteriorly in the eighth interspace and the stapler was advanced through it to allow stapling of the anterior bleb.  Again, reinforced staple lines were used.  Inspection of the lower lobe revealed no blebs in the superior segment.  There were adhesions at the base to the diaphragm.  These were taken down with cautery and this area was stapled, again using reinforced staple lines.  The pleura then was stripped at the apex down to the level of the third rib.  The remainder of the parietal pleural surface was lightly abraded using a Bovie scratch pad.  A 28-French chest tube was placed through the original port incision and directed to the apex.  It was secured with a #1 silk suture.  Dual-lung ventilation was resumed.  The incisions were closed in  standard fashion.  The chest tube was placed to a Pleur-evac on suction.  The patient then was transported from the operating room to the post-anesthetic care unit, extubated and in fair condition.   SHW D: 08/29/2023 1:15:26 pm T:  08/29/2023 10:49:00 pm  JOB: [id number]/ [id number]

## 2023-08-29 NOTE — Progress Notes (Addendum)
 [facility] PROGRESS NOTE  Patient: Beryl Boone FMW:[id number]   PCP: Tonita Fallow, MD DOB: 1960-11-01   DOA: 08/28/2023   DOS: 08/29/2023    Subjective: Patient seen after the surgery.  Pain well-controlled.  No nausea no vomiting.  Somewhat jokingly upset that he was moved out of his prior room.  Objective:  Vitals:   08/29/23 1430 08/29/23 1445 08/29/23 1500 08/29/23 1508  BP: 112/77 118/83 124/82 129/87  Pulse: 68 69 68 71  Resp: 11 16 13 16   Temp:   97.6 F (36.4 C) 97.6 F (36.4 C)  TempSrc:    Oral  SpO2: 99% 96% 96% 97%  Weight:      Height:       S1-S2 present. Clear to auscultation. Bowel sound present No edema Alert awake and oriented. Assessment and plan: Recurrent spontaneous pneumothorax. Status post video-assisted thoracoscopy with right lung stapling and blab. Has a chest tube. Currently under CT surgery service. Will continue to follow for medical management.  Hypothyroidism. Continuing Synthroid .  HTN. Verapamil . Currently on hold.  Will monitor.  May need to resume it at a lower dose.  Hyponatremia. Sodium 129. Will repeat to verify.  Author: Yetta Blanch, MD [facility] 08/29/2023 6:49 PM   If 7PM-7AM, please contact night-coverage at [email or web address]

## 2023-08-29 NOTE — Anesthesia Postprocedure Evaluation (Signed)
 Anesthesia Post Note  Patient: Keith Castro  Procedure(s) Performed: VIDEO ASSISTED THORACOSCOPY (Right: Chest) STAPLING, BLEB, LUNG (Right)     Patient location during evaluation: PACU Anesthesia Type: General Level of consciousness: awake and alert Pain management: pain level controlled Vital Signs Assessment: post-procedure vital signs reviewed and stable Respiratory status: spontaneous breathing, nonlabored ventilation, respiratory function stable and patient connected to nasal cannula oxygen Cardiovascular status: blood pressure returned to baseline and stable Postop Assessment: no apparent nausea or vomiting Anesthetic complications: no   No notable events documented.  Last Vitals:  Vitals:   08/29/23 1500 08/29/23 1508  BP: 124/82 129/87  Pulse: 68 71  Resp: 13 16  Temp: 36.4 C 36.4 C  SpO2: 96% 97%    Last Pain:  Vitals:   08/29/23 1508  TempSrc: Oral  PainSc: 7                  Lynwood MARLA Cornea

## 2023-08-29 NOTE — Brief Op Note (Signed)
 08/28/2023 - 08/29/2023  12:46 PM  PATIENT:  Keith Castro  63 y.o. male  PRE-OPERATIVE DIAGNOSIS:  RECURRENT RIGHT SPONTANEOUS PNEUMOTHORAX  POST-OPERATIVE DIAGNOSIS:  RECURRENT RIGHT SPONTANEOUS PNEUMOTHORAX  PROCEDURE:  VIDEO ASSISTED THORACOSCOPY (Right) STAPLING, BLEB, LUNG (Right) - with pleural abrasion  SURGEON:  Kerrin Elspeth BROCKS, MD   PHYSICIAN ASSISTANT: Loann Chahal  ASSISTANTS: Dyann Iha, RN, Scrub Person    ANESTHESIA:   general  EBL:  10 mL   BLOOD ADMINISTERED:none  DRAINS: 66fr Blake drain to right pleural space   LOCAL MEDICATIONS USED:  NONE  SPECIMEN:  right upper and lower lobe lung wedges, apical parietal pleura  DISPOSITION OF SPECIMEN:  PATHOLOGY  COUNTS:  Correct  DICTATION: .Dragon Dictation  PLAN OF CARE: Admit to inpatient   PATIENT DISPOSITION:  PACU - guarded condition.   Delay start of Pharmacological VTE agent (>24hrs) due to surgical blood loss or risk of bleeding: No

## 2023-08-29 NOTE — Transfer of Care (Signed)
 Immediate Anesthesia Transfer of Care Note  Patient: Keith Castro  Procedure(s) Performed: VIDEO ASSISTED THORACOSCOPY (Right: Chest) STAPLING, BLEB, LUNG (Right)  Patient Location: PACU  Anesthesia Type:General  Level of Consciousness: awake, alert , and oriented  Airway & Oxygen Therapy: Patient Spontanous Breathing and Patient connected to nasal cannula oxygen  Post-op Assessment: Report given to RN and Post -op Vital signs reviewed and stable  Post vital signs: Reviewed and stable  Last Vitals:  Vitals Value Taken Time  BP  159/97  Temp    Pulse  74  Resp  14  SpO2  97%    Last Pain:  Vitals:   08/29/23 0953  TempSrc: Oral  PainSc: 0-No pain      Patients Stated Pain Goal: 2 (08/28/23 2353)  Complications: No notable events documented.

## 2023-08-30 ENCOUNTER — Inpatient Hospital Stay (HOSPITAL_COMMUNITY)

## 2023-08-30 DIAGNOSIS — J930 Spontaneous tension pneumothorax: Secondary | ICD-10-CM | POA: Diagnosis not present

## 2023-08-30 LAB — BASIC METABOLIC PANEL WITH GFR
Anion gap: 10 (ref 5–15)
BUN: 12 mg/dL (ref 8–23)
CO2: 23 mmol/L (ref 22–32)
Calcium: 9.2 mg/dL (ref 8.9–10.3)
Chloride: 97 mmol/L — ABNORMAL LOW (ref 98–111)
Creatinine, Ser: 0.82 mg/dL (ref 0.61–1.24)
GFR, Estimated: >60 mL/min (ref >60)
Glucose, Bld: 111 mg/dL — ABNORMAL HIGH (ref 70–99)
Potassium: 4 mmol/L (ref 3.5–5.1)
Sodium: 130 mmol/L — ABNORMAL LOW (ref 135–145)

## 2023-08-30 LAB — CBC
HCT: 42 % (ref 39.0–52.0)
Hemoglobin: 14.7 g/dL (ref 13.0–17.0)
MCH: 33.6 pg (ref 26.0–34.0)
MCHC: 35 g/dL (ref 30.0–36.0)
MCV: 95.9 fL (ref 80.0–100.0)
Platelets: 346 K/uL (ref 150–400)
RBC: 4.38 MIL/uL (ref 4.22–5.81)
RDW: 11.9 % (ref 11.5–15.5)
WBC: 12.9 K/uL — ABNORMAL HIGH (ref 4.0–10.5)
nRBC: 0 % (ref 0.0–0.2)

## 2023-08-30 MED ORDER — SODIUM CHLORIDE 0.9 % IV SOLN: INTRAVENOUS | Status: DC

## 2023-08-30 MED ORDER — POLYETHYLENE GLYCOL 3350 17 G PO PACK
17.0000 g | PACK | Freq: Every day | ORAL | Status: DC
  Administered 2023-08-30: 17 g via ORAL
  Filled 2023-08-30 (×2): qty 1

## 2023-08-30 NOTE — Progress Notes (Signed)
 [facility] Consultation Progress Note  Patient: Keith Castro FMW:[id number]   PCP: Tonita Fallow, MD DOB: 06/03/1960   DOA: 08/28/2023   DOS: 08/30/2023   Date of Service: the patient was seen and examined on 08/30/2023 Primary service: Kerrin Elspeth BROCKS, MD   Brief Hospital Course: Patient with PMH of HTN, HLD, active smoker, hypothyroidism, cluster headache presented to the hospital with complaints of chest pain. Initially was seen by pulmonary service.  CT surgery was consulted given presence of pulmonary flap requiring surgical intervention. Underwent VATS with stapling of the bleb Currently under CT surgery service. Has mild AKI and hyponatremia.  Assessment and Plan: Recurrent spontaneous pneumothorax. Status post video-assisted thoracoscopy with right lung stapling and blab. Has a chest tube.  Currently on waterseal. Currently under CT surgery service. Will continue to follow for medical management.   Hypothyroidism. Continuing Synthroid .   HTN. Verapamil . Currently on hold.  Will monitor.  May need to resume it at a lower dose.   Hyponatremia. Sodium 129 on 7/12.  Improved with hydration.  AKI. Baseline serum creatinine around 0.9 or less. On admission serum creatinine was normal.  Serum creatinine was 1.2 postprocedure. Treated with IV fluid with improvement in renal function.  Constipated. Continue bowel regimen.   We will continue to follow the patient.   Subjective: Denies any acute complaints.  No nausea no vomiting, has some constipation.  Pain well-controlled.  Objective: Vitals:   08/30/23 1111 08/30/23 1201 08/30/23 1400 08/30/23 1523  BP: (!) 146/94 128/66 131/77 (!) 145/84  Pulse: 77   76  Resp:  16 17   Temp: 98.3 F (36.8 C)   97.6 F (36.4 C)  TempSrc: Oral   Oral  SpO2: 96%   95%  Weight:      Height:        Clear to auscultation S1-S2 present. Possible edema No edema.  Family Communication: Family at bedside.  Data  Reviewed: Since last encounter, pertinent lab results CBC and BMP   . I have ordered test including CBC and BMP  .   Author: Yetta Blanch, MD  [facility] 08/30/2023  7:02 PM  To reach On-call, Look up on care teams to locate the [facility] team or provider name and reach out to them via secure chat or [email or web address] Between 7PM-7AM, please contact night-coverage. If you still have difficulty reaching the attending provider, please page the [facility] (Director on Call) for [facility] on amion for assistance.

## 2023-08-30 NOTE — Progress Notes (Signed)
 08/30/23 18:35 RN called with concern that Keith Castro was more uncomfortable and had a sensation that something was building up in his chest since transitioning the the chest tube to water seal earlier today.  A stat PCXR showed no PTX but increasing basilar atelectasis.  Will encourage more work on pain mgt and pulmonary hygiene.   EMERSON Becket, PA-C

## 2023-08-30 NOTE — Progress Notes (Addendum)
 1 Day Post-Op Procedure(s) (LRB): VIDEO ASSISTED THORACOSCOPY (Right) STAPLING, BLEB, LUNG (Right) Subjective: Pain comes and goes, no respiratory issues  Objective: Vital signs in last 24 hours: Temp:  [97.6 F (36.4 C)-98.3 F (36.8 C)] 98.2 F (36.8 C) (07/13 0712) Pulse Rate:  [68-74] 69 (07/12 2328) Cardiac Rhythm: Normal sinus rhythm (07/13 0700) Resp:  [11-23] 16 (07/12 2328) BP: (112-159)/(72-98) 133/78 (07/13 0712) SpO2:  [93 %-100 %] 93 % (07/12 2328)  Hemodynamic parameters for last 24 hours:    Intake/Output from previous day: 07/12 0701 - 07/13 0700 In: 1494.3 [P.O.:480; I.V.:814.3; IV Piggyback:200] Out: 1985 [Urine:1725; Blood:10; Chest Tube:250] Intake/Output this shift: No intake/output data recorded.  General appearance: alert, cooperative, and no distress Neurologic: intact Heart: regular rate and rhythm Lungs: clear to auscultation bilaterally No air leak  Lab Results: Recent Labs    08/28/23 0550 08/30/23 0229  WBC 9.1 12.9*  HGB 16.1 14.7  HCT 45.4 42.0  PLT 371 346   BMET:  Recent Labs    08/29/23 0331 08/29/23 2054  NA 129* 130*  K 3.8 4.1  CL 102 97*  CO2 23 22  GLUCOSE 125* 192*  BUN 11 10  CREATININE 0.90 1.34*  CALCIUM 8.9 9.0    PT/INR: No results for input(s): LABPROT, INR in the last 72 hours. ABG No results found for: PHART, HCO3, TCO2, ACIDBASEDEF, O2SAT CBG (last 3)  No results for input(s): GLUCAP in the last 72 hours.  Assessment/Plan: S/P Procedure(s) (LRB): VIDEO ASSISTED THORACOSCOPY (Right) STAPLING, BLEB, LUNG (Right) POD # 1 No air leak- chest tube to water seal IS Creatinine 1.34- monitor, no NSAIDs Enoxaparin  + SCD + ambulation for DVT prophylaxis Ambulate at least twice today Nicotine  gum for cravings Importance of tobacco cessation discussed  LOS: 2 days    Keith Castro 08/30/2023

## 2023-08-30 NOTE — Plan of Care (Signed)
  Problem: Education: Goal: Knowledge of General Education information will improve Description: Including pain rating scale, medication(s)/side effects and non-pharmacologic comfort measures Outcome: Progressing   Problem: Health Behavior/Discharge Planning: Goal: Ability to manage health-related needs will improve Outcome: Progressing   Problem: Clinical Measurements: Goal: Ability to maintain clinical measurements within normal limits will improve Outcome: Progressing Goal: Will remain free from infection Outcome: Progressing Goal: Diagnostic test results will improve Outcome: Progressing Goal: Respiratory complications will improve Outcome: Progressing Goal: Cardiovascular complication will be avoided Outcome: Progressing   Problem: Activity: Goal: Risk for activity intolerance will decrease Outcome: Progressing   Problem: Nutrition: Goal: Adequate nutrition will be maintained Outcome: Progressing   Problem: Coping: Goal: Level of anxiety will decrease Outcome: Progressing   Problem: Elimination: Goal: Will not experience complications related to bowel motility Outcome: Progressing Goal: Will not experience complications related to urinary retention Outcome: Progressing   Problem: Pain Managment: Goal: General experience of comfort will improve and/or be controlled Outcome: Progressing   Problem: Safety: Goal: Ability to remain free from injury will improve Outcome: Progressing   Problem: Skin Integrity: Goal: Risk for impaired skin integrity will decrease Outcome: Progressing   Problem: Education: Goal: Knowledge of General Education information will improve Description: Including pain rating scale, medication(s)/side effects and non-pharmacologic comfort measures Outcome: Progressing   Problem: Health Behavior/Discharge Planning: Goal: Ability to manage health-related needs will improve Outcome: Progressing   Problem: Clinical Measurements: Goal:  Ability to maintain clinical measurements within normal limits will improve Outcome: Progressing Goal: Will remain free from infection Outcome: Progressing Goal: Diagnostic test results will improve Outcome: Progressing Goal: Respiratory complications will improve Outcome: Progressing Goal: Cardiovascular complication will be avoided Outcome: Progressing   Problem: Activity: Goal: Risk for activity intolerance will decrease Outcome: Progressing   Problem: Nutrition: Goal: Adequate nutrition will be maintained Outcome: Progressing   Problem: Coping: Goal: Level of anxiety will decrease Outcome: Progressing   Problem: Elimination: Goal: Will not experience complications related to bowel motility Outcome: Progressing Goal: Will not experience complications related to urinary retention Outcome: Progressing   Problem: Pain Managment: Goal: General experience of comfort will improve and/or be controlled Outcome: Progressing   Problem: Safety: Goal: Ability to remain free from injury will improve Outcome: Progressing   Problem: Skin Integrity: Goal: Risk for impaired skin integrity will decrease Outcome: Progressing   Problem: Education: Goal: Knowledge of disease or condition will improve Outcome: Progressing Goal: Knowledge of the prescribed therapeutic regimen will improve Outcome: Progressing   Problem: Activity: Goal: Risk for activity intolerance will decrease Outcome: Progressing   Problem: Cardiac: Goal: Will achieve and/or maintain hemodynamic stability Outcome: Progressing   Problem: Clinical Measurements: Goal: Postoperative complications will be avoided or minimized Outcome: Progressing   Problem: Respiratory: Goal: Respiratory status will improve Outcome: Progressing   Problem: Pain Management: Goal: Pain level will decrease Outcome: Progressing   Problem: Skin Integrity: Goal: Wound healing without signs and symptoms infection will  improve Outcome: Progressing

## 2023-08-30 NOTE — Hospital Course (Addendum)
 Patient with PMH of HTN, HLD, active smoker, hypothyroidism, cluster headache presented to the hospital with complaints of chest pain. Initially was seen by pulmonary service.  CT surgery was consulted given presence of pulmonary flap requiring surgical intervention. Underwent VATS with stapling of the bleb Currently under CT surgery service. Has mild AKI and hyponatremia.  Assessment and Plan: Recurrent spontaneous pneumothorax. Status post video-assisted thoracoscopy with right lung stapling and blab. Has a chest tube.  Currently on waterseal. Currently under CT surgery service. Will continue to follow for medical management.   Hypothyroidism. Continuing Synthroid .   HTN. Verapamil . Currently on hold.  Will monitor.  May need to resume it at a lower dose.   Hyponatremia. Sodium 129 on 7/12.  Improved with hydration.  AKI. Baseline serum creatinine around 0.9 or less. On admission serum creatinine was normal.  Serum creatinine was 1.2 postprocedure. Treated with IV fluid with improvement in renal function.  Constipated. Continue bowel regimen.

## 2023-08-30 NOTE — Plan of Care (Signed)
  Problem: Education: Goal: Knowledge of General Education information will improve Description: Including pain rating scale, medication(s)/side effects and non-pharmacologic comfort measures Outcome: Progressing   Problem: Health Behavior/Discharge Planning: Goal: Ability to manage health-related needs will improve Outcome: Progressing   Problem: Clinical Measurements: Goal: Ability to maintain clinical measurements within normal limits will improve Outcome: Progressing Goal: Will remain free from infection Outcome: Progressing Goal: Diagnostic test results will improve Outcome: Progressing Goal: Respiratory complications will improve Outcome: Progressing Goal: Cardiovascular complication will be avoided Outcome: Progressing   Problem: Activity: Goal: Risk for activity intolerance will decrease Outcome: Progressing   Problem: Nutrition: Goal: Adequate nutrition will be maintained Outcome: Progressing   Problem: Coping: Goal: Level of anxiety will decrease Outcome: Progressing   Problem: Elimination: Goal: Will not experience complications related to bowel motility Outcome: Progressing Goal: Will not experience complications related to urinary retention Outcome: Progressing   Problem: Pain Managment: Goal: General experience of comfort will improve and/or be controlled Outcome: Progressing   Problem: Safety: Goal: Ability to remain free from injury will improve Outcome: Progressing   Problem: Skin Integrity: Goal: Risk for impaired skin integrity will decrease Outcome: Progressing   Problem: Education: Goal: Knowledge of General Education information will improve Description: Including pain rating scale, medication(s)/side effects and non-pharmacologic comfort measures Outcome: Progressing   Problem: Health Behavior/Discharge Planning: Goal: Ability to manage health-related needs will improve Outcome: Progressing   Problem: Clinical Measurements: Goal:  Ability to maintain clinical measurements within normal limits will improve Outcome: Progressing Goal: Will remain free from infection Outcome: Progressing Goal: Diagnostic test results will improve Outcome: Progressing Goal: Respiratory complications will improve Outcome: Progressing Goal: Cardiovascular complication will be avoided Outcome: Progressing   Problem: Activity: Goal: Risk for activity intolerance will decrease Outcome: Progressing   Problem: Nutrition: Goal: Adequate nutrition will be maintained Outcome: Progressing   Problem: Coping: Goal: Level of anxiety will decrease Outcome: Progressing   Problem: Elimination: Goal: Will not experience complications related to bowel motility Outcome: Progressing Goal: Will not experience complications related to urinary retention Outcome: Progressing   Problem: Pain Managment: Goal: General experience of comfort will improve and/or be controlled Outcome: Progressing   Problem: Skin Integrity: Goal: Risk for impaired skin integrity will decrease Outcome: Progressing   Problem: Safety: Goal: Ability to remain free from injury will improve Outcome: Progressing   Problem: Education: Goal: Knowledge of disease or condition will improve Outcome: Progressing Goal: Knowledge of the prescribed therapeutic regimen will improve Outcome: Progressing   Problem: Activity: Goal: Risk for activity intolerance will decrease Outcome: Progressing   Problem: Cardiac: Goal: Will achieve and/or maintain hemodynamic stability Outcome: Progressing   Problem: Clinical Measurements: Goal: Postoperative complications will be avoided or minimized Outcome: Progressing   Problem: Respiratory: Goal: Respiratory status will improve Outcome: Progressing   Problem: Pain Management: Goal: Pain level will decrease Outcome: Progressing   Problem: Skin Integrity: Goal: Wound healing without signs and symptoms infection will  improve Outcome: Progressing

## 2023-08-31 ENCOUNTER — Inpatient Hospital Stay (HOSPITAL_COMMUNITY)

## 2023-08-31 DIAGNOSIS — Z902 Acquired absence of lung [part of]: Secondary | ICD-10-CM

## 2023-08-31 LAB — COMPREHENSIVE METABOLIC PANEL WITH GFR
ALT: 17 U/L (ref 0–44)
AST: 20 U/L (ref 15–41)
Albumin: 3 g/dL — ABNORMAL LOW (ref 3.5–5.0)
Alkaline Phosphatase: 49 U/L (ref 38–126)
Anion gap: 8 (ref 5–15)
BUN: 5 mg/dL — ABNORMAL LOW (ref 8–23)
CO2: 21 mmol/L — ABNORMAL LOW (ref 22–32)
Calcium: 8.5 mg/dL — ABNORMAL LOW (ref 8.9–10.3)
Chloride: 103 mmol/L (ref 98–111)
Creatinine, Ser: 0.7 mg/dL (ref 0.61–1.24)
GFR, Estimated: >60 mL/min (ref >60)
Glucose, Bld: 117 mg/dL — ABNORMAL HIGH (ref 70–99)
Potassium: 3.9 mmol/L (ref 3.5–5.1)
Sodium: 132 mmol/L — ABNORMAL LOW (ref 135–145)
Total Bilirubin: 0.9 mg/dL (ref 0.0–1.2)
Total Protein: 5.8 g/dL — ABNORMAL LOW (ref 6.5–8.1)

## 2023-08-31 LAB — CBC
HCT: 39.6 % (ref 39.0–52.0)
Hemoglobin: 13.9 g/dL (ref 13.0–17.0)
MCH: 34 pg (ref 26.0–34.0)
MCHC: 35.1 g/dL (ref 30.0–36.0)
MCV: 96.8 fL (ref 80.0–100.0)
Platelets: 298 K/uL (ref 150–400)
RBC: 4.09 MIL/uL — ABNORMAL LOW (ref 4.22–5.81)
RDW: 12.1 % (ref 11.5–15.5)
WBC: 11.9 K/uL — ABNORMAL HIGH (ref 4.0–10.5)
nRBC: 0 % (ref 0.0–0.2)

## 2023-08-31 MED ORDER — GABAPENTIN 300 MG PO CAPS
300.0000 mg | ORAL_CAPSULE | Freq: Three times a day (TID) | ORAL | 0 refills | Status: DC
Start: 2023-08-31 — End: 2023-12-29

## 2023-08-31 MED ORDER — VERAPAMIL HCL ER 240 MG PO TBCR: EXTENDED_RELEASE_TABLET | ORAL | 0 refills | Status: DC

## 2023-08-31 MED ORDER — ACETAMINOPHEN 325 MG PO TABS: 650.0000 mg | ORAL_TABLET | Freq: Four times a day (QID) | ORAL | Status: AC | PRN

## 2023-08-31 MED ORDER — VERAPAMIL HCL ER 240 MG PO TBCR
240.0000 mg | EXTENDED_RELEASE_TABLET | Freq: Every day | ORAL | Status: DC
Start: 2023-08-31 — End: 2023-08-31
  Administered 2023-08-31: 240 mg via ORAL
  Filled 2023-08-31: qty 1

## 2023-08-31 MED ORDER — NICOTINE POLACRILEX 2 MG MT GUM: 2.0000 mg | CHEWING_GUM | OROMUCOSAL | 0 refills | Status: DC | PRN

## 2023-08-31 MED ORDER — NICOTINE 14 MG/24HR TD PT24: 14.0000 mg | MEDICATED_PATCH | Freq: Every day | TRANSDERMAL | 0 refills | Status: DC

## 2023-08-31 MED ORDER — OXYCODONE HCL 5 MG PO TABS: 5.0000 mg | ORAL_TABLET | Freq: Four times a day (QID) | ORAL | 0 refills | Status: AC | PRN

## 2023-08-31 MED ORDER — POLYETHYLENE GLYCOL 3350 17 G PO PACK: 17.0000 g | PACK | Freq: Every day | ORAL | 0 refills | Status: DC

## 2023-08-31 MED ORDER — METHOCARBAMOL 500 MG PO TABS: 500.0000 mg | ORAL_TABLET | Freq: Three times a day (TID) | ORAL | 0 refills | Status: DC | PRN

## 2023-08-31 MED ORDER — PANTOPRAZOLE SODIUM 40 MG PO TBEC: 40.0000 mg | DELAYED_RELEASE_TABLET | Freq: Every day | ORAL | 0 refills | Status: DC

## 2023-08-31 MED ORDER — VERAPAMIL HCL ER 240 MG PO TBCR: EXTENDED_RELEASE_TABLET | ORAL | Status: AC

## 2023-08-31 MED ORDER — LEVOTHYROXINE SODIUM 88 MCG PO TABS: 88.0000 ug | ORAL_TABLET | Freq: Every day | ORAL | 0 refills | Status: DC

## 2023-08-31 MED ORDER — MELOXICAM 15 MG PO TABS: ORAL_TABLET | ORAL | 0 refills | Status: DC

## 2023-08-31 NOTE — Progress Notes (Addendum)
 2 Days Post-Op Procedure(s) (LRB): VIDEO ASSISTED THORACOSCOPY (Right) STAPLING, BLEB, LUNG (Right) Subjective: C/O soreness  Objective: Vital signs in last 24 hours: Temp:  [97.6 F (36.4 C)-98.6 F (37 C)] 98.5 F (36.9 C) (07/14 0257) Pulse Rate:  [75-77] 75 (07/13 1928) Cardiac Rhythm: Normal sinus rhythm (07/13 1845) Resp:  [16-17] 16 (07/14 0257) BP: (128-162)/(66-97) 158/97 (07/14 0257) SpO2:  [95 %-99 %] 99 % (07/14 0257)  Hemodynamic parameters for last 24 hours:    Intake/Output from previous day: 07/13 0701 - 07/14 0700 In: 3451.1 [P.O.:1440; I.V.:2011.1] Out: 2800 [Urine:2450; Chest Tube:350] Intake/Output this shift: No intake/output data recorded.  General appearance: alert, cooperative, and no distress Heart: regular rate and rhythm Lungs: clear to auscultation bilaterally Abdomen: benign Extremities: no edema or calf tenderness Wound: incis healing well  Lab Results: Recent Labs    08/30/23 0229 08/31/23 0235  WBC 12.9* 11.9*  HGB 14.7 13.9  HCT 42.0 39.6  PLT 346 298   BMET:  Recent Labs    08/30/23 1007 08/31/23 0235  NA 130* 132*  K 4.0 3.9  CL 97* 103  CO2 23 21*  GLUCOSE 111* 117*  BUN 12 5*  CREATININE 0.82 0.70  CALCIUM 9.2 8.5*    PT/INR: No results for input(s): LABPROT, INR in the last 72 hours. ABG No results found for: PHART, HCO3, TCO2, ACIDBASEDEF, O2SAT CBG (last 3)  No results for input(s): GLUCAP in the last 72 hours.  Meds Scheduled Meds:  acetaminophen   1,000 mg Oral Q6H   Or   acetaminophen  (TYLENOL ) oral liquid 160 mg/5 mL  1,000 mg Oral Q6H   bisacodyl   10 mg Oral Daily   Chlorhexidine  Gluconate Cloth  6 each Topical Daily   enoxaparin  (LOVENOX ) injection  40 mg Subcutaneous Daily   gabapentin   300 mg Oral TID   levothyroxine   88 mcg Oral Daily   methocarbamol   500 mg Oral TID   nicotine   14 mg Transdermal Daily   pantoprazole   40 mg Oral Daily   polyethylene glycol  17 g Oral Daily    Continuous Infusions:  sodium chloride  100 mL/hr at 08/31/23 0435   PRN Meds:.morphine  injection, nicotine  polacrilex, ondansetron  (ZOFRAN ) IV, oxyCODONE , traMADol   Xrays DG Chest 1V REPEAT Same Day Result Date: 08/30/2023 CLINICAL DATA:  Pneumothorax EXAM: CHEST - 1 VIEW SAME DAY COMPARISON:  08/30/2023 6:17 a.m. FINDINGS: Redemonstrated right-sided chest tube. No definite pneumothorax. Airspace opacities in the right mid and lower lung have slightly increased from prior radiograph and are favored to represent atelectasis. The left lung is clear. No pleural effusion. Stable cardiomediastinal silhouette. No displaced rib fractures. Similar subcutaneous emphysema in the right chest wall and neck. IMPRESSION: 1. No definite pneumothorax. Right-sided chest tube in place. 2. Slightly increased airspace opacities in the right mid and lower lung, favored to represent atelectasis. Electronically Signed   By: Norman Gatlin M.D.   On: 08/30/2023 18:12   DG Chest Port 1 View Result Date: 08/30/2023 EXAM: 1 VIEW XRAY OF THE CHEST 08/30/2023 06:31:55 AM COMPARISON: None available. CLINICAL HISTORY: 226900 S/P thoracotomy. S/P thoracotomy; hx of tension pneumothorax. FINDINGS: LUNGS AND PLEURA: A right-sided chest tube remains in place. No significant pneumothorax is present on today's study. Airspace opacities are noted along the chest tube at the base. The left lung is clear. HEART AND MEDIASTINUM: No acute abnormality of the cardiac and mediastinal silhouettes. BONES AND SOFT TISSUES: No acute osseous abnormality. Subcutaneous emphysema over the right chest wall continues to decrease.  IMPRESSION: 1. Right-sided chest tube in place with associated airspace opacities at the base. 2. No significant pneumothorax. 3. Decreasing subcutaneous emphysema over the right chest wall. Electronically signed by: Lonni Necessary MD 08/30/2023 08:38 AM EDT RP Workstation: [id number]   DG Chest Port 1 View Result Date:  08/29/2023 CLINICAL DATA:  Thoracotomy status post EXAM: PORTABLE CHEST 1 VIEW COMPARISON:  08/28/2023 FINDINGS: Removal of the previous right pleural catheter with placement of a new right chest tube. New emphysema in the right chest wall. Decreased right pneumothorax with residual small apical pneumothorax. No pleural effusion. Streaky atelectasis in the mid and lower lungs. Biapical scarring. Stable cardiomediastinal silhouette. IMPRESSION: Decreased right pneumothorax with residual small apical pneumothorax status post chest tube placement. Electronically Signed   By: Norman Gatlin M.D.   On: 08/29/2023 17:24    Assessment/Plan: S/P Procedure(s) (LRB): VIDEO ASSISTED THORACOSCOPY (Right) STAPLING, BLEB, LUNG (Right) POD#2  1 afeb, s BP 120's-160's, mostly in elevated range 2 O2 sats good on RA 3 excellent UOP 4 CT 350 ml/24 h recorded- no air leak 5 CXR- right lung appears expanded, minor atx/ASD right mid lung/base, minor sub q air on right 6 minor hyponatremia- improved trend, Na++ 132 7 BUN- slightly low, Creat normal 0.70 8 some protein -cal malnutrition, push nutrition as able 9 minor leukocytosis , trend is improving 10- not anemic 11 lovenox  for DVT ppx 12 push rehab and pulm hygiene as able 13 D/W MD- will remove CT and repeat CXR around noon- poss home after that    LOS: 3 days    Lemond FORBES Cera PA-C Pager [phone] 08/31/2023 Patient seen and examined, agree with above Dc chest tube Possibly home later today  Elspeth C. Kerrin, MD [facility] [phone]

## 2023-08-31 NOTE — Progress Notes (Signed)
 Mobility Specialist Progress Note;   08/31/23 1124  Mobility  Activity Ambulated with assistance in hallway  Level of Assistance Standby assist, set-up cues, supervision of patient - no hands on  Assistive Device None  Distance Ambulated (ft) 400 ft  Activity Response Tolerated well  Mobility Referral Yes  Mobility visit 1 Mobility  Mobility Specialist Start Time (ACUTE ONLY) 1124  Mobility Specialist Stop Time (ACUTE ONLY) 1130  Mobility Specialist Time Calculation (min) (ACUTE ONLY) 6 min   Pt agreeable to mobility. Required no physical assistance during ambulation, SV. VSS throughout. Only c/o soreness at chest tube site, otherwise asx. Pt returned back to bed with all needs met, call bell in reach.   Lauraine Erm Mobility Specialist Please contact via SecureChat or [employer] [phone]

## 2023-08-31 NOTE — TOC CM/SW Note (Signed)
 Transition of Care [facility]) - Inpatient Brief Assessment   Patient Details  Name: Keith Castro MRN: [id number] Date of Birth: 12-04-1960  Transition of Care [facility]) CM/SW Contact:    Lauraine FORBES Saa, LCSW Phone Number: 08/31/2023, 9:24 AM   Clinical Narrative:  9:24 AM Per chart review, patient resides at home with spouse. Patient has a PCP and insurance. Patient does not have SNF/HH/DME history. Patient's preferred pharmacy's are [facility] [phone] [place], [facility] [address], [facility] [address] and [facility]). No TOC needs were identified at this time. TOC will continue to follow and be available to assist.  Transition of Care Asessment: Insurance and Status: Insurance coverage has been reviewed Patient has primary care physician: Yes Home environment has been reviewed: Private Residence Prior level of function:: N/A Prior/Current Home Services: No current home services Social Drivers of Health Review: SDOH reviewed no interventions necessary Readmission risk has been reviewed: Yes Transition of care needs: no transition of care needs at this time

## 2023-08-31 NOTE — Plan of Care (Signed)
  Problem: Education: Goal: Knowledge of General Education information will improve Description: Including pain rating scale, medication(s)/side effects and non-pharmacologic comfort measures Outcome: Progressing   Problem: Health Behavior/Discharge Planning: Goal: Ability to manage health-related needs will improve Outcome: Progressing   Problem: Clinical Measurements: Goal: Ability to maintain clinical measurements within normal limits will improve Outcome: Progressing Goal: Will remain free from infection Outcome: Progressing Goal: Diagnostic test results will improve Outcome: Progressing Goal: Respiratory complications will improve Outcome: Progressing Goal: Cardiovascular complication will be avoided Outcome: Progressing   Problem: Activity: Goal: Risk for activity intolerance will decrease Outcome: Progressing   Problem: Nutrition: Goal: Adequate nutrition will be maintained Outcome: Progressing   Problem: Coping: Goal: Level of anxiety will decrease Outcome: Progressing   Problem: Elimination: Goal: Will not experience complications related to bowel motility Outcome: Progressing Goal: Will not experience complications related to urinary retention Outcome: Progressing   Problem: Pain Managment: Goal: General experience of comfort will improve and/or be controlled Outcome: Progressing   Problem: Safety: Goal: Ability to remain free from injury will improve Outcome: Progressing   Problem: Skin Integrity: Goal: Risk for impaired skin integrity will decrease Outcome: Progressing   Problem: Education: Goal: Knowledge of General Education information will improve Description: Including pain rating scale, medication(s)/side effects and non-pharmacologic comfort measures Outcome: Progressing   Problem: Health Behavior/Discharge Planning: Goal: Ability to manage health-related needs will improve Outcome: Progressing   Problem: Clinical Measurements: Goal:  Ability to maintain clinical measurements within normal limits will improve Outcome: Progressing Goal: Will remain free from infection Outcome: Progressing Goal: Diagnostic test results will improve Outcome: Progressing Goal: Respiratory complications will improve Outcome: Progressing Goal: Cardiovascular complication will be avoided Outcome: Progressing   Problem: Activity: Goal: Risk for activity intolerance will decrease Outcome: Progressing   Problem: Nutrition: Goal: Adequate nutrition will be maintained Outcome: Progressing   Problem: Coping: Goal: Level of anxiety will decrease Outcome: Progressing   Problem: Elimination: Goal: Will not experience complications related to bowel motility Outcome: Progressing Goal: Will not experience complications related to urinary retention Outcome: Progressing   Problem: Pain Managment: Goal: General experience of comfort will improve and/or be controlled Outcome: Progressing   Problem: Safety: Goal: Ability to remain free from injury will improve Outcome: Progressing   Problem: Skin Integrity: Goal: Risk for impaired skin integrity will decrease Outcome: Progressing   Problem: Education: Goal: Knowledge of disease or condition will improve Outcome: Progressing Goal: Knowledge of the prescribed therapeutic regimen will improve Outcome: Progressing   Problem: Activity: Goal: Risk for activity intolerance will decrease Outcome: Progressing   Problem: Cardiac: Goal: Will achieve and/or maintain hemodynamic stability Outcome: Progressing   Problem: Clinical Measurements: Goal: Postoperative complications will be avoided or minimized Outcome: Progressing   Problem: Respiratory: Goal: Respiratory status will improve Outcome: Progressing   Problem: Pain Management: Goal: Pain level will decrease Outcome: Progressing   Problem: Skin Integrity: Goal: Wound healing without signs and symptoms infection will  improve Outcome: Progressing

## 2023-08-31 NOTE — TOC Transition Note (Signed)
 Transition of Care [facility]) - Discharge Note   Patient Details  Name: Keith Castro MRN: [id number] Date of Birth: Feb 02, 1961  [facility] Care [facility]) CM/SW Contact:  Roxie KANDICE Stain, RN Phone Number: 08/31/2023, 12:33 PM   Clinical Narrative:    Keith Castro is stable to discharge home. Follow up apt on AVS. No TOC needs at this time.   Final next level of care: Home/Self Care Barriers to Discharge: Barriers Resolved   Patient Goals and CMS Choice Patient states their goals for this hospitalization and ongoing recovery are:: return home          Discharge Placement  home                     Discharge Plan and Services Additional resources added to the After Visit Summary for                                       Social Drivers of Health (SDOH) Interventions SDOH Screenings   Food Insecurity: No Food Insecurity (08/29/2023)  Housing: Low Risk  (08/29/2023)  Transportation Needs: No Transportation Needs (08/29/2023)  Utilities: Not At Risk (08/29/2023)  Depression (PHQ2-9): Low Risk  (03/02/2023)  Social Connections: Moderately Integrated (08/29/2023)  Tobacco Use: High Risk (08/29/2023)     Readmission Risk Interventions    08/31/2023   12:33 PM  Readmission Risk Prevention Plan  Post Dischage Appt Complete  Medication Screening Complete  Transportation Screening Complete

## 2023-08-31 NOTE — Plan of Care (Signed)
  Problem: Education: Goal: Knowledge of General Education information will improve Description: Including pain rating scale, medication(s)/side effects and non-pharmacologic comfort measures Outcome: Progressing   Problem: Health Behavior/Discharge Planning: Goal: Ability to manage health-related needs will improve Outcome: Progressing   Problem: Clinical Measurements: Goal: Ability to maintain clinical measurements within normal limits will improve Outcome: Progressing Goal: Cardiovascular complication will be avoided Outcome: Progressing   Problem: Nutrition: Goal: Adequate nutrition will be maintained Outcome: Progressing   Problem: Elimination: Goal: Will not experience complications related to bowel motility Outcome: Progressing Goal: Will not experience complications related to urinary retention Outcome: Progressing   Problem: Pain Managment: Goal: General experience of comfort will improve and/or be controlled Outcome: Progressing   Problem: Safety: Goal: Ability to remain free from injury will improve Outcome: Progressing   Problem: Skin Integrity: Goal: Risk for impaired skin integrity will decrease Outcome: Progressing   Problem: Education: Goal: Knowledge of General Education information will improve Description: Including pain rating scale, medication(s)/side effects and non-pharmacologic comfort measures Outcome: Progressing   Problem: Health Behavior/Discharge Planning: Goal: Ability to manage health-related needs will improve Outcome: Progressing   Problem: Clinical Measurements: Goal: Will remain free from infection Outcome: Progressing Goal: Respiratory complications will improve Outcome: Progressing   Problem: Activity: Goal: Risk for activity intolerance will decrease Outcome: Progressing   Problem: Elimination: Goal: Will not experience complications related to bowel motility Outcome: Progressing Goal: Will not experience complications  related to urinary retention Outcome: Progressing   Problem: Pain Managment: Goal: General experience of comfort will improve and/or be controlled Outcome: Progressing   Problem: Safety: Goal: Ability to remain free from injury will improve Outcome: Progressing   Problem: Clinical Measurements: Goal: Postoperative complications will be avoided or minimized Outcome: Progressing   Problem: Pain Management: Goal: Pain level will decrease Outcome: Progressing

## 2023-08-31 NOTE — Progress Notes (Signed)
 Patient provided with verbal discharge instructions. Paper copy of discharge provided to patient. RN answered all questions. VSS at discharge. RN returned to room to provide d/c instructions IV hanging on IV pole. IV showing no signs of bleeding. RN dressed site with gauze and tape.  Patient belongings sent with patient. Patient dc'd via wheelchair to private vehicle.

## 2023-09-01 ENCOUNTER — Encounter (HOSPITAL_COMMUNITY): Payer: Self-pay | Admitting: Thoracic Surgery (Cardiothoracic Vascular Surgery)

## 2023-09-01 LAB — SURGICAL PATHOLOGY

## 2023-09-02 ENCOUNTER — Encounter: Payer: Self-pay | Admitting: Student

## 2023-09-02 ENCOUNTER — Ambulatory Visit (INDEPENDENT_AMBULATORY_CARE_PROVIDER_SITE_OTHER): Admitting: Student

## 2023-09-02 VITALS — BP 126/82 | HR 71 | Temp 98.1°F | Ht 70.0 in | Wt 181.0 lb

## 2023-09-02 DIAGNOSIS — F17200 Nicotine dependence, unspecified, uncomplicated: Secondary | ICD-10-CM

## 2023-09-02 DIAGNOSIS — J930 Spontaneous tension pneumothorax: Secondary | ICD-10-CM

## 2023-09-02 MED ORDER — METHOCARBAMOL 500 MG PO TABS: 500.0000 mg | ORAL_TABLET | Freq: Three times a day (TID) | ORAL | 0 refills | Status: DC | PRN

## 2023-09-02 NOTE — Patient Instructions (Signed)
 [facility] at [facility] [address], [place], [place] [address] [phone]

## 2023-09-02 NOTE — Assessment & Plan Note (Signed)
 Currently smoking ~half a ppd. Previously smoking about 3 times as much, has not been using nicotine  patches at home. Discussed using nicotine  patches and gum to help with cravings. He is contemplating cessation. Emphasized importance given history of pneumothorax. He will restart patches. Declined other medical therapy, will think about it if he has trouble cutting down.

## 2023-09-02 NOTE — Progress Notes (Signed)
 Established Patient Office Visit  Subjective   Patient ID: Keith Castro, male    DOB: 18-Aug-1960  Age: 63 y.o. MRN: [id number]  Chief Complaint  Patient presents with   Establish Care    Patient is here today because he was in the hospital and had a thoracoscopy surgery, hospital told him to follow up 1 week with PCP, last PCP passed away. There was no one available in [place] until next month. Here to day for wound care & pain management    Keith Castro is a 63 year old person who presents today for hospital follow up. He was admitted between 08/28/2023 and 08/31/2023 for recurrent R pneumothorax. Previously had R pneumothorax on 08/09/2023. Had VATs for bled resection and pleural abrasion with Dr. Kerrin on 08/29/2023. Has been doing well at home, ambulating, eating, and toileting well, continuing to use the incentive spirometer at home. Denies dyspnea, chest pain, lightheadedness, or dizziness.   Patient Active Problem List   Diagnosis Date Noted   S/P thoracotomy 08/29/2023   Tension pneumothorax 08/28/2023   Hypothyroidism 07/12/2019   Insomnia 07/11/2019   Smoker 07/11/2019   Vitamin D  deficiency 09/26/2013   Medication management 03/31/2013   Hyperlipidemia    Hypertension    Abnormal glucose    Cluster headache       ROS Refer to HPI    Objective:     BP 126/82   Pulse 71   Temp 98.1 F (36.7 C) (Oral)   Ht 5' 10 (1.778 m)   Wt 181 lb (82.1 kg)   SpO2 98%   BMI 25.97 kg/m  BP Readings from Last 3 Encounters:  09/02/23 126/82  08/31/23 129/81  03/02/23 130/86    Physical Exam Constitutional:      Appearance: Normal appearance.  HENT:     Head: Normocephalic and atraumatic.  Cardiovascular:     Rate and Rhythm: Normal rate and regular rhythm.  Pulmonary:     Effort: Pulmonary effort is normal. No respiratory distress.     Comments: Breath sounds ausculted anteriorly and posteriorly, intermittent wheeze, no crackles Abdominal:      General: Abdomen is flat. Bowel sounds are normal. There is no distension.     Palpations: Abdomen is soft.     Tenderness: There is no abdominal tenderness.  Musculoskeletal:        General: Normal range of motion.     Right lower leg: No edema.     Left lower leg: No edema.  Skin:    General: Skin is warm and dry.     Capillary Refill: Capillary refill takes less than 2 seconds.     Comments: Incision sites healing well, no bleeding or drainage, no erythema  Neurological:     General: No focal deficit present.     Mental Status: He is alert and oriented to person, place, and time.  Psychiatric:        Mood and Affect: Mood normal.        Behavior: Behavior normal.        09/02/2023   10:00 AM 03/02/2023   12:17 AM 02/05/2020   10:23 PM  Depression screen PHQ 2/9  Decreased Interest 0 0 0  Down, Depressed, Hopeless 0 0 0  PHQ - 2 Score 0 0 0  Altered sleeping 0    Tired, decreased energy 0    Change in appetite 0    Feeling bad or failure about yourself  0    Trouble concentrating  0    Moving slowly or fidgety/restless 0    Suicidal thoughts 0    PHQ-9 Score 0    Difficult doing work/chores Not difficult at all         09/02/2023   10:00 AM  GAD 7 : Generalized Anxiety Score  Nervous, Anxious, on Edge 0  Control/stop worrying 0  Worry too much - different things 0  Trouble relaxing 0  Restless 0  Easily annoyed or irritable 0  Afraid - awful might happen 0  Total GAD 7 Score 0  Anxiety Difficulty Not difficult at all    No results found for any visits on 09/02/23.  Last CBC Lab Results  Component Value Date   WBC 11.9 (H) 08/31/2023   HGB 13.9 08/31/2023   HCT 39.6 08/31/2023   MCV 96.8 08/31/2023   MCH 34.0 08/31/2023   RDW 12.1 08/31/2023   PLT 298 08/31/2023   Last metabolic panel Lab Results  Component Value Date   GLUCOSE 117 (H) 08/31/2023   NA 132 (L) 08/31/2023   K 3.9 08/31/2023   CL 103 08/31/2023   CO2 21 (L) 08/31/2023   BUN 5 (L)  08/31/2023   CREATININE 0.70 08/31/2023   GFRNONAA >60 08/31/2023   CALCIUM 8.5 (L) 08/31/2023   PROT 5.8 (L) 08/31/2023   ALBUMIN 3.0 (L) 08/31/2023   BILITOT 0.9 08/31/2023   ALKPHOS 49 08/31/2023   AST 20 08/31/2023   ALT 17 08/31/2023   ANIONGAP 8 08/31/2023      The 10-year ASCVD risk score (Arnett DK, et al., 2019) is: 16%    Assessment & Plan:  Tension pneumothorax Assessment & Plan: Dypnea has resolved and doing well since recent VATs and chest tube removal. Pain is manageable on oxycodone  5 mg 4 times daily, gabapentin  300 mg three times daily and robaxin  500 mg every 8 hours. Has suture removed scheduled for 7/24. He can take ibuprofen and tylenol  between doses of oxycodone  as needed for pain. Discussed weaning down as able. Has follow up with CT surgery on 8/5. Will establish with new PCP on 8/11     Smoker Assessment & Plan: Currently smoking ~half a ppd. Previously smoking about 3 times as much, has not been using nicotine  patches at home. Discussed using nicotine  patches and gum to help with cravings. He is contemplating cessation. Emphasized importance given history of pneumothorax. He will restart patches. Declined other medical therapy, will think about it if he has trouble cutting down.    Other orders -     Methocarbamol ; Take 1 tablet (500 mg total) by mouth every 8 (eight) hours as needed for muscle spasms.  Dispense: 15 tablet; Refill: 0     Return if symptoms worsen or fail to improve.    Harlene Saddler, MD

## 2023-09-02 NOTE — Assessment & Plan Note (Addendum)
 Dypnea has resolved and doing well since recent VATs and chest tube removal. Pain is manageable on oxycodone  5 mg 4 times daily, gabapentin  300 mg three times daily and robaxin  500 mg every 8 hours. Has suture removed scheduled for 7/24. He can take ibuprofen and tylenol  between doses of oxycodone  as needed for pain. Discussed weaning down as able. Has follow up with CT surgery on 8/5. Will establish with new PCP on 8/11

## 2023-09-03 ENCOUNTER — Telehealth: Payer: Self-pay

## 2023-09-03 NOTE — Telephone Encounter (Signed)
 Patient's wife contacted and made aware. She acknowledged receipt.

## 2023-09-03 NOTE — Telephone Encounter (Signed)
-----   Message from [name] sent at 09/03/2023  2:01 PM EDT ----- Regarding: RE: Antibiotic needed Not sure who told her that but it wasn't me He got an antibiotic around surgery to prevent wound infection Pneumonia prevention is cough and deep breathing If he is having cough, fever, chills or some other reason she suspects pneumonia should take him to an urgent care  [facility] ----- Message ----- From: Carvin Rosina FALCON, RN Sent: 09/03/2023   9:55 AM EDT To: [name], MD Subject: Antibiotic needed                              Hey,  His wife, Donny called stating you had mentioned him needing an antibiotic to prevent pneumonia? Not sure how accurate this is and wanted to ask you. She said he did not get a prescription at discharge. Please advise.  Thanks, Rosina

## 2023-09-08 ENCOUNTER — Ambulatory Visit: Payer: 59 | Admitting: Internal Medicine

## 2023-09-10 ENCOUNTER — Ambulatory Visit: Attending: Thoracic Surgery (Cardiothoracic Vascular Surgery)

## 2023-09-10 ENCOUNTER — Other Ambulatory Visit: Payer: Self-pay

## 2023-09-10 DIAGNOSIS — Z4802 Encounter for removal of sutures: Secondary | ICD-10-CM

## 2023-09-10 DIAGNOSIS — Z9889 Other specified postprocedural states: Secondary | ICD-10-CM

## 2023-09-10 MED ORDER — OXYCODONE HCL 5 MG PO TABS: 5.0000 mg | ORAL_TABLET | ORAL | 0 refills | Status: DC | PRN

## 2023-09-10 MED ORDER — OXYCODONE HCL 5 MG PO TABS: 5.0000 mg | ORAL_TABLET | ORAL | 0 refills | Status: AC | PRN

## 2023-09-10 NOTE — Progress Notes (Signed)
 Patient arrived for nurse visit to remove suture/staples post- procedure VATS bleb stapling with Dr. Kerrin 08/29/23.  One suture removed with no signs/ symptoms of infection noted.  Patient tolerated procedure well.  Patient instructed to keep the incision sites clean and dry.  Patient acknowledged instructions given.    Patient states he has had a lot of pain at his incision sites with burning. He states that he does not have any pills of oxycodone  left. Spoke with Manuelita, [place] who is going to refill his Oxycodone  to the [facility] pharmacy on [address] in [place]. Patient is aware and acknowledged receipt.

## 2023-09-10 NOTE — Progress Notes (Signed)
 1. S/P thoracotomy (Primary) -Refilled oxycodone  at this time for post operative pain -Continue to use tylenol  as needed for pain -Continue to take gabapentin  as prescribed -Follow up appointment on 09/22/2023

## 2023-09-17 ENCOUNTER — Ambulatory Visit: Payer: Self-pay | Admitting: Internal Medicine

## 2023-09-21 ENCOUNTER — Other Ambulatory Visit: Payer: Self-pay | Admitting: Thoracic Surgery (Cardiothoracic Vascular Surgery)

## 2023-09-21 DIAGNOSIS — Z9889 Other specified postprocedural states: Secondary | ICD-10-CM

## 2023-09-22 ENCOUNTER — Encounter: Payer: Self-pay | Admitting: Thoracic Surgery (Cardiothoracic Vascular Surgery)

## 2023-09-22 ENCOUNTER — Ambulatory Visit
Attending: Thoracic Surgery (Cardiothoracic Vascular Surgery) | Admitting: Thoracic Surgery (Cardiothoracic Vascular Surgery)

## 2023-09-22 ENCOUNTER — Ambulatory Visit (HOSPITAL_COMMUNITY)
Admission: RE | Admit: 2023-09-22 | Discharge: 2023-09-22 | Disposition: A | Discharge status: Home / Self Care | Source: Ambulatory Visit | Attending: Cardiology | Admitting: Cardiology

## 2023-09-22 VITALS — BP 106/74 | HR 56 | Resp 20 | Ht 70.0 in | Wt 181.0 lb

## 2023-09-22 DIAGNOSIS — Z9889 Other specified postprocedural states: Secondary | ICD-10-CM

## 2023-09-22 NOTE — Progress Notes (Signed)
 [address], [place] [address]             [phone]      HPI: Keith Castro returns for follow-up after recent bleb resection for spontaneous pneumothorax  Keith Castro is a 63 year old male with a history of tobacco use who presented with a recurrent right pneumothorax.  He underwent right VATS for bleb resection, pleural stripping and abrasion.  His chest tube was removed and he went home on postoperative day #2.  He has been feeling well.  Continues to smoke but he says he has cut his intake in half.  Some soreness particularly when he lies on his right side, but not taking any oxycodone .  Anxious to return to work in near future.  Past Medical History:  Diagnosis Date   Cluster headache    Hyperlipidemia    Hypertension    Prediabetes    Vitamin D  deficiency     Current Outpatient Medications  Medication Sig Dispense Refill   acetaminophen  (TYLENOL ) 325 MG tablet Take 2 tablets (650 mg total) by mouth every 6 (six) hours as needed.     Ascorbic Acid (VITAMIN C PO) Take 1 tablet by mouth daily.     aspirin 81 MG tablet Take 81 mg by mouth daily.      Cholecalciferol (VITAMIN D -3 PO) Take 1 capsule by mouth daily.     gabapentin  (NEURONTIN ) 300 MG capsule Take 1 capsule (300 mg total) by mouth 3 (three) times daily. 45 capsule 0   ibuprofen (ADVIL) 200 MG tablet Take 400 mg by mouth daily as needed for headache or moderate pain (pain score 4-6).     levothyroxine  (SYNTHROID ) 88 MCG tablet Take 1 tablet (88 mcg total) by mouth daily. 30 tablet 0   meloxicam  (MOBIC ) 15 MG tablet TAKE 1/2 TO 1 TAB DAILY WITH FOOD FOR PAIN/INFLAMMATION LIMIT TO 5 TABS/WEEK TO AVOID KIDNEY DAMAGE 30 tablet 0   methocarbamol  (ROBAXIN ) 500 MG tablet Take 1 tablet (500 mg total) by mouth every 8 (eight) hours as needed for muscle spasms. 15 tablet 0   MILK THISTLE PO Take 1 tablet by mouth daily.     Multiple Vitamins-Minerals (ZINC PO) Take 1 tablet by mouth daily.      nicotine  (NICODERM CQ  - DOSED IN MG/24 HOURS) 14 mg/24hr patch Place 1 patch (14 mg total) onto the skin daily. 28 patch 0   nicotine  polacrilex (NICORETTE ) 2 MG gum Take 1 each (2 mg total) by mouth every 2 (two) hours as needed for smoking cessation. 100 tablet 0   pantoprazole  (PROTONIX ) 40 MG tablet Take 1 tablet (40 mg total) by mouth daily for 14 days. 14 tablet 0   polyethylene glycol (MIRALAX  / GLYCOLAX ) 17 g packet Take 17 g by mouth daily. 14 each 0   verapamil  (CALAN -SR) 240 MG CR tablet TAKE 1 TABLET DAILY FOR BP & CLUSTER HA PROPHYLAXIS     No current facility-administered medications for this visit.    Physical Exam BP 106/74   Pulse (!) 56   Resp 20   Ht 5' 10 (1.778 m)   Wt 181 lb (82.1 kg)   SpO2 97% Comment: RA  BMI 25.55 kg/m  63 year old man in no acute distress Alert and oriented x 3 with no focal deficits Lungs clear bilaterally Incisions healing well  Diagnostic Tests: I personally reviewed his chest x-ray images.  Postoperative changes.  No pneumothorax.  Impression: Keith Castro  is a 63 year old man with a history of tobacco abuse who had a recurrent spontaneous pneumothorax.  He underwent right VATS for bleb resection, and pleural stripping and abrasion.  Postoperative course was unremarkable.  Currently is doing well.  Has some discomfort but is not requiring any narcotics.  Is anxious to return to work.  We set a tentative return to work date of October 05, 2023.  Advised him to avoid any heavy lifting until 6 weeks postop and then his activities are unrestricted after that.  Tobacco use-emphasized the importance of complete cessation.  He is in the process of establishing a new primary care and then will start Chantix.  Plan: I will plan to see him back in about 2 months just to check on his progress.  Will do a PA and lateral chest x-ray at that time.  Keith Castro Millers, MD [facility] [phone]

## 2023-09-23 ENCOUNTER — Other Ambulatory Visit: Payer: Self-pay

## 2023-09-23 DIAGNOSIS — Z9889 Other specified postprocedural states: Secondary | ICD-10-CM

## 2023-09-23 MED ORDER — TRAMADOL HCL 50 MG PO TABS: 50.0000 mg | ORAL_TABLET | Freq: Four times a day (QID) | ORAL | 0 refills | Status: AC | PRN

## 2023-09-23 NOTE — Progress Notes (Signed)
 S/P Right video-assisted thoracoscopy, bleb resection, pleural stripping, and abrasion -Sent in tramadol  to be used as needed for pain  -Continue with tylenol  as needed for pain

## 2023-09-28 ENCOUNTER — Ambulatory Visit (INDEPENDENT_AMBULATORY_CARE_PROVIDER_SITE_OTHER): Payer: Self-pay | Admitting: Family Medicine

## 2023-09-28 ENCOUNTER — Encounter: Payer: Self-pay | Admitting: Family Medicine

## 2023-09-28 VITALS — BP 132/84 | HR 65 | Temp 98.3°F | Ht 70.0 in | Wt 177.0 lb

## 2023-09-28 DIAGNOSIS — I10 Essential (primary) hypertension: Secondary | ICD-10-CM | POA: Diagnosis not present

## 2023-09-28 DIAGNOSIS — M1990 Unspecified osteoarthritis, unspecified site: Secondary | ICD-10-CM

## 2023-09-28 DIAGNOSIS — J3089 Other allergic rhinitis: Secondary | ICD-10-CM | POA: Diagnosis not present

## 2023-09-28 DIAGNOSIS — E782 Mixed hyperlipidemia: Secondary | ICD-10-CM

## 2023-09-28 DIAGNOSIS — E039 Hypothyroidism, unspecified: Secondary | ICD-10-CM

## 2023-09-28 DIAGNOSIS — R7309 Other abnormal glucose: Secondary | ICD-10-CM

## 2023-09-28 DIAGNOSIS — Z7689 Persons encountering health services in other specified circumstances: Secondary | ICD-10-CM

## 2023-09-28 MED ORDER — MELOXICAM 7.5 MG PO TABS: 7.5000 mg | ORAL_TABLET | Freq: Every day | ORAL | 1 refills | Status: DC

## 2023-09-28 MED ORDER — MONTELUKAST SODIUM 10 MG PO TABS: 10.0000 mg | ORAL_TABLET | Freq: Every day | ORAL | 1 refills | Status: DC

## 2023-09-28 NOTE — Progress Notes (Signed)
 New Patient Office Visit  Subjective   Patient ID: Keith Castro, male    DOB: October 21, 1960  Age: 63 y.o. MRN: [id number]  CC:  Chief Complaint  Patient presents with   Establish Care    HPI Keith Castro presents to establish care with new provider.   Patients previous primary care provider: [facility] with Dr. Elsie Richards. Last seen in January 2025.  Specialist: [facility] with Dr. Elspeth Millers   Hypothyroidism: Chronic. Patient is taking Levothyroxine  88mcg daily. Based on previous PCP note, diagnosis in May 2021.   Lab Results  Component Value Date   TSH 4.26 03/02/2023   Osteoarthritis: Chronic. Patient is taking Meloxicam  15mg  every other day, but patient would like to take 7.5mg  daily. He reports he has arthritis mainly in his hands and knees.   HTN: Chronic. Patient is taking Verapamil  240mg  daily. Monitors it periodically. Denies CP, SHOB, HA, dizziness, and lower extremity edema. Based on previous PCP note, HTN predates since 2004.  BP Readings from Last 3 Encounters:  09/28/23 132/84  09/22/23 106/74  09/02/23 126/82    Seasonal and Environmental allergies: Chronic. Patient is taking his spouses Montelukast  10mg  nightly and it helps. He is requesting medication.  Outpatient Encounter Medications as of 09/28/2023  Medication Sig   acetaminophen  (TYLENOL ) 325 MG tablet Take 2 tablets (650 mg total) by mouth every 6 (six) hours as needed.   Ascorbic Acid (VITAMIN C PO) Take 1 tablet by mouth daily.   aspirin 81 MG tablet Take 81 mg by mouth daily.    Cholecalciferol (VITAMIN D -3 PO) Take 1 capsule by mouth daily.   gabapentin  (NEURONTIN ) 300 MG capsule Take 1 capsule (300 mg total) by mouth 3 (three) times daily.   ibuprofen (ADVIL) 200 MG tablet Take 400 mg by mouth daily as needed for headache or moderate pain (pain score 4-6).   levothyroxine  (SYNTHROID ) 88 MCG tablet Take 1 tablet (88 mcg  total) by mouth daily.   meloxicam  (MOBIC ) 7.5 MG tablet Take 1 tablet (7.5 mg total) by mouth daily.   MILK THISTLE PO Take 1 tablet by mouth daily.   montelukast  (SINGULAIR ) 10 MG tablet Take 1 tablet (10 mg total) by mouth at bedtime.   Multiple Vitamins-Minerals (ZINC PO) Take 1 tablet by mouth daily.   nicotine  (NICODERM CQ  - DOSED IN MG/24 HOURS) 14 mg/24hr patch Place 1 patch (14 mg total) onto the skin daily.   pantoprazole  (PROTONIX ) 40 MG tablet Take 1 tablet (40 mg total) by mouth daily for 14 days.   [EXPIRED] traMADol  (ULTRAM ) 50 MG tablet Take 1 tablet (50 mg total) by mouth every 6 (six) hours as needed for up to 5 days.   verapamil  (CALAN -SR) 240 MG CR tablet TAKE 1 TABLET DAILY FOR BP & CLUSTER HA PROPHYLAXIS   [DISCONTINUED] meloxicam  (MOBIC ) 15 MG tablet TAKE 1/2 TO 1 TAB DAILY WITH FOOD FOR PAIN/INFLAMMATION LIMIT TO 5 TABS/WEEK TO AVOID KIDNEY DAMAGE   [DISCONTINUED] methocarbamol  (ROBAXIN ) 500 MG tablet Take 1 tablet (500 mg total) by mouth every 8 (eight) hours as needed for muscle spasms.   [DISCONTINUED] nicotine  polacrilex (NICORETTE ) 2 MG gum Take 1 each (2 mg total) by mouth every 2 (two) hours as needed for smoking cessation.   [DISCONTINUED] polyethylene glycol (MIRALAX  / GLYCOLAX ) 17 g packet Take 17 g by mouth daily.   No facility-administered encounter medications on file as of 09/28/2023.    Past Medical History:  Diagnosis Date   Cluster headache    Hyperlipidemia    Hypertension    Pneumothorax    x 3   Prediabetes    Vitamin D  deficiency     Past Surgical History:  Procedure Laterality Date   APPENDECTOMY  02/17/1970   INCISE AND DRAIN ABCESS  02/17/2006   right shoulder   PLEURAL SCARIFICATION Left 02/17/1977   chest tube thoracostomy   STAPLING OF BLEBS Right 08/29/2023   Procedure: STAPLING, BLEB, LUNG;  Surgeon: Kerrin Elspeth BROCKS, MD;  Location: [facility] OR;  Service: Thoracic;  Laterality: Right;  with pleural abrasion   VIDEO ASSISTED  THORACOSCOPY Right 08/29/2023   Procedure: VIDEO ASSISTED THORACOSCOPY;  Surgeon: Kerrin Elspeth BROCKS, MD;  Location: [facility] OR;  Service: Thoracic;  Laterality: Right;    Family History  Problem Relation Age of Onset   Hypertension Mother    Osteoporosis Mother    Stroke Father    Heart attack Maternal Grandfather     Social History   Socioeconomic History   Marital status: Married    Spouse name: Not on file   Number of children: 0   Years of education: Not on file   Highest education level: Some college, no degree  Occupational History   Not on file  Tobacco Use   Smoking status: Every Day    Current packs/day: 0.75    Average packs/day: 0.8 packs/day for 50.6 years (38.0 ttl pk-yrs)    Types: Cigarettes    Start date: 1975   Smokeless tobacco: Never  Vaping Use   Vaping status: Never Used  Substance and Sexual Activity   Alcohol use: Yes    Comment: 2-3 beers a day, if he drinks that day   Drug use: No   Sexual activity: Not on file  Other Topics Concern   Not on file  Social History Narrative   Not on file   Social Drivers of Health   Financial Resource Strain: Low Risk  (09/21/2023)   Overall Financial Resource Strain (CARDIA)    Difficulty of Paying Living Expenses: Not hard at all  Food Insecurity: No Food Insecurity (09/21/2023)   Hunger Vital Sign    Worried About Running Out of Food in the Last Year: Never true    Ran Out of Food in the Last Year: Never true  Transportation Needs: No Transportation Needs (09/21/2023)   PRAPARE - Administrator, Civil Service (Medical): No    Lack of Transportation (Non-Medical): No  Physical Activity: Sufficiently Active (09/21/2023)   Exercise Vital Sign    Days of Exercise per Week: 5 days    Minutes of Exercise per Session: 60 min  Stress: No Stress Concern Present (09/21/2023)   [employer] of Occupational Health - Occupational Stress Questionnaire    Feeling of Stress: Not at all  Social Connections:  Moderately Isolated (09/21/2023)   Social Connection and Isolation Panel    Frequency of Communication with Friends and Family: More than three times a week    Frequency of Social Gatherings with Friends and Family: More than three times a week    Attends Religious Services: Patient declined    Database administrator or Organizations: No    Attends Banker Meetings: Not on file    Marital Status: Married  Intimate Partner Violence: Not At Risk (08/29/2023)   Humiliation, Afraid, Rape, and Kick questionnaire    Fear of Current or Ex-Partner: No    Emotionally Abused: No  Physically Abused: No    Sexually Abused: No    ROS See HPI above    Objective  BP 132/84   Pulse 65   Temp 98.3 F (36.8 C) (Oral)   Ht 5' 10 (1.778 m)   Wt 177 lb (80.3 kg)   SpO2 98%   BMI 25.40 kg/m   Physical Exam Vitals reviewed.  Constitutional:      General: He is not in acute distress.    Appearance: Normal appearance. He is not ill-appearing, toxic-appearing or diaphoretic.  HENT:     Head: Normocephalic and atraumatic.  Eyes:     General:        Right eye: No discharge.        Left eye: No discharge.     Conjunctiva/sclera: Conjunctivae normal.  Cardiovascular:     Rate and Rhythm: Normal rate and regular rhythm.     Heart sounds: Normal heart sounds. No murmur heard.    No friction rub. No gallop.  Pulmonary:     Effort: Pulmonary effort is normal. No respiratory distress.     Breath sounds: Normal breath sounds.  Musculoskeletal:        General: Normal range of motion.  Skin:    General: Skin is warm and dry.  Neurological:     General: No focal deficit present.     Mental Status: He is alert and oriented to person, place, and time. Mental status is at baseline.  Psychiatric:        Mood and Affect: Mood normal.        Behavior: Behavior normal.        Thought Content: Thought content normal.        Judgment: Judgment normal.      Assessment & Plan:   Hypothyroidism, unspecified type Assessment & Plan: Stable. Continue Levothyroxine  88mcg daily. Ordered TSH.   Orders: -     TSH; Future  Osteoarthritis, unspecified osteoarthritis type, unspecified site Assessment & Plan: Changed prescription to Meloxicam  7.5mg  tablet daily. Encouraged to not take additional NSAIDS.   Orders: -     Meloxicam ; Take 1 tablet (7.5 mg total) by mouth daily.  Dispense: 90 tablet; Refill: 1  Environmental and seasonal allergies Assessment & Plan: Prescribed Montelukast  10mg  nightly since it has been effective with taking spouses medication.   Orders: -     Montelukast  Sodium; Take 1 tablet (10 mg total) by mouth at bedtime.  Dispense: 90 tablet; Refill: 1  Primary hypertension Assessment & Plan: Stable. Continue Verapamil  240mg  daily. Ordered CBC and CMP.   Orders: -     CBC with Differential/Platelet; Future -     Comprehensive metabolic panel with GFR; Future  Mixed hyperlipidemia Assessment & Plan: Not taking medication. Ordered lipid panel and CMP. Patient is not fasting and will make lab appointment when fasting.   Orders: -     Comprehensive metabolic panel with GFR; Future -     Lipid panel; Future  Abnormal glucose Assessment & Plan: Not taking medication. Ordered A1c.   Orders: -     Hemoglobin A1c; Future  Encounter to establish care  1.Review health maintenance:  -Colonoscopy: Would like to hold on referral  -Covid booster: Declines  -Influenza vaccine: Takes with employer  -PNA vaccine: Will obtain later -Zoster vaccine: Will obtain later   Return in about 3 months (around 12/29/2023) for chronic management; Lab-when fasting .   Kiyoko Mcguirt, NP

## 2023-09-29 DIAGNOSIS — M1990 Unspecified osteoarthritis, unspecified site: Secondary | ICD-10-CM | POA: Insufficient documentation

## 2023-09-29 DIAGNOSIS — J3089 Other allergic rhinitis: Secondary | ICD-10-CM | POA: Insufficient documentation

## 2023-09-29 NOTE — Assessment & Plan Note (Signed)
 Not taking medication. Ordered A1c.

## 2023-09-29 NOTE — Assessment & Plan Note (Signed)
 Prescribed Montelukast  10mg  nightly since it has been effective with taking spouses medication.

## 2023-09-29 NOTE — Assessment & Plan Note (Signed)
 Changed prescription to Meloxicam  7.5mg  tablet daily. Encouraged to not take additional NSAIDS.

## 2023-09-29 NOTE — Patient Instructions (Addendum)
-  It was nice to meet you and look forward to taking care of you.  -Ordered labs. Since not fasting, please make a lab appointment when fasting. Office will call with lab results and will be available via MyChart.  -Prescribed Montelukast  10mg  daily allergies and changed Meloxicam  prescription for osteoarthritis to 7.5mg  tablet daily. Please do not take additional NSAIDS, such as Advil, Ibuprofen, Aleve, or Naproxen while taking Meloxicam .  -Continue all other medications.  -Follow up in 3 months for chronic management.

## 2023-09-29 NOTE — Assessment & Plan Note (Signed)
 Stable. Continue Verapamil  240mg  daily. Ordered CBC and CMP.

## 2023-09-29 NOTE — Assessment & Plan Note (Signed)
 Stable. Continue Levothyroxine  88mcg daily. Ordered TSH.

## 2023-09-29 NOTE — Assessment & Plan Note (Signed)
 Not taking medication. Ordered lipid panel and CMP. Patient is not fasting and will make lab appointment when fasting.

## 2023-10-15 ENCOUNTER — Other Ambulatory Visit (INDEPENDENT_AMBULATORY_CARE_PROVIDER_SITE_OTHER)

## 2023-10-15 DIAGNOSIS — R7309 Other abnormal glucose: Secondary | ICD-10-CM

## 2023-10-15 DIAGNOSIS — I10 Essential (primary) hypertension: Secondary | ICD-10-CM | POA: Diagnosis not present

## 2023-10-15 DIAGNOSIS — E782 Mixed hyperlipidemia: Secondary | ICD-10-CM

## 2023-10-15 DIAGNOSIS — E039 Hypothyroidism, unspecified: Secondary | ICD-10-CM

## 2023-10-15 LAB — LIPID PANEL
Cholesterol: 127 mg/dL (ref 0–200)
HDL: 36.4 mg/dL — ABNORMAL LOW (ref >39.00)
LDL Cholesterol: 76 mg/dL (ref 0–99)
NonHDL: 91.07
Total CHOL/HDL Ratio: 4
Triglycerides: 75 mg/dL (ref 0.0–149.0)
VLDL: 15 mg/dL (ref 0.0–40.0)

## 2023-10-15 LAB — CBC WITH DIFFERENTIAL/PLATELET
Basophils Absolute: 0 K/uL (ref 0.0–0.1)
Basophils Relative: 0.6 % (ref 0.0–3.0)
Eosinophils Absolute: 0.1 K/uL (ref 0.0–0.7)
Eosinophils Relative: 2.1 % (ref 0.0–5.0)
HCT: 44.7 % (ref 39.0–52.0)
Hemoglobin: 15.4 g/dL (ref 13.0–17.0)
Lymphocytes Relative: 27.1 % (ref 12.0–46.0)
Lymphs Abs: 1.2 K/uL (ref 0.7–4.0)
MCHC: 34.4 g/dL (ref 30.0–36.0)
MCV: 95.2 fl (ref 78.0–100.0)
Monocytes Absolute: 0.7 K/uL (ref 0.1–1.0)
Monocytes Relative: 16.3 % — ABNORMAL HIGH (ref 3.0–12.0)
Neutro Abs: 2.3 K/uL (ref 1.4–7.7)
Neutrophils Relative %: 53.9 % (ref 43.0–77.0)
Platelets: 282 K/uL (ref 150.0–400.0)
RBC: 4.69 Mil/uL (ref 4.22–5.81)
RDW: 13.6 % (ref 11.5–15.5)
WBC: 4.3 K/uL (ref 4.0–10.5)

## 2023-10-15 LAB — TSH: TSH: 2.21 u[IU]/mL (ref 0.35–5.50)

## 2023-10-15 LAB — COMPREHENSIVE METABOLIC PANEL WITH GFR
ALT: 18 U/L (ref 0–53)
AST: 20 U/L (ref 0–37)
Albumin: 4.2 g/dL (ref 3.5–5.2)
Alkaline Phosphatase: 71 U/L (ref 39–117)
BUN: 10 mg/dL (ref 6–23)
CO2: 24 meq/L (ref 19–32)
Calcium: 9.4 mg/dL (ref 8.4–10.5)
Chloride: 99 meq/L (ref 96–112)
Creatinine, Ser: 0.83 mg/dL (ref 0.40–1.50)
GFR: 91.87 mL/min (ref >60.00)
Glucose, Bld: 89 mg/dL (ref 70–99)
Potassium: 4.1 meq/L (ref 3.5–5.1)
Sodium: 135 meq/L (ref 135–145)
Total Bilirubin: 0.8 mg/dL (ref 0.2–1.2)
Total Protein: 7.2 g/dL (ref 6.0–8.3)

## 2023-10-15 LAB — HEMOGLOBIN A1C: Hgb A1c MFr Bld: 6 % (ref 4.6–6.5)

## 2023-10-16 ENCOUNTER — Ambulatory Visit: Payer: Self-pay | Admitting: Family Medicine

## 2023-10-17 ENCOUNTER — Encounter: Payer: Self-pay | Admitting: Family Medicine

## 2023-10-20 ENCOUNTER — Other Ambulatory Visit: Payer: Self-pay

## 2023-10-20 MED ORDER — NICOTINE 14 MG/24HR TD PT24: 14.0000 mg | MEDICATED_PATCH | Freq: Every day | TRANSDERMAL | 0 refills | Status: DC

## 2023-11-23 ENCOUNTER — Other Ambulatory Visit: Payer: Self-pay | Admitting: Thoracic Surgery (Cardiothoracic Vascular Surgery)

## 2023-11-23 DIAGNOSIS — Z9889 Other specified postprocedural states: Secondary | ICD-10-CM

## 2023-11-24 ENCOUNTER — Ambulatory Visit (HOSPITAL_COMMUNITY)
Admission: RE | Admit: 2023-11-24 | Discharge: 2023-11-24 | Disposition: A | Discharge status: Home / Self Care | Source: Ambulatory Visit | Attending: Student in an Organized Health Care Education/Training Program | Admitting: Student in an Organized Health Care Education/Training Program

## 2023-11-24 ENCOUNTER — Ambulatory Visit: Attending: Thoracic Surgery (Cardiothoracic Vascular Surgery)

## 2023-11-24 VITALS — BP 138/86 | HR 62 | Resp 20 | Ht 70.0 in | Wt 188.5 lb

## 2023-11-24 DIAGNOSIS — Z9889 Other specified postprocedural states: Secondary | ICD-10-CM | POA: Insufficient documentation

## 2023-11-24 NOTE — Patient Instructions (Signed)
-  Follow up in one year with [facility] with Low dose CT scan for lung cancer screening -Follow up with PCP do discuss chantix for smoking cessation

## 2023-11-24 NOTE — Progress Notes (Signed)
 [address] [place] [address]             [id number]       HPI:  Keith Castro is a 63 year old man with medical history of hypertension, hypothyroidism, current smoker, headaches, and hyperlipidemia who returns for routine postoperative follow-up having undergone right VATS for bleb resection, pleural stripping and abrasion on 08/29/2023 with Dr. Kerrin for spontaneous pneumothorax.   He presents to the clinic for continued post operative follow up and overall he has been doing well.  His pain is well controlled without the use of any medications.  His job requires a lot of walking and he is able to do this without shortness of breath.   He is still smoking half a pack a day of cigarettes.  This is decreased from a pack and a half.  He is using the patch in conjunction with slowly decreasing the number of cigarettes.  He is interested in chantix and has an appointment in one month with his PCP.    Allergies as of 11/24/2023       Reactions   Topamax [topiramate] Other (See Comments)   Confusion Brain fog        Medication List        Accurate as of November 24, 2023  3:25 PM. If you have any questions, ask your nurse or doctor.          acetaminophen  325 MG tablet Commonly known as: Tylenol  Take 2 tablets (650 mg total) by mouth every 6 (six) hours as needed.   aspirin 81 MG tablet Take 81 mg by mouth daily.   gabapentin  300 MG capsule Commonly known as: NEURONTIN  Take 1 capsule (300 mg total) by mouth 3 (three) times daily.   ibuprofen 200 MG tablet Commonly known as: ADVIL Take 400 mg by mouth daily as needed for headache or moderate pain (pain score 4-6).   levothyroxine  88 MCG tablet Commonly known as: Synthroid  Take 1 tablet (88 mcg total) by mouth daily.   meloxicam  7.5 MG tablet Commonly known as: MOBIC  Take 1 tablet (7.5 mg total) by mouth daily.   MILK THISTLE PO Take 1 tablet by mouth daily.   montelukast   10 MG tablet Commonly known as: SINGULAIR  Take 1 tablet (10 mg total) by mouth at bedtime.   nicotine  14 mg/24hr patch Commonly known as: NICODERM CQ  - dosed in mg/24 hours Place 1 patch (14 mg total) onto the skin daily.   pantoprazole  40 MG tablet Commonly known as: PROTONIX  Take 1 tablet (40 mg total) by mouth daily for 14 days.   verapamil  240 MG CR tablet Commonly known as: CALAN -SR TAKE 1 TABLET DAILY FOR BP & CLUSTER HA PROPHYLAXIS   VITAMIN C PO Take 1 tablet by mouth daily.   VITAMIN D -3 PO Take 1 capsule by mouth daily.   ZINC PO Take 1 tablet by mouth daily.         ROS Review of Systems  Constitutional: Negative.  Negative for fever and malaise/fatigue.  Respiratory: Negative.  Negative for cough, shortness of breath and wheezing.   Cardiovascular:  Negative for chest pain and palpitations.      BP 138/86 (BP Location: Left Arm, Patient Position: Sitting, Cuff Size: Normal)   Pulse 62   Resp 20   Ht 5' 10 (1.778 m)   Wt 188 lb 8 oz (85.5 kg)   SpO2 98% Comment: RA  BMI 27.05 kg/m    Physical Exam Constitutional:      Appearance: Normal appearance.  HENT:     Head: Normocephalic and atraumatic.  Cardiovascular:     Rate and Rhythm: Normal rate and regular rhythm.     Heart sounds: Normal heart sounds, S1 normal and S2 normal.  Pulmonary:     Effort: Pulmonary effort is normal.     Breath sounds: Normal breath sounds.  Skin:    General: Skin is warm and dry.  Neurological:     General: No focal deficit present.     Mental Status: He is alert and oriented to person, place, and time.      Imaging: CLINICAL DATA:  Status post video assisted thoracic surgery.   EXAM: CHEST - 2 VIEW   COMPARISON:  September 22, 2023   FINDINGS: The heart size and mediastinal contours are within normal limits. No pneumothorax pleural effusion is noted. Postsurgical changes are noted anteriorly in right lung. Left lung is unremarkable. The visualized  skeletal structures are unremarkable.   IMPRESSION: Postsurgical changes are noted in right lung. No acute abnormality seen.     Electronically Signed   By: Lynwood Landy Raddle M.D.   On: 11/24/2023 15:27   Assessment/Plan:  S/P thoracotomy -We reviewed today's chest xray.  -Discussed smoking cessation and he is doing well.  Encouraged complete cessation which he has achieved before with chantix.  He is motivated and will discuss chantix with PCP -He is to stay active and continue to increase his exercise/activity as tolerated -Based of smoking history discussed the need for lung cancer screening CT scans.  He would like to continue to be followed by our clinic for this  -Follow up scheduled in one year with low dose CT scan for lung cancer screening  [name], PA-C 3:25 PM 11/24/23

## 2023-11-25 ENCOUNTER — Other Ambulatory Visit: Payer: Self-pay | Admitting: Family Medicine

## 2023-11-25 DIAGNOSIS — J3089 Other allergic rhinitis: Secondary | ICD-10-CM

## 2023-11-27 ENCOUNTER — Other Ambulatory Visit: Payer: Self-pay | Admitting: Family Medicine

## 2023-12-09 ENCOUNTER — Ambulatory Visit: Payer: 59 | Admitting: Nurse Practitioner

## 2023-12-25 ENCOUNTER — Other Ambulatory Visit: Payer: Self-pay | Admitting: Family Medicine

## 2023-12-29 ENCOUNTER — Ambulatory Visit (INDEPENDENT_AMBULATORY_CARE_PROVIDER_SITE_OTHER): Admitting: Family Medicine

## 2023-12-29 VITALS — BP 128/84 | HR 62 | Temp 97.9°F | Ht 70.0 in | Wt 189.0 lb

## 2023-12-29 DIAGNOSIS — J3089 Other allergic rhinitis: Secondary | ICD-10-CM | POA: Diagnosis not present

## 2023-12-29 DIAGNOSIS — I10 Essential (primary) hypertension: Secondary | ICD-10-CM

## 2023-12-29 DIAGNOSIS — M1990 Unspecified osteoarthritis, unspecified site: Secondary | ICD-10-CM | POA: Diagnosis not present

## 2023-12-29 DIAGNOSIS — Z1211 Encounter for screening for malignant neoplasm of colon: Secondary | ICD-10-CM | POA: Diagnosis not present

## 2023-12-29 DIAGNOSIS — E039 Hypothyroidism, unspecified: Secondary | ICD-10-CM | POA: Diagnosis not present

## 2023-12-29 DIAGNOSIS — F17200 Nicotine dependence, unspecified, uncomplicated: Secondary | ICD-10-CM

## 2023-12-29 MED ORDER — MELOXICAM 7.5 MG PO TABS: 7.5000 mg | ORAL_TABLET | Freq: Every day | ORAL | 1 refills | Status: AC

## 2023-12-29 NOTE — Patient Instructions (Signed)
-  It was great to see you today.  -Placed a referral to GI for colonoscopy. Please call the office or send a MyChart message if you do not receive a phone call or a MyChart message about appointment in 2 weeks.  -Continue all medications. Refilled Meloxicam .  -Follow up in February for a physical and please be fasting.

## 2023-12-29 NOTE — Assessment & Plan Note (Signed)
 Effective. Continue Montelukast  10mg  nightly.

## 2023-12-29 NOTE — Assessment & Plan Note (Signed)
 Stable. Continue Verapamil  240mg  daily. Will obtain CMP at next visit in February.

## 2023-12-29 NOTE — Assessment & Plan Note (Signed)
 Effective. Continue Meloxicam  7.5mg  tablet daily. Refilled medication.

## 2023-12-29 NOTE — Progress Notes (Signed)
 Established Patient Office Visit   Subjective:  Patient ID: Keith Castro, male    DOB: [birth year]  Age: 63 y.o. MRN: [id number]  Chief Complaint  Patient presents with   Medical Management of Chronic Issues    3 month follow up     HPI Hypothyroidism: Chronic. Patient is taking Levothyroxine  88mcg daily.  Lab Results  Component Value Date   TSH 2.21 10/15/2023    Osteoarthritis: Chronic. Patient is taking Meloxicam  7.5mg  every day. Effective.    HTN: Chronic. Patient is taking Verapamil  240mg  daily. Monitors it periodically. Denies CP, SHOB, HA, dizziness, and lower extremity edema. Based on previous PCP note, HTN predates since 2004.  BP Readings from Last 3 Encounters:  12/29/23 128/84  11/24/23 138/86  09/28/23 132/84     Seasonal and Environmental allergies: Chronic. Patient is taking Montelukast  10mg  at bedtime. Effective.   Smoking Cessation/Smoker: Patient is down to 1/2 pack or less from smoking 1.5 pack a day. He is applying Nicotine  14mg  patch daily on alternating arms daily. He reports he is not getting sick with patches and still smoking.  ROS See HPI above     Objective:   BP 128/84   Pulse 62   Temp 97.9 F (36.6 C) (Oral)   Ht 5' 10 (1.778 m)   Wt 189 lb (85.7 kg)   SpO2 98%   BMI 27.12 kg/m    Physical Exam Vitals reviewed.  Constitutional:      General: He is not in acute distress.    Appearance: Normal appearance. He is not ill-appearing, toxic-appearing or diaphoretic.  HENT:     Head: Normocephalic and atraumatic.  Eyes:     General:        Right eye: No discharge.        Left eye: No discharge.     Conjunctiva/sclera: Conjunctivae normal.  Cardiovascular:     Rate and Rhythm: Normal rate and regular rhythm.     Heart sounds: Normal heart sounds. No murmur heard.    No friction rub. No gallop.  Pulmonary:     Effort: Pulmonary effort is normal. No respiratory distress.     Breath sounds: Normal breath sounds.  Musculoskeletal:         General: Normal range of motion.  Skin:    General: Skin is warm and dry.  Neurological:     General: No focal deficit present.     Mental Status: He is alert and oriented to person, place, and time. Mental status is at baseline.  Psychiatric:        Mood and Affect: Mood normal.        Behavior: Behavior normal.        Thought Content: Thought content normal.        Judgment: Judgment normal.      Assessment & Plan:  Osteoarthritis, unspecified osteoarthritis type, unspecified site Assessment & Plan: Effective. Continue Meloxicam  7.5mg  tablet daily. Refilled medication.     Orders: -     Meloxicam ; Take 1 tablet (7.5 mg total) by mouth daily.  Dispense: 90 tablet; Refill: 1  Colon cancer screening -     Ambulatory referral to Gastroenterology  Environmental and seasonal allergies Assessment & Plan: Effective. Continue Montelukast  10mg  nightly.      Hypothyroidism, unspecified type Assessment & Plan: Stable. Continue Levothyroxine  88mcg daily. Will order TSH in February.      Primary hypertension Assessment & Plan: Stable. Continue Verapamil  240mg  daily. Will obtain CMP at next visit  in February.      Smoker Assessment & Plan: Currently smoking, but is decreasing amount. Down to 1/2 pack a day. Continue using Nicotine  14mg  daily patch.    1.Review health maintenance:  -Colonoscopy: Placed a referral to GI  -Covid vaccine: Declines  -Influenza vaccine: First week of October through employer  -PNA and Zoster: Declines  2.Follow up in February as already scheduled for a physical and please be fasting.    Emmanual Gauthreaux, NP

## 2023-12-29 NOTE — Assessment & Plan Note (Signed)
 Stable. Continue Levothyroxine  88mcg daily. Will order TSH in February.

## 2023-12-29 NOTE — Assessment & Plan Note (Signed)
 Currently smoking, but is decreasing amount. Down to 1/2 pack a day. Continue using Nicotine  14mg  daily patch.

## 2024-01-11 ENCOUNTER — Encounter: Payer: Self-pay | Admitting: Family Medicine

## 2024-01-11 ENCOUNTER — Other Ambulatory Visit: Payer: Self-pay

## 2024-01-11 DIAGNOSIS — E039 Hypothyroidism, unspecified: Secondary | ICD-10-CM

## 2024-01-11 MED ORDER — LEVOTHYROXINE SODIUM 88 MCG PO TABS: 88.0000 ug | ORAL_TABLET | Freq: Every day | ORAL | 1 refills | Status: AC

## 2024-01-29 ENCOUNTER — Other Ambulatory Visit: Payer: Self-pay | Admitting: Family Medicine

## 2024-03-04 ENCOUNTER — Other Ambulatory Visit: Payer: Self-pay | Admitting: Family Medicine

## 2024-03-10 ENCOUNTER — Encounter: Payer: 59 | Admitting: Internal Medicine

## 2024-03-25 ENCOUNTER — Encounter: Payer: Self-pay | Admitting: Internal Medicine

## 2024-03-30 ENCOUNTER — Encounter: Admitting: Family Medicine

## 2024-03-31 ENCOUNTER — Encounter: Admitting: Family Medicine
